# Patient Record
Sex: Female | Born: 1955
Health system: Southern US, Community
[De-identification: ages and names within clinical notes are randomized; demographics above are authoritative.]

## PROBLEM LIST (undated history)

## (undated) DIAGNOSIS — T7840XA Allergy, unspecified, initial encounter: Secondary | ICD-10-CM

## (undated) DIAGNOSIS — K219 Gastro-esophageal reflux disease without esophagitis: Secondary | ICD-10-CM

## (undated) DIAGNOSIS — IMO0002 Reserved for concepts with insufficient information to code with codable children: Secondary | ICD-10-CM

## (undated) DIAGNOSIS — D649 Anemia, unspecified: Secondary | ICD-10-CM

## (undated) DIAGNOSIS — E785 Hyperlipidemia, unspecified: Secondary | ICD-10-CM

## (undated) DIAGNOSIS — E079 Disorder of thyroid, unspecified: Secondary | ICD-10-CM

## (undated) DIAGNOSIS — R42 Dizziness and giddiness: Secondary | ICD-10-CM

## (undated) DIAGNOSIS — I10 Essential (primary) hypertension: Secondary | ICD-10-CM

## (undated) DIAGNOSIS — R87619 Unspecified abnormal cytological findings in specimens from cervix uteri: Secondary | ICD-10-CM

## (undated) HISTORY — DX: Unspecified abnormal cytological findings in specimens from cervix uteri: R87.619

## (undated) HISTORY — DX: Allergy, unspecified, initial encounter: T78.40XA

## (undated) HISTORY — PX: FOOT SURGERY: SHX648

## (undated) HISTORY — DX: Reserved for concepts with insufficient information to code with codable children: IMO0002

## (undated) HISTORY — DX: Essential (primary) hypertension: I10

## (undated) HISTORY — DX: Gastro-esophageal reflux disease without esophagitis: K21.9

## (undated) HISTORY — DX: Hyperlipidemia, unspecified: E78.5

## (undated) HISTORY — DX: Disorder of thyroid, unspecified: E07.9

## (undated) HISTORY — DX: Anemia, unspecified: D64.9

---

## 1978-12-15 HISTORY — PX: OVARY SURGERY: SHX727

## 1995-04-16 HISTORY — PX: ABDOMINAL HYSTERECTOMY: SHX81

## 2010-01-09 ENCOUNTER — Emergency Department (HOSPITAL_COMMUNITY)
Admission: EM | Admit: 2010-01-09 | Discharge: 2010-01-09 | Payer: Self-pay | Source: Home / Self Care | Admitting: Emergency Medicine

## 2011-06-01 ENCOUNTER — Encounter: Payer: Self-pay | Admitting: Family Medicine

## 2011-06-01 DIAGNOSIS — K219 Gastro-esophageal reflux disease without esophagitis: Secondary | ICD-10-CM

## 2011-06-01 DIAGNOSIS — Z889 Allergy status to unspecified drugs, medicaments and biological substances status: Secondary | ICD-10-CM | POA: Insufficient documentation

## 2011-06-01 DIAGNOSIS — I10 Essential (primary) hypertension: Secondary | ICD-10-CM

## 2011-06-01 DIAGNOSIS — E785 Hyperlipidemia, unspecified: Secondary | ICD-10-CM

## 2011-06-03 ENCOUNTER — Encounter: Payer: Self-pay | Admitting: Family Medicine

## 2011-06-03 ENCOUNTER — Ambulatory Visit (INDEPENDENT_AMBULATORY_CARE_PROVIDER_SITE_OTHER): Payer: 59 | Admitting: Family Medicine

## 2011-06-03 VITALS — BP 116/73 | HR 68 | Temp 97.9°F | Resp 16 | Ht 68.25 in | Wt 212.6 lb

## 2011-06-03 DIAGNOSIS — R1084 Generalized abdominal pain: Secondary | ICD-10-CM

## 2011-06-03 DIAGNOSIS — Z0000 Encounter for general adult medical examination without abnormal findings: Secondary | ICD-10-CM

## 2011-06-03 DIAGNOSIS — N951 Menopausal and female climacteric states: Secondary | ICD-10-CM

## 2011-06-03 DIAGNOSIS — H6980 Other specified disorders of Eustachian tube, unspecified ear: Secondary | ICD-10-CM

## 2011-06-03 DIAGNOSIS — R072 Precordial pain: Secondary | ICD-10-CM

## 2011-06-03 NOTE — Progress Notes (Signed)
  Subjective:    Patient ID: Stacy Bryan, female    DOB: 01-18-1956, 56 y.o.   MRN: [id number]  HPI Patient presents for CPE  Vertigo secondary to ETD; history of allergies with persistent PND   Review of Systems See health survey(to be scanned)    Objective:   Physical Exam  Constitutional: She is oriented to person, place, and time. She appears well-developed and well-nourished.  HENT:  Head: Normocephalic and atraumatic.  Right Ear: External ear normal.  Left Ear: External ear normal.  Nose: Mucosal edema and rhinorrhea present.  Eyes: Conjunctivae and EOM are normal. Pupils are equal, round, and reactive to light.  Neck: Neck supple. No thyromegaly present.  Cardiovascular: Normal rate, regular rhythm and normal heart sounds.   Pulmonary/Chest: Effort normal and breath sounds normal. Right breast exhibits no mass, no nipple discharge and no skin change. Left breast exhibits no mass, no nipple discharge and no skin change.  Abdominal: Soft. Bowel sounds are normal. There is no hepatosplenomegaly.  Musculoskeletal: Normal range of motion.  Neurological: She is alert and oriented to person, place, and time. She displays normal reflexes. No cranial nerve deficit.  Skin: Skin is warm.  Psychiatric: She has a normal mood and affect.          Assessment & Plan:  CPE  -Subclinical hyperthyroid -Chest pain; atypical -Epigastric-RUQ abdominal pain -Vertigo secondary to ETD -Allergies -Surgical menopause -Dyslipidemai -Gerd -FH breast cancer (2 ) sisters in their 23's  Plan/ EKG           Check labs           Strongly urged patient to get a mammogram given FH of breast cancer            In 2 first degree relatives. Patient agrees with recommendation.           Follow up with patient once labs are back.

## 2011-06-04 LAB — COMPREHENSIVE METABOLIC PANEL
ALT: 26 U/L (ref 0–35)
CO2: 28 mEq/L (ref 19–32)
Calcium: 9.3 mg/dL (ref 8.4–10.5)
Chloride: 102 mEq/L (ref 96–112)
Creat: 0.86 mg/dL (ref 0.50–1.10)
Glucose, Bld: 88 mg/dL (ref 70–99)

## 2011-06-04 LAB — CBC WITH DIFFERENTIAL/PLATELET
Eosinophils Relative: 6 % — ABNORMAL HIGH (ref 0–5)
HCT: 41.3 % (ref 36.0–46.0)
Hemoglobin: 13.2 g/dL (ref 12.0–15.0)
Lymphocytes Relative: 49 % — ABNORMAL HIGH (ref 12–46)
Lymphs Abs: 2.1 10*3/uL (ref 0.7–4.0)
MCV: 82.6 fL (ref 78.0–100.0)
Monocytes Absolute: 0.3 10*3/uL (ref 0.1–1.0)
Monocytes Relative: 7 % (ref 3–12)
Platelets: 281 10*3/uL (ref 150–400)
RBC: 5 MIL/uL (ref 3.87–5.11)
WBC: 4.2 10*3/uL (ref 4.0–10.5)

## 2011-06-04 LAB — LIPID PANEL
Cholesterol: 216 mg/dL — ABNORMAL HIGH (ref 0–200)
Total CHOL/HDL Ratio: 4.7 Ratio

## 2011-06-05 LAB — VITAMIN D 1,25 DIHYDROXY
Vitamin D2 1, 25 (OH)2: <8 pg/mL
Vitamin D3 1, 25 (OH)2: 49 pg/mL

## 2011-06-07 NOTE — Progress Notes (Signed)
Quick Note:  Please let patient know their labs were normal with the exception of her cholesterol. Patient to continue with their efforts to modify their diet and exercise 3-5 days a week. We will recheck lipids in 6 months. ______

## 2011-06-08 ENCOUNTER — Telehealth: Payer: Self-pay

## 2011-06-14 ENCOUNTER — Encounter: Payer: Self-pay | Admitting: Family Medicine

## 2011-07-01 ENCOUNTER — Ambulatory Visit (INDEPENDENT_AMBULATORY_CARE_PROVIDER_SITE_OTHER): Payer: 59 | Admitting: Family Medicine

## 2011-07-01 VITALS — BP 107/72 | HR 76 | Temp 98.3°F | Resp 18 | Ht 69.5 in | Wt 215.0 lb

## 2011-07-01 DIAGNOSIS — M545 Low back pain, unspecified: Secondary | ICD-10-CM

## 2011-07-01 MED ORDER — NAPROXEN 500 MG PO TABS: 500.0000 mg | ORAL_TABLET | Freq: Two times a day (BID) | ORAL | Status: AC

## 2011-07-01 MED ORDER — CYCLOBENZAPRINE HCL 5 MG PO TABS: 5.0000 mg | ORAL_TABLET | Freq: Three times a day (TID) | ORAL | Status: DC | PRN

## 2011-07-01 NOTE — Progress Notes (Signed)
  Subjective:    Patient ID: Stacy Bryan, female    DOB: 01-17-1956, 56 y.o.   MRN: [id number]  HPI 56 yo female with 2 weeks of back pain. Started after wearing high heels (doesn't ordinarily).  Lower pain.  NKI.  Won't go away.  There all the time, worse with standing, standing up, aand turning.  More on right than left.  Has been using flexeril at night, allow her to rest at night.  Stretching has seemed to make it worse.  Used heating pad first few nights.   Better with ibuprofen and flexeril.   No history of significant back problems but does hurt on occasion.    Review of Systems Negative except as per HPI     Objective:   Physical Exam  Constitutional: Vital signs are normal. She appears well-developed and well-nourished. She is active.  Non-toxic appearance. She does not appear ill.  Cardiovascular: Normal rate, regular rhythm, normal heart sounds and normal pulses.   Pulmonary/Chest: Effort normal and breath sounds normal.  Musculoskeletal:       Lumbar back: She exhibits tenderness and spasm. She exhibits normal range of motion, no bony tenderness, no swelling and no deformity.  Neurological: She is alert. She has normal strength. Gait normal.  Reflex Scores:      Patellar reflexes are 2+ on the right side and 2+ on the left side.      Achilles reflexes are 2+ on the right side and 2+ on the left side.         Assessment & Plan:  Low back pain, spasm - flexeril, naprosyn, heat, stretching.  If not improving in 1 week, then call and will refer to physical therapy.

## 2011-07-19 NOTE — Telephone Encounter (Signed)
Done in error.

## 2011-08-29 ENCOUNTER — Ambulatory Visit (INDEPENDENT_AMBULATORY_CARE_PROVIDER_SITE_OTHER): Payer: 59 | Admitting: Family Medicine

## 2011-08-29 VITALS — BP 124/83 | HR 77 | Temp 97.4°F | Resp 18 | Ht 69.0 in | Wt 213.0 lb

## 2011-08-29 DIAGNOSIS — J019 Acute sinusitis, unspecified: Secondary | ICD-10-CM

## 2011-08-29 MED ORDER — HYDROCODONE-HOMATROPINE 5-1.5 MG/5ML PO SYRP: 5.0000 mL | ORAL_SOLUTION | Freq: Three times a day (TID) | ORAL | Status: AC | PRN

## 2011-08-29 MED ORDER — FLUTICASONE PROPIONATE 50 MCG/ACT NA SUSP: 2.0000 | Freq: Every day | NASAL | Status: DC

## 2011-08-29 MED ORDER — AZITHROMYCIN 250 MG PO TABS: ORAL_TABLET | ORAL | Status: AC

## 2011-08-29 NOTE — Progress Notes (Signed)
  Subjective:    Patient ID: Stacy Bryan, female    DOB: 07/04/1955, 56 y.o.   MRN: [id number]  HPI 56 yo female with URI symptoms for 2 weeks.  Symptoms include head congestion, chills for last 3 days, difficulty sleeping due to coughing, sneezing.  Sore throat.  No ear symptoms.  Cough started dry but when she lays down coughs up phlegm - similar to nasal discharge.  Does have trouble with allergies.  Has used claritin D in teh past - no help.  [employer] brand helped until now.    Review of Systems Negative except as per HPI     Objective:   Physical Exam  Constitutional: She appears well-developed. No distress.  HENT:  Right Ear: Tympanic membrane, external ear and ear canal normal. Tympanic membrane is not injected, not scarred, not perforated, not erythematous, not retracted and not bulging.  Left Ear: Tympanic membrane, external ear and ear canal normal. Tympanic membrane is not injected, not scarred, not perforated, not erythematous, not retracted and not bulging.  Nose: Mucosal edema present. No rhinorrhea. Right sinus exhibits maxillary sinus tenderness and frontal sinus tenderness. Left sinus exhibits no maxillary sinus tenderness and no frontal sinus tenderness.  Mouth/Throat: Uvula is midline, oropharynx is clear and moist and mucous membranes are normal. No oropharyngeal exudate or tonsillar abscesses.  Cardiovascular: Normal rate, regular rhythm, normal heart sounds and intact distal pulses.   No murmur heard. Pulmonary/Chest: Effort normal and breath sounds normal. No respiratory distress. She has no wheezes. She has no rales.  Lymphadenopathy:       Head (right side): No submandibular and no preauricular adenopathy present.       Head (left side): No submandibular and no preauricular adenopathy present.       Right cervical: No superficial cervical and no posterior cervical adenopathy present.      Left cervical: No superficial cervical and no posterior cervical  adenopathy present.       Right: No supraclavicular adenopathy present.       Left: No supraclavicular adenopathy present.  Skin: Skin is warm and dry.          Assessment & Plan:  Sinusitis - flonase, zpak, hycodan.

## 2011-10-02 ENCOUNTER — Ambulatory Visit (INDEPENDENT_AMBULATORY_CARE_PROVIDER_SITE_OTHER): Payer: 59 | Admitting: Family Medicine

## 2011-10-02 ENCOUNTER — Ambulatory Visit: Payer: 59

## 2011-10-02 ENCOUNTER — Encounter: Payer: Self-pay | Admitting: Family Medicine

## 2011-10-02 VITALS — BP 110/77 | HR 72 | Temp 97.9°F | Resp 16 | Ht 68.0 in | Wt 213.0 lb

## 2011-10-02 DIAGNOSIS — M545 Low back pain, unspecified: Secondary | ICD-10-CM

## 2011-10-02 DIAGNOSIS — E785 Hyperlipidemia, unspecified: Secondary | ICD-10-CM

## 2011-10-02 DIAGNOSIS — E038 Other specified hypothyroidism: Secondary | ICD-10-CM | POA: Insufficient documentation

## 2011-10-02 DIAGNOSIS — E039 Hypothyroidism, unspecified: Secondary | ICD-10-CM

## 2011-10-02 LAB — T4, FREE: Free T4: 0.84 ng/dL (ref 0.80–1.80)

## 2011-10-02 NOTE — Patient Instructions (Signed)
Degenerative Disc Disease Degenerative disc disease is a condition caused by the changes that occur in the cushions of the backbone (spinal discs) as you grow older. Spinal discs are soft and compressible discs located between the bones of the spine (vertebrae). They act like shock absorbers. Degenerative disc disease can affect the wholespine. However, the neck and lower back are most commonly affected. Many changes can occur in the spinal discs with aging, such as:  The spinal discs may dry and shrink.   Small tears may occur in the tough, outer covering of the disc (annulus).   The disc space may become smaller due to loss of water.   Abnormal growths in the bone (spurs) may occur. This can put pressure on the nerve roots exiting the spinal canal, causing pain.   The spinal canal may become narrowed.  CAUSES  Degenerative disc disease is a condition caused by the changes that occur in the spinal discs with aging. The exact cause is not known, but there is a genetic basis for many patients. Degenerative changes can occur due to loss of fluid in the disc. This makes the disc thinner and reduces the space between the backbones. Small cracks can develop in the outer layer of the disc. This can lead to the breakdown of the disc. You are more likely to get degenerative disc disease if you are overweight. Smoking cigarettes and doing heavy work such as weightlifting can also increase your risk of this condition. Degenerative changes can start after a sudden injury. Growth of bone spurs can compress the nerve roots and cause pain.  SYMPTOMS  The symptoms vary from person to person. Some people may have no pain, while others have severe pain. The pain may be so severe that it can limit your activities. The location of the pain depends on the part of your backbone that is affected. You will have neck or arm pain if a disc in the neck area is affected. You will have pain in your back, buttocks, or legs if a  disc in the lower back is affected. The pain becomes worse while bending, reaching up, or with twisting movements. The pain may start gradually and then get worse as time passes. It may also start after a major or minor injury. You may feel numbness or tingling in the arms or legs.  DIAGNOSIS  Your caregiver will ask you about your symptoms and about activities or habits that may cause the pain. He or she may also ask about any injuries, diseases, ortreatments you have had earlier. Your caregiver will examine you to check for the range of movement that is possible in the affected area, to check for strength in your extremities, and to check for sensation in the areas of the arms and legs supplied by different nerve roots. An X-ray of the spine may be taken. Your caregiver may suggest other imaging tests, such as a computerized magnetic scan (MRI), if needed.  TREATMENT  Treatment includes rest, modifying your activities, and applying ice and heat. Your caregiver may prescribe medicines to reduce your pain and may ask you to do some exercises to strengthen your back. In some cases, you may need surgery. You and your caregiver will decide on the treatment that is best for you. HOME CARE INSTRUCTIONS   Follow proper lifting and walking techniques as advised by your caregiver.   Maintain good posture.   Exercise regularly as advised.   Perform relaxation exercises.   Change your sitting,   standing, and sleeping habits as advised. Change positions frequently.   Lose weight as advised.   Stop smoking if you smoke.   Wear supportive footwear.  SEEK MEDICAL CARE IF:  The pain does not go away within 1 to 4 weeks. SEEK IMMEDIATE MEDICAL CARE IF:   The pain is severe.   You notice weakness in your arms, hands, or legs.   You begin to lose control of your bladder or bowel.  MAKE SURE YOU:   Understand these instructions.   Will watch your condition.   Will get help right away if you are not  doing well or get worse.  Document Released: 01/27/2007 Document Revised: 03/21/2011 Document Reviewed: 01/27/2007 [employer] Patient Information 2012 [employer]. 

## 2011-10-02 NOTE — Progress Notes (Signed)
Subjective:    Patient ID: Stacy Bryan, female    DOB: 1955-10-27, 56 y.o.   MRN: [id number]  HPI This 56 y.o AA female has a hx of Subclinical Hypothyroidism , followed by physician in [place]  prior to first visit here with Dr. Hal Hope in Feb 2013. (her records are at [facility]). TFTs  have been "borderline". She has 2 sisters with thyroid disorder. Her symptoms include: fatigue despite adequate  sleep, skin changes, constipation and ankle swelling.         She also c/o pain down inside of R leg, onset after cardiac cath ~ 1 year ago. She has chronic LBP  mostly on right side/ posterior hip area with radiation into lower leg for 5+ years.  No hx of trauma but does recall a  fall at work years ago. Never had any xrays   Review of Systems  Constitutional: Positive for fatigue. Negative for fever, appetite change and unexpected weight change.  HENT: Negative for trouble swallowing, neck pain and voice change.   Respiratory: Negative for chest tightness and shortness of breath.   Cardiovascular: Positive for leg swelling. Negative for chest pain and palpitations.  Gastrointestinal: Positive for constipation. Negative for abdominal pain and blood in stool.  Musculoskeletal: Positive for back pain. Negative for joint swelling and arthralgias.  Neurological: Negative for dizziness, tremors, weakness, light-headedness, numbness and headaches.  Hematological: Negative.   Psychiatric/Behavioral: Negative for confusion, disturbed wake/sleep cycle and dysphoric mood. The patient is not nervous/anxious.        Objective:   Physical Exam  Nursing note and vitals reviewed. Constitutional: She is oriented to person, place, and time. She appears well-developed and well-nourished. No distress.  HENT:  Head: Normocephalic and atraumatic.  Right Ear: External ear normal.  Left Ear: External ear normal.  Nose: Nose normal.  Mouth/Throat: Oropharynx is clear and moist.    Eyes: Conjunctivae and EOM are normal. Pupils are equal, round, and reactive to light. No scleral icterus.  Neck: Normal range of motion. Neck supple. No thyromegaly present.  Cardiovascular: Normal rate, regular rhythm and normal heart sounds.  Exam reveals no gallop and no friction rub.   No murmur heard. Pulmonary/Chest: Effort normal and breath sounds normal. No respiratory distress.  Abdominal: She exhibits no mass. There is no tenderness. There is no guarding.  Musculoskeletal: Normal range of motion. She exhibits edema and tenderness.       Back: lumbosacral spine- tender R SI joint with palpation. SLR not performed - pt has vertigo when lying flat. When standing- forward flexion to 90 degrees without difficulty; cn bear weight on each leg for 5-8 seconds  Lymphadenopathy:    She has no cervical adenopathy.  Neurological: She is alert and oriented to person, place, and time. She displays abnormal reflex. No cranial nerve deficit. She exhibits normal muscle tone. Coordination normal.       DTRs: 0-1+ in UEs and LEs  Skin: Skin is warm and dry.  Psychiatric: She has a normal mood and affect. Her behavior is normal. Thought content normal.     LABS: 04/24/2011-  Free T4= 0.85-0.95     TSH= 0.195- 0.503   [facility] reading (PRIMARY) by  Dr. Audria Nine:  Lumbar spine- mild degenerative changes with spurring  involving lower lumbosacral spine; degenerative changes                                                                              of SI joints; disc spaces preserved; pronounced lordosis       Assessment & Plan:   1. Subclinical hypothyroidism  TSH, T4, Free  2. Low back pain - multifactorial (truncal obesity + poor core strength + Lumbar DDD DG Lumbar Spine 2-3 Views Await Radiology reading; consider PT and flexibility (ie Yoga or Pilates) and strength training to improve core muscles  3. Hyperlipidemia   Review Feb 2013 labs and advised nutrition modification

## 2011-10-05 ENCOUNTER — Other Ambulatory Visit: Payer: Self-pay | Admitting: Family Medicine

## 2011-10-05 DIAGNOSIS — E079 Disorder of thyroid, unspecified: Secondary | ICD-10-CM

## 2011-10-05 NOTE — Progress Notes (Signed)
Quick Note:  Please call pt and advise that the following labs are abnormal...  One of the thyroid test is a little abnormal. Best plan is to refer pt to Endocrinologist for further evaluation and best treatment going forward.  I have ordered a referral and she will be notified about an appointment.  Copy to pt.  ______

## 2011-11-13 ENCOUNTER — Ambulatory Visit: Payer: 59 | Admitting: Family Medicine

## 2012-02-27 DIAGNOSIS — Z6832 Body mass index (BMI) 32.0-32.9, adult: Secondary | ICD-10-CM | POA: Insufficient documentation

## 2012-02-27 DIAGNOSIS — Z9889 Other specified postprocedural states: Secondary | ICD-10-CM | POA: Insufficient documentation

## 2012-07-16 DIAGNOSIS — H8110 Benign paroxysmal vertigo, unspecified ear: Secondary | ICD-10-CM | POA: Insufficient documentation

## 2012-07-16 DIAGNOSIS — J302 Other seasonal allergic rhinitis: Secondary | ICD-10-CM | POA: Insufficient documentation

## 2012-09-28 ENCOUNTER — Other Ambulatory Visit: Payer: Self-pay | Admitting: Family Medicine

## 2012-09-28 NOTE — Telephone Encounter (Signed)
Needs OV, last seen June 2013

## 2012-09-29 ENCOUNTER — Other Ambulatory Visit: Payer: Self-pay | Admitting: *Deleted

## 2012-09-29 MED ORDER — FLUTICASONE PROPIONATE 50 MCG/ACT NA SUSP: 2.0000 | Freq: Every day | NASAL | Status: DC

## 2013-09-22 ENCOUNTER — Telehealth: Payer: Self-pay

## 2013-09-22 NOTE — Telephone Encounter (Signed)
Pt would like to set up an Appointment for Est. Care. Pt does have the [facility] card.

## 2013-09-29 ENCOUNTER — Telehealth: Payer: Self-pay | Admitting: Family Medicine

## 2013-09-29 NOTE — Telephone Encounter (Signed)
Left voicemail for patient to call to schedule appointment to establish care.

## 2013-10-08 ENCOUNTER — Ambulatory Visit: Payer: No Typology Code available for payment source | Attending: Internal Medicine | Admitting: Internal Medicine

## 2013-10-08 ENCOUNTER — Encounter: Payer: Self-pay | Admitting: Internal Medicine

## 2013-10-08 VITALS — BP 121/84 | HR 70 | Temp 98.3°F | Resp 16 | Ht 70.0 in | Wt 214.0 lb

## 2013-10-08 DIAGNOSIS — M545 Low back pain, unspecified: Secondary | ICD-10-CM | POA: Insufficient documentation

## 2013-10-08 DIAGNOSIS — Z1211 Encounter for screening for malignant neoplasm of colon: Secondary | ICD-10-CM

## 2013-10-08 DIAGNOSIS — Z1239 Encounter for other screening for malignant neoplasm of breast: Secondary | ICD-10-CM | POA: Insufficient documentation

## 2013-10-08 DIAGNOSIS — Z7189 Other specified counseling: Secondary | ICD-10-CM

## 2013-10-08 DIAGNOSIS — E785 Hyperlipidemia, unspecified: Secondary | ICD-10-CM | POA: Insufficient documentation

## 2013-10-08 DIAGNOSIS — Z79899 Other long term (current) drug therapy: Secondary | ICD-10-CM | POA: Insufficient documentation

## 2013-10-08 DIAGNOSIS — E038 Other specified hypothyroidism: Secondary | ICD-10-CM

## 2013-10-08 DIAGNOSIS — Z7689 Persons encountering health services in other specified circumstances: Secondary | ICD-10-CM | POA: Insufficient documentation

## 2013-10-08 DIAGNOSIS — Z7982 Long term (current) use of aspirin: Secondary | ICD-10-CM | POA: Insufficient documentation

## 2013-10-08 DIAGNOSIS — K219 Gastro-esophageal reflux disease without esophagitis: Secondary | ICD-10-CM | POA: Insufficient documentation

## 2013-10-08 DIAGNOSIS — Z87891 Personal history of nicotine dependence: Secondary | ICD-10-CM | POA: Insufficient documentation

## 2013-10-08 DIAGNOSIS — E039 Hypothyroidism, unspecified: Secondary | ICD-10-CM | POA: Insufficient documentation

## 2013-10-08 LAB — CBC WITH DIFFERENTIAL/PLATELET
BASOS PCT: 1 % (ref 0–1)
Basophils Absolute: 0 10*3/uL (ref 0.0–0.1)
EOS ABS: 0.3 10*3/uL (ref 0.0–0.7)
EOS PCT: 8 % — AB (ref 0–5)
HEMATOCRIT: 40.6 % (ref 36.0–46.0)
HEMOGLOBIN: 14 g/dL (ref 12.0–15.0)
LYMPHS ABS: 1.6 10*3/uL (ref 0.7–4.0)
Lymphocytes Relative: 43 % (ref 12–46)
MCH: 27.3 pg (ref 26.0–34.0)
MCHC: 34.5 g/dL (ref 30.0–36.0)
MCV: 79.3 fL (ref 78.0–100.0)
MONO ABS: 0.2 10*3/uL (ref 0.1–1.0)
MONOS PCT: 5 % (ref 3–12)
NEUTROS PCT: 43 % (ref 43–77)
Neutro Abs: 1.6 10*3/uL — ABNORMAL LOW (ref 1.7–7.7)
Platelets: 239 10*3/uL (ref 150–400)
RBC: 5.12 MIL/uL — AB (ref 3.87–5.11)
RDW: 14.7 % (ref 11.5–15.5)
WBC: 3.7 10*3/uL — ABNORMAL LOW (ref 4.0–10.5)

## 2013-10-08 LAB — POCT GLYCOSYLATED HEMOGLOBIN (HGB A1C): Hemoglobin A1C: 5.7

## 2013-10-08 MED ORDER — MELOXICAM 15 MG PO TABS: 15.0000 mg | ORAL_TABLET | Freq: Every day | ORAL | Status: DC

## 2013-10-08 MED ORDER — CYCLOBENZAPRINE HCL 5 MG PO TABS: 5.0000 mg | ORAL_TABLET | Freq: Three times a day (TID) | ORAL | Status: DC | PRN

## 2013-10-08 MED ORDER — HYDROCHLOROTHIAZIDE 12.5 MG PO CAPS: 12.5000 mg | ORAL_CAPSULE | Freq: Every day | ORAL | Status: DC

## 2013-10-08 NOTE — Progress Notes (Signed)
Pt here to establish care for Hx. Vertigo,HTN and lower back pain  Pt c/o intermit tailbone pain,nonradiating with worsening s/p fall at home after slipping out of bath tub in 06/2013 Pt didn't seek medical attention at that time Pain has been constant x 38mnth Pt need referral for Colonoscopy/mammogram Requesting advanced directive information Need blood work

## 2013-10-08 NOTE — Patient Instructions (Signed)
Back Pain, Adult Low back pain is very common. About 1 in 5 people have back pain.The cause of low back pain is rarely dangerous. The pain often gets better over time.About half of people with a sudden onset of back pain feel better in just 2 weeks. About 8 in 10 people feel better by 6 weeks.  CAUSES Some common causes of back pain include:  Strain of the muscles or ligaments supporting the spine.  Wear and tear (degeneration) of the spinal discs.  Arthritis.  Direct injury to the back. DIAGNOSIS Most of the time, the direct cause of low back pain is not known.However, back pain can be treated effectively even when the exact cause of the pain is unknown.Answering your caregiver's questions about your overall health and symptoms is one of the most accurate ways to make sure the cause of your pain is not dangerous. If your caregiver needs more information, he or she may order lab work or imaging tests (X-rays or MRIs).However, even if imaging tests show changes in your back, this usually does not require surgery. HOME CARE INSTRUCTIONS For many people, back pain returns.Since low back pain is rarely dangerous, it is often a condition that people can learn to manageon their own.   Remain active. It is stressful on the back to sit or stand in one place. Do not sit, drive, or stand in one place for more than 30 minutes at a time. Take short walks on level surfaces as soon as pain allows.Try to increase the length of time you walk each day.  Do not stay in bed.Resting more than 1 or 2 days can delay your recovery.  Do not avoid exercise or work.Your body is made to move.It is not dangerous to be active, even though your back may hurt.Your back will likely heal faster if you return to being active before your pain is gone.  Pay attention to your body when you bend and lift. Many people have less discomfortwhen lifting if they bend their knees, keep the load close to their bodies,and  avoid twisting. Often, the most comfortable positions are those that put less stress on your recovering back.  Find a comfortable position to sleep. Use a firm mattress and lie on your side with your knees slightly bent. If you lie on your back, put a pillow under your knees.  Only take over-the-counter or prescription medicines as directed by your caregiver. Over-the-counter medicines to reduce pain and inflammation are often the most helpful.Your caregiver may prescribe muscle relaxant drugs.These medicines help dull your pain so you can more quickly return to your normal activities and healthy exercise.  Put ice on the injured area.  Put ice in a plastic bag.  Place a towel between your skin and the bag.  Leave the ice on for 15-20 minutes, 03-04 times a day for the first 2 to 3 days. After that, ice and heat may be alternated to reduce pain and spasms.  Ask your caregiver about trying back exercises and gentle massage. This may be of some benefit.  Avoid feeling anxious or stressed.Stress increases muscle tension and can worsen back pain.It is important to recognize when you are anxious or stressed and learn ways to manage it.Exercise is a great option. SEEK MEDICAL CARE IF:  You have pain that is not relieved with rest or medicine.  You have pain that does not improve in 1 week.  You have new symptoms.  You are generally not feeling well. SEEK   IMMEDIATE MEDICAL CARE IF:   You have pain that radiates from your back into your legs.  You develop new bowel or bladder control problems.  You have unusual weakness or numbness in your arms or legs.  You develop nausea or vomiting.  You develop abdominal pain.  You feel faint. Document Released: 04/01/2005 Document Revised: 10/01/2011 Document Reviewed: 08/20/2010 [facility] Patient Information 2015 [place], [place]. This information is not intended to replace advice given to you by your health care provider. Make sure you  discuss any questions you have with your health care provider. DASH Eating Plan DASH stands for "Dietary Approaches to Stop Hypertension." The DASH eating plan is a healthy eating plan that has been shown to reduce high blood pressure (hypertension). Additional health benefits may include reducing the risk of type 2 diabetes mellitus, heart disease, and stroke. The DASH eating plan may also help with weight loss. WHAT DO I NEED TO KNOW ABOUT THE DASH EATING PLAN? For the DASH eating plan, you will follow these general guidelines:  Choose foods with a percent daily value for sodium of less than 5% (as listed on the food label).  Use salt-free seasonings or herbs instead of table salt or sea salt.  Check with your health care provider or pharmacist before using salt substitutes.  Eat lower-sodium products, often labeled as "lower sodium" or "no salt added."  Eat fresh foods.  Eat more vegetables, fruits, and low-fat dairy products.  Choose whole grains. Look for the word "whole" as the first word in the ingredient list.  Choose fish and skinless chicken or [place] more often than red meat. Limit fish, poultry, and meat to 6 oz (170 g) each day.  Limit sweets, desserts, sugars, and sugary drinks.  Choose heart-healthy fats.  Limit cheese to 1 oz (28 g) per day.  Eat more home-cooked food and less restaurant, buffet, and fast food.  Limit fried foods.  Cook foods using methods other than frying.  Limit canned vegetables. If you do use them, rinse them well to decrease the sodium.  When eating at a restaurant, ask that your food be prepared with less salt, or no salt if possible. WHAT FOODS CAN I EAT? Seek help from a dietitian for individual calorie needs. Grains Whole grain or whole wheat bread. Brown rice. Whole grain or whole wheat pasta. Quinoa, bulgur, and whole grain cereals. Low-sodium cereals. Corn or whole wheat flour tortillas. Whole grain cornbread. Whole grain crackers.  Low-sodium crackers. Vegetables Fresh or frozen vegetables (raw, steamed, roasted, or grilled). Low-sodium or reduced-sodium tomato and vegetable juices. Low-sodium or reduced-sodium tomato sauce and paste. Low-sodium or reduced-sodium canned vegetables.  Fruits All fresh, canned (in natural juice), or frozen fruits. Meat and Other Protein Products Ground beef (85% or leaner), grass-fed beef, or beef trimmed of fat. Skinless chicken or [place]. Ground chicken or [place]. Pork trimmed of fat. All fish and seafood. Eggs. Dried beans, peas, or lentils. Unsalted nuts and seeds. Unsalted canned beans. Dairy Low-fat dairy products, such as skim or 1% milk, 2% or reduced-fat cheeses, low-fat ricotta or cottage cheese, or plain low-fat yogurt. Low-sodium or reduced-sodium cheeses. Fats and Oils Tub margarines without trans fats. Light or reduced-fat mayonnaise and salad dressings (reduced sodium). Avocado. Safflower, olive, or canola oils. Natural peanut or almond butter. Other Unsalted popcorn and pretzels. The items listed above may not be a complete list of recommended foods or beverages. Contact your dietitian for more options. WHAT FOODS ARE NOT RECOMMENDED? Grains White bread.  White pasta. White rice. Refined cornbread. Bagels and croissants. Crackers that contain trans fat. Vegetables Creamed or fried vegetables. Vegetables in a cheese sauce. Regular canned vegetables. Regular canned tomato sauce and paste. Regular tomato and vegetable juices. Fruits Dried fruits. Canned fruit in light or heavy syrup. Fruit juice. Meat and Other Protein Products Fatty cuts of meat. Ribs, chicken wings, bacon, sausage, bologna, salami, chitterlings, fatback, hot dogs, bratwurst, and packaged luncheon meats. Salted nuts and seeds. Canned beans with salt. Dairy Whole or 2% milk, cream, half-and-half, and cream cheese. Whole-fat or sweetened yogurt. Full-fat cheeses or blue cheese. Nondairy creamers and whipped  toppings. Processed cheese, cheese spreads, or cheese curds. Condiments Onion and garlic salt, seasoned salt, table salt, and sea salt. Canned and packaged gravies. Worcestershire sauce. Tartar sauce. Barbecue sauce. Teriyaki sauce. Soy sauce, including reduced sodium. Steak sauce. Fish sauce. Oyster sauce. Cocktail sauce. Horseradish. Ketchup and mustard. Meat flavorings and tenderizers. Bouillon cubes. Hot sauce. Tabasco sauce. Marinades. Taco seasonings. Relishes. Fats and Oils Butter, stick margarine, lard, shortening, ghee, and bacon fat. Coconut, palm kernel, or palm oils. Regular salad dressings. Other Pickles and olives. Salted popcorn and pretzels. The items listed above may not be a complete list of foods and beverages to avoid. Contact your dietitian for more information. WHERE CAN I FIND MORE INFORMATION? National Heart, Lung, and Blood Institute: travelstabloid.com Document Released: 03/21/2011 Document Revised: 04/06/2013 Document Reviewed: 02/03/2013 [facility] Patient Information 2015 [place], [place]. This information is not intended to replace advice given to you by your health care provider. Make sure you discuss any questions you have with your health care provider.

## 2013-10-08 NOTE — Progress Notes (Signed)
Patient ID: Stacy Bryan, female   DOB: 12-18-1955, 58 y.o.   MRN: [id number]   Stacy Bryan, is a 58 y.o. female  [id number]  [id number]  DOB - June 27, 1955  CC:  Chief Complaint  Patient presents with  . Establish Care  . Back Pain  . Dizziness       HPI: Stacy Bryan is a 58 y.o. female here today to establish medical care. Very pleasant middle-age woman with history of subclinical hypothyroidism, dyslipidemia, leg edema and GERD, here today requesting for physical examination. She has no complaint or concern, the only concern is that she has not been to Dr. for a long time due to lack of insurance. Her last mammogram was more than 2 years ago, and her last colonoscopy was over 10 years. She had total abdominal hysterectomy over 20 years ago and was told she does not need Pap smear. She takes 12.5 mg of hydrochlorothiazide by mouth for occasionally leg swelling, she is to remain she has no personal history of hypertension or diabetes, she's not on medication for hypothyroidism, she only has regular thyroid function test, she quit smoking 6 years ago after about a 30-pack-year of smoking. She drinks alcohol occasionally. Patient fell in the bathtub her on March of this year and since then has had pain in her lower back. It is not radiating, repaired about 6/10, relieved by pain medication like Tylenol, aggravated by walking. No tingling or numbness. No urinary incontinence no fecal incontinence. Patient has No headache, No chest pain, No abdominal pain - No Nausea, No new weakness tingling or numbness, No Cough - SOB.  Not on File Past Medical History  Diagnosis Date  . Abnormal Pap smear   . Hypertension   . Thyroid disease   . Allergy   . Hyperlipidemia    Current Outpatient Prescriptions on File Prior to Visit  Medication Sig Dispense Refill  . aspirin 81 MG tablet Take 81 mg by mouth daily.      . calcium carbonate (TITRALAC) 420 MG CHEW Chew 420 mg by mouth.      .  fish oil-omega-3 fatty acids 1000 MG capsule Take 2 g by mouth daily.      . fluticasone (FLONASE) 50 MCG/ACT nasal spray Place 2 sprays into the nose daily. NEED VISIT!  16 g  0  . omeprazole (PRILOSEC) 20 MG capsule Take 20 mg by mouth daily.      . pravastatin (PRAVACHOL) 20 MG tablet Take 20 mg by mouth daily.       No current facility-administered medications on file prior to visit.   Family History  Problem Relation Age of Onset  . Hypertension Mother   . Heart disease Father   . Asthma Father   . Asthma Sister   . Diabetes Sister   . Heart disease Sister   . Gout Sister   . Ulcers Sister   . Cancer Maternal Grandmother    History   Social History  . Marital Status: Single    Spouse Name: N/A    Number of Children: N/A  . Years of Education: N/A   Occupational History  . Not on file.   Social History Main Topics  . Smoking status: Former Smoker    Quit date: 06/02/2006  . Smokeless tobacco: Not on file  . Alcohol Use: Yes     Comment: occassionally  . Drug Use: Not on file  . Sexual Activity: Not on file   Other Topics Concern  .  Not on file   Social History Narrative  . No narrative on file    Review of Systems: Constitutional: Negative for fever, chills, diaphoresis, activity change, appetite change and fatigue. HENT: Negative for ear pain, nosebleeds, congestion, facial swelling, rhinorrhea, neck pain, neck stiffness and ear discharge.  Eyes: Negative for pain, discharge, redness, itching and visual disturbance. Respiratory: Negative for cough, choking, chest tightness, shortness of breath, wheezing and stridor.  Cardiovascular: Negative for chest pain, palpitations and leg swelling. Gastrointestinal: Negative for abdominal distention. Genitourinary: Negative for dysuria, urgency, frequency, hematuria, flank pain, decreased urine volume, difficulty urinating and dyspareunia.  Musculoskeletal: Negative for back pain, joint swelling, arthralgia and gait  problem. Neurological: Negative for dizziness, tremors, seizures, syncope, facial asymmetry, speech difficulty, weakness, light-headedness, numbness and headaches.  Hematological: Negative for adenopathy. Does not bruise/bleed easily. Psychiatric/Behavioral: Negative for hallucinations, behavioral problems, confusion, dysphoric mood, decreased concentration and agitation.    Objective:   Filed Vitals:   10/08/13 1042  BP: 121/84  Pulse: 70  Temp: 98.3 F (36.8 C)  Resp: 16    Physical Exam: Constitutional: Patient appears well-developed and well-nourished. No distress. HENT: Normocephalic, atraumatic, External right and left ear normal. Oropharynx is clear and moist.  Eyes: Conjunctivae and EOM are normal. PERRLA, no scleral icterus. Neck: Normal ROM. Neck supple. No JVD. No tracheal deviation. No thyromegaly. CVS: RRR, S1/S2 +, no murmurs, no gallops, no carotid bruit.  Pulmonary: Effort and breath sounds normal, no stridor, rhonchi, wheezes, rales.  Abdominal: Soft. BS +, no distension, tenderness, rebound or guarding.  Musculoskeletal: Normal range of motion. No edema and no tenderness.  Lymphadenopathy: No lymphadenopathy noted, cervical, inguinal or axillary Neuro: Alert. Normal reflexes, muscle tone coordination. No cranial nerve deficit. Skin: Skin is warm and dry. No rash noted. Not diaphoretic. No erythema. No pallor. Psychiatric: Normal mood and affect. Behavior, judgment, thought content normal.  Lab Results  Component Value Date   WBC 4.2 06/03/2011   HGB 13.2 06/03/2011   HCT 41.3 06/03/2011   MCV 82.6 06/03/2011   PLT 281 06/03/2011   Lab Results  Component Value Date   CREATININE 0.86 06/03/2011   BUN 15 06/03/2011   NA 139 06/03/2011   K 3.8 06/03/2011   CL 102 06/03/2011   CO2 28 06/03/2011    No results found for this basename: HGBA1C   Lipid Panel     Component Value Date/Time   CHOL 216* 06/03/2011 1515   TRIG 170* 06/03/2011 1515   HDL 46 06/03/2011 1515    CHOLHDL 4.7 06/03/2011 1515   VLDL 34 06/03/2011 1515   LDLCALC 136* 06/03/2011 1515       Assessment and plan:   1. Other specified hypothyroidism  - TSH - T4, Free  2. Encounter to establish care  - HM COLONOSCOPY - Ambulatory referral to Gastroenterology - CBC with Differential - COMPLETE METABOLIC PANEL WITH GFR - POCT glycosylated hemoglobin (Hb A1C) - Lipid panel - Urinalysis, Complete  3. Midline low back pain without sciatica  - Vit D  25 hydroxy (rtn osteoporosis monitoring) - cyclobenzaprine (FLEXERIL) 5 MG tablet; Take 1 tablet (5 mg total) by mouth 3 (three) times daily as needed.  Dispense: 30 tablet; Refill: 1 - hydrochlorothiazide (MICROZIDE) 12.5 MG capsule; Take 1 capsule (12.5 mg total) by mouth daily.  Dispense: 30 capsule; Refill: 3 - meloxicam (MOBIC) 15 MG tablet; Take 1 tablet (15 mg total) by mouth daily.  Dispense: 30 tablet; Refill: 0  4. Breast cancer screening  -  MM DIGITAL SCREENING BILATERAL; Future  5. Colon cancer screening  - HM COLONOSCOPY - Ambulatory referral to Gastroenterology  Patient was counseled extensively about nutrition and exercise  Return in about 6 months (around 04/09/2014), or if symptoms worsen or fail to improve, for Routine Follow Up, Hypothyroidism.  The patient was given clear instructions to go to ER or return to medical center if symptoms don't improve, worsen or new problems develop. The patient verbalized understanding. The patient was told to call to get lab results if they haven't heard anything in the next week.     This note has been created with Surveyor, quantity. Any transcriptional errors are unintentional.    Angelica Chessman, MD, [place], [place], [place], [place]   10/08/2013, 11:33 AM

## 2013-10-09 LAB — COMPLETE METABOLIC PANEL WITH GFR
ALK PHOS: 85 U/L (ref 39–117)
ALT: 18 U/L (ref 0–35)
AST: 14 U/L (ref 0–37)
Albumin: 4.4 g/dL (ref 3.5–5.2)
BILIRUBIN TOTAL: 0.5 mg/dL (ref 0.2–1.2)
BUN: 17 mg/dL (ref 6–23)
CO2: 27 meq/L (ref 19–32)
CREATININE: 0.87 mg/dL (ref 0.50–1.10)
Calcium: 10 mg/dL (ref 8.4–10.5)
Chloride: 101 mEq/L (ref 96–112)
GFR, EST AFRICAN AMERICAN: 84 mL/min
GFR, EST NON AFRICAN AMERICAN: 73 mL/min
GLUCOSE: 98 mg/dL (ref 70–99)
Potassium: 3.8 mEq/L (ref 3.5–5.3)
Sodium: 137 mEq/L (ref 135–145)
TOTAL PROTEIN: 7.8 g/dL (ref 6.0–8.3)

## 2013-10-09 LAB — T4, FREE: Free T4: 0.82 ng/dL (ref 0.80–1.80)

## 2013-10-09 LAB — LIPID PANEL
Cholesterol: 247 mg/dL — ABNORMAL HIGH (ref 0–200)
HDL: 51 mg/dL (ref >39)
LDL CALC: 161 mg/dL — AB (ref 0–99)
TRIGLYCERIDES: 173 mg/dL — AB (ref <150)
Total CHOL/HDL Ratio: 4.8 Ratio
VLDL: 35 mg/dL (ref 0–40)

## 2013-10-09 LAB — URINALYSIS, COMPLETE
BILIRUBIN URINE: NEGATIVE
Bacteria, UA: NONE SEEN
CASTS: NONE SEEN
CRYSTALS: NONE SEEN
Glucose, UA: NEGATIVE mg/dL
Hgb urine dipstick: NEGATIVE
KETONES UR: NEGATIVE mg/dL
Leukocytes, UA: NEGATIVE
NITRITE: NEGATIVE
PH: 6.5 (ref 5.0–8.0)
Protein, ur: NEGATIVE mg/dL
SPECIFIC GRAVITY, URINE: 1.014 (ref 1.005–1.030)
SQUAMOUS EPITHELIAL / LPF: NONE SEEN
UROBILINOGEN UA: 0.2 mg/dL (ref 0.0–1.0)

## 2013-10-09 LAB — VITAMIN D 25 HYDROXY (VIT D DEFICIENCY, FRACTURES): Vit D, 25-Hydroxy: 58 ng/mL (ref 30–89)

## 2013-10-09 LAB — TSH: TSH: 0.495 u[IU]/mL (ref 0.350–4.500)

## 2013-10-13 ENCOUNTER — Encounter: Payer: Self-pay | Admitting: Internal Medicine

## 2013-10-21 ENCOUNTER — Telehealth: Payer: Self-pay

## 2013-10-21 NOTE — Telephone Encounter (Signed)
Called patient in reference to staff message left in in-box Patient not available

## 2013-10-28 ENCOUNTER — Telehealth: Payer: Self-pay

## 2013-10-28 NOTE — Telephone Encounter (Signed)
Message copied by Dorothe Pea on Thu Oct 28, 2013 10:21 AM ------      Message from: Angelica Chessman E      Created: Thu Oct 28, 2013  9:07 AM       Please inform patient that her laboratory tests results are mostly within normal limits except for her cholesterol level. We encouraged her to continue the medication for cholesterol and also regular physical exercise at least 3 times a week 30 minutes each time ------

## 2013-10-28 NOTE — Telephone Encounter (Signed)
Patient not available Left message on voice mail to return our call 

## 2013-11-17 ENCOUNTER — Telehealth: Payer: Self-pay | Admitting: Internal Medicine

## 2013-11-17 NOTE — Telephone Encounter (Signed)
Pt needs refill for Flonase  Please f/u with Pt

## 2013-11-30 ENCOUNTER — Other Ambulatory Visit: Payer: Self-pay | Admitting: *Deleted

## 2013-11-30 ENCOUNTER — Telehealth: Payer: Self-pay | Admitting: *Deleted

## 2013-11-30 MED ORDER — FLUTICASONE PROPIONATE 50 MCG/ACT NA SUSP: 2.0000 | Freq: Every day | NASAL | Status: DC

## 2013-11-30 NOTE — Telephone Encounter (Signed)
Left VM instructing the pt to pick up the RX at our pharmacy.

## 2013-12-08 ENCOUNTER — Ambulatory Visit: Payer: No Typology Code available for payment source | Admitting: Family Medicine

## 2013-12-09 ENCOUNTER — Ambulatory Visit: Payer: No Typology Code available for payment source | Admitting: Internal Medicine

## 2014-03-03 ENCOUNTER — Telehealth: Payer: Self-pay | Admitting: Emergency Medicine

## 2014-03-03 ENCOUNTER — Other Ambulatory Visit: Payer: Self-pay | Admitting: Internal Medicine

## 2014-03-03 ENCOUNTER — Other Ambulatory Visit: Payer: Self-pay | Admitting: Emergency Medicine

## 2014-03-03 MED ORDER — FLUTICASONE PROPIONATE 50 MCG/ACT NA SUSP: 2.0000 | Freq: Every day | NASAL | Status: DC

## 2014-03-03 NOTE — Telephone Encounter (Signed)
Pt comes in for Flonase refill Pt informed this will be the last time giving refill until seen by provider Last OV 10/08/13

## 2014-03-09 ENCOUNTER — Other Ambulatory Visit: Payer: Self-pay | Admitting: Internal Medicine

## 2014-03-17 ENCOUNTER — Ambulatory Visit: Payer: Self-pay | Admitting: Internal Medicine

## 2014-07-04 ENCOUNTER — Encounter: Payer: Self-pay | Admitting: Internal Medicine

## 2014-07-04 ENCOUNTER — Ambulatory Visit: Payer: Self-pay | Attending: Internal Medicine | Admitting: Internal Medicine

## 2014-07-04 VITALS — BP 136/85 | HR 59 | Temp 97.6°F | Resp 16 | Ht 70.0 in | Wt 220.0 lb

## 2014-07-04 DIAGNOSIS — Z1211 Encounter for screening for malignant neoplasm of colon: Secondary | ICD-10-CM

## 2014-07-04 DIAGNOSIS — J320 Chronic maxillary sinusitis: Secondary | ICD-10-CM

## 2014-07-04 DIAGNOSIS — Z0000 Encounter for general adult medical examination without abnormal findings: Secondary | ICD-10-CM

## 2014-07-04 DIAGNOSIS — M545 Low back pain, unspecified: Secondary | ICD-10-CM

## 2014-07-04 DIAGNOSIS — E785 Hyperlipidemia, unspecified: Secondary | ICD-10-CM

## 2014-07-04 MED ORDER — HYDROCHLOROTHIAZIDE 12.5 MG PO CAPS: 12.5000 mg | ORAL_CAPSULE | Freq: Every day | ORAL | Status: DC

## 2014-07-04 MED ORDER — SIMVASTATIN 20 MG PO TABS: 20.0000 mg | ORAL_TABLET | Freq: Every day | ORAL | Status: DC

## 2014-07-04 MED ORDER — FLUTICASONE PROPIONATE 50 MCG/ACT NA SUSP: 2.0000 | Freq: Every day | NASAL | Status: DC

## 2014-07-04 MED ORDER — CYCLOBENZAPRINE HCL 5 MG PO TABS: 5.0000 mg | ORAL_TABLET | Freq: Three times a day (TID) | ORAL | Status: DC

## 2014-07-04 NOTE — Patient Instructions (Signed)
Groin Strain °A groin strain (also called a groin pull) is an injury to the muscles or tendon on the upper inner part of the thigh. These muscles are called the adductor muscles or groin muscles. They are responsible for moving the leg across the body. A muscle strain occurs when a muscle is overstretched and some muscle fibers are torn. A groin strain can range from mild to severe depending on how many muscle fibers are affected and whether the muscle fibers are partially or completely torn.  °Groin strains usually occur during exercise or participation in sports. The injury often happens when a sudden, violent force is placed on a muscle, stretching the muscle too far. A strain is more likely to occur when your muscles are not warmed up or if you are not properly conditioned. Depending on the severity of the groin strain, recovery time may vary from a few weeks to several weeks. Severe injuries often require 4-6 weeks for recovery. In these cases, complete healing can take 4-5 months.  °CAUSES  °· Stretching the groin muscles too far or too suddenly, often during side-to-side motion with an abrupt change in direction. °· Putting repeated stress on the groin muscles over a long period of time. °· Performing vigorous activity without properly stretching the groin muscles beforehand. °SYMPTOMS  °· Pain and tenderness in the groin area. This begins as sharp pain and persists as a dull ache. °· Popping or snapping feeling when the injury occurs (for severe strains). °· Swelling or bruising. °· Muscle spasms. °· Weakness in the leg. °· Stiffness in the groin area with decreased ability to move the affected muscles. °DIAGNOSIS  °Your caregiver will perform a physical exam to diagnose a groin strain. You will be asked about your symptoms and how the injury occurred. X-rays are sometimes needed to rule out a broken bone or cartilage problems. Your caregiver may order a CT scan or MRI if a complete muscle tear is  suspected. °TREATMENT  °A groin strain will often heal on its own. Your caregiver may prescribe medicines to help manage pain and swelling (anti-inflammatory medicine). You may be told to use crutches for the first few days to minimize your pain. °HOME CARE INSTRUCTIONS  °· Rest. Do not use the strained muscle if it causes pain. °· Put ice on the injured area. °¨ Put ice in a plastic bag. °¨ Place a towel between your skin and the bag. °¨ Leave the ice on for 15-20 minutes, every 2-3 hours. Do this for the first 2 days after the injury.  °· Only take over-the-counter or prescription medicines as directed by your caregiver. °· Wrap the injured area with an elastic bandage as directed by your caregiver. °· Keep the injured leg raised (elevated). °· Walk, stretch, and perform range-of-motion exercises to improve blood flow to the injured area. Only perform these activities if you can do so without any pain. °To prevent muscle strains: °· Warm up before exercise. °· Develop proper conditioning and strength in the groin muscles. °SEEK IMMEDIATE MEDICAL CARE IF:  °· You have increased pain or swelling in the affected area.   °· Your symptoms are not improving or are getting worse. °MAKE SURE YOU:  °· Understand these instructions. °· Will watch your condition. °· Will get help right away if you are not doing well or get worse. °Document Released: 11/28/2003 Document Revised: 03/18/2012 Document Reviewed: 12/04/2011 °Exit[employer]® Patient Information ©2015 [employer]. This information is not intended to replace advice given to you   by your health care provider. Make sure you discuss any questions you have with your health care provider. ° °

## 2014-07-04 NOTE — Progress Notes (Signed)
Patient ID: [name], female   DOB: Feb 15, 1956, 59 y.o.   MRN: [id number]   Venus Gilles, is a 59 y.o. female  QMG:[id number]  [id number]  DOB - 07/26/1955  Chief Complaint  Patient presents with  . Follow-up  . Medication Refill  . Numbness        Subjective:   Sayde Lish is a 59 y.o. female here today for a follow up visit. Patient has history of subclinical hypothyroidism, dyslipidemia, GERD and chronic leg edema. She is here today for her routine follow-up and medication refills. She is complaining of feeling nonspecific groin pain radiating to her leg ongoing for about 3 months and right side. She describes the pain as pulling, no history of trauma or fall. Both legs still have some edema. She denies any new complaint today except as above. She is due for mammogram, colonoscopy and Pap smear. Patient has No headache, No chest pain, No abdominal pain - No Nausea, No new weakness tingling or numbness, No Cough - SOB.  Problem  Preventative Health Care  Chronic Maxillary Sinusitis    ALLERGIES: Not on File  PAST MEDICAL HISTORY: Past Medical History  Diagnosis Date  . Abnormal Pap smear   . Hypertension   . Thyroid disease   . Allergy   . Hyperlipidemia     MEDICATIONS AT HOME: Prior to Admission medications   Medication Sig Start Date End Date Taking? Authorizing Provider  aspirin 81 MG tablet Take 81 mg by mouth daily.   Yes Historical Provider, MD  calcium carbonate (TITRALAC) 420 MG CHEW Chew 420 mg by mouth.   Yes Historical Provider, MD  cyclobenzaprine (FLEXERIL) 5 MG tablet Take 1 tablet (5 mg total) by mouth 3 (three) times daily. 07/04/14  Yes Tresa Garter, MD  fish oil-omega-3 fatty acids 1000 MG capsule Take 2 g by mouth daily.   Yes Historical Provider, MD  fluticasone (FLONASE) 50 MCG/ACT nasal spray Place 2 sprays into both nostrils daily. NEED VISIT! 07/04/14  Yes Tresa Garter, MD  hydrochlorothiazide (MICROZIDE) 12.5 MG  capsule Take 1 capsule (12.5 mg total) by mouth daily. 07/04/14  Yes Tresa Garter, MD  omeprazole (PRILOSEC) 20 MG capsule Take 20 mg by mouth daily.   Yes Historical Provider, MD  meloxicam (MOBIC) 15 MG tablet TAKE 1 TABLET BY MOUTH DAILY. Patient not taking: Reported on 07/04/2014 04/05/14   Tresa Garter, MD  pravastatin (PRAVACHOL) 20 MG tablet Take 20 mg by mouth daily.    Historical Provider, MD  simvastatin (ZOCOR) 20 MG tablet Take 1 tablet (20 mg total) by mouth at bedtime. 07/04/14   Tresa Garter, MD     Objective:   Filed Vitals:   07/04/14 1017  BP: 136/85  Pulse: 59  Temp: 97.6 F (36.4 C)  TempSrc: Oral  Resp: 16  Height: 5\' 10"  (1.778 m)  Weight: 220 lb (99.791 kg)  SpO2: 97%    Exam General appearance : Awake, alert, not in any distress. Speech Clear. Not toxic looking HEENT: Atraumatic and Normocephalic, pupils equally reactive to light and accomodation Neck: supple, no JVD. No cervical lymphadenopathy.  Chest:Good air entry bilaterally, no added sounds  CVS: S1 S2 regular, no murmurs.  Abdomen: Bowel sounds present, Non tender and not distended with no gaurding, rigidity or rebound. Extremities: B/L Lower Ext shows no edema, both legs are warm to touch Neurology: Awake alert, and oriented X 3, CN II-XII intact, Non focal Skin:No Rash  Data Review  Lab Results  Component Value Date   HGBA1C 5.7 10/08/2013     Assessment & Plan   1. Midline low back pain without sciatica  - cyclobenzaprine (FLEXERIL) 5 MG tablet; Take 1 tablet (5 mg total) by mouth 3 (three) times daily.  Dispense: 30 tablet; Refill: 3 - hydrochlorothiazide (MICROZIDE) 12.5 MG capsule; Take 1 capsule (12.5 mg total) by mouth daily.  Dispense: 30 capsule; Refill: 3  2. Colon cancer screening  - HM COLONOSCOPY - Ambulatory referral to Gastroenterology for Colonoscopy  3. Preventative health care  - MM Digital Screening; Future - HM COLONOSCOPY - Ambulatory  referral to Gastroenterology for Colonoscopy  4. Dyslipidemia  - simvastatin (ZOCOR) 20 MG tablet; Take 1 tablet (20 mg total) by mouth at bedtime.  Dispense: 90 tablet; Refill: 3  To address this please limit saturated fat to no more than 7% of your calories, limit cholesterol to 200 mg/day, increase fiber and exercise as tolerated. If needed we may add another cholesterol lowering medication to your regimen.   5. Chronic maxillary sinusitis  - fluticasone (FLONASE) 50 MCG/ACT nasal spray; Place 2 sprays into both nostrils daily. NEED VISIT!  Dispense: 16 g; Refill: 3  Patient is to schedule Pap smear with our female provider.  Patient have been counseled extensively about nutrition and exercise Return in about 6 months (around 01/04/2015), or if symptoms worsen or fail to improve, for Follow up Pain and comorbidities, Routine Follow Up.  The patient was given clear instructions to go to ER or return to medical center if symptoms don't improve, worsen or new problems develop. The patient verbalized understanding. The patient was told to call to get lab results if they haven't heard anything in the next week.   This note has been created with Surveyor, quantity. Any transcriptional errors are unintentional.    Angelica Chessman, MD, [place], [place], [place], [place] and [facility] [place], [place]   07/04/2014, 11:11 AM

## 2014-07-04 NOTE — Progress Notes (Signed)
Pt here to f/u with medication refills  C/o right groin pulling pain radiating to leg x 3 mnths  States the pain getting worse over the last month  Swelling noted in both feet. Denies chest pain or sob Declined flu vaccine Health Maintenance Pap smear Mammogram Possible colonoscopy

## 2014-07-25 ENCOUNTER — Other Ambulatory Visit: Payer: Self-pay | Admitting: Internal Medicine

## 2014-11-09 ENCOUNTER — Telehealth: Payer: Self-pay | Admitting: Internal Medicine

## 2014-11-09 NOTE — Telephone Encounter (Signed)
Pt has a rash on top of right shoulder and is the size of a silver dollar and is round and red. The rash has been going on for about 4 days. Please follow up with pt for advise as we do not have any appt soon. Thank you.

## 2014-11-09 NOTE — Telephone Encounter (Signed)
Patient reports having vertigo for 20 years. Has not had any occurences recently. Currently on day 3 of vertigo. Patient has rash on right shoulder, round a little bigger than a silver dollar. Rash started 3-4 days ago, started out small and is getting larger. Patient denies fever.  Patient scheduled for appointment to be seen at [facility] tomorrow morning at 9am.

## 2014-11-10 ENCOUNTER — Encounter: Payer: Self-pay | Admitting: Family Medicine

## 2014-11-10 ENCOUNTER — Ambulatory Visit: Payer: 59 | Attending: Family Medicine | Admitting: Family Medicine

## 2014-11-10 VITALS — BP 119/81 | HR 61 | Temp 97.6°F | Resp 18 | Ht 70.0 in | Wt 217.0 lb

## 2014-11-10 DIAGNOSIS — B354 Tinea corporis: Secondary | ICD-10-CM | POA: Insufficient documentation

## 2014-11-10 DIAGNOSIS — E785 Hyperlipidemia, unspecified: Secondary | ICD-10-CM | POA: Insufficient documentation

## 2014-11-10 DIAGNOSIS — Z79899 Other long term (current) drug therapy: Secondary | ICD-10-CM | POA: Diagnosis not present

## 2014-11-10 DIAGNOSIS — H8113 Benign paroxysmal vertigo, bilateral: Secondary | ICD-10-CM | POA: Diagnosis not present

## 2014-11-10 DIAGNOSIS — Z87891 Personal history of nicotine dependence: Secondary | ICD-10-CM | POA: Insufficient documentation

## 2014-11-10 DIAGNOSIS — I10 Essential (primary) hypertension: Secondary | ICD-10-CM | POA: Insufficient documentation

## 2014-11-10 DIAGNOSIS — R42 Dizziness and giddiness: Secondary | ICD-10-CM | POA: Diagnosis not present

## 2014-11-10 DIAGNOSIS — R21 Rash and other nonspecific skin eruption: Secondary | ICD-10-CM | POA: Diagnosis present

## 2014-11-10 DIAGNOSIS — K219 Gastro-esophageal reflux disease without esophagitis: Secondary | ICD-10-CM | POA: Diagnosis not present

## 2014-11-10 DIAGNOSIS — Z7982 Long term (current) use of aspirin: Secondary | ICD-10-CM | POA: Diagnosis not present

## 2014-11-10 DIAGNOSIS — E079 Disorder of thyroid, unspecified: Secondary | ICD-10-CM | POA: Diagnosis not present

## 2014-11-10 MED ORDER — MECLIZINE HCL 32 MG PO TABS
32.0000 mg | ORAL_TABLET | Freq: Three times a day (TID) | ORAL | Status: DC | PRN
Start: 2014-11-10 — End: 2014-11-10

## 2014-11-10 MED ORDER — CLOTRIMAZOLE 1 % EX CREA: 1.0000 "application " | TOPICAL_CREAM | Freq: Two times a day (BID) | CUTANEOUS | Status: DC

## 2014-11-10 MED ORDER — MECLIZINE HCL 25 MG PO TABS: 25.0000 mg | ORAL_TABLET | Freq: Three times a day (TID) | ORAL | Status: DC | PRN

## 2014-11-10 NOTE — Progress Notes (Signed)
Patient here for vertigo, says she is better today, but cannot make any sudden moves. Patient reports current vertigo episode started Monday and has not gone away yet.  Most of her vertigo episodes last a few days, if they are bad they will last 1-2 weeks.  Patient also reports rash on right shoulder that has been present for 4 days.  Patient states it started off as the size of a penny/nickel, and has gotten worse. Patient states it is tender and itches.  Patient reports using OTC hydrocortisone cream for the itch, patient states it helps but she is using it all day.  Patient reports Flonase triggering vertigo episodes. Patient also has bilateral feet swelling for the past months.  Patient reports Dr. Doreene Burke has assessed this issue in the past.

## 2014-11-10 NOTE — Progress Notes (Signed)
Subjective:    Patient ID: Stacy Bryan, female    DOB: 1955/08/29, 59 y.o.   MRN: [id number]  HPI 59 year old female patient with a history of hypertension, GERD, seasonal allergies who comes in for an acute visit complaining of a rash on her right shoulder for the last 4 days which is itchy and has increased in size. Denies the presence of rash in other body areas and also denies recent introduction of new soaps or creams  She also complains of vertigo for the last 1-2 weeks; she has a chronic history of vertigo which resolved a while ago but has now returned and seems to worsen with the use of Flonase. Symptoms have worsened to the fact that she has not been able to go out over the last 2 days. Denies hearing loss, ear pain. Of note she does suffer from seasonal allergies; she denies the presence of fever.  Past Medical History  Diagnosis Date  . Abnormal Pap smear   . Hypertension   . Thyroid disease   . Allergy   . Hyperlipidemia     Past Surgical History  Procedure Laterality Date  . Foot surgery      both feet  20 yrs ago  . Abdominal hysterectomy  1997    total  . Ovary surgery  1980's    removal of R ovary (cyst)    History   Social History  . Marital Status: Single    Spouse Name: N/A  . Number of Children: N/A  . Years of Education: N/A   Occupational History  . Not on file.   Social History Main Topics  . Smoking status: Former Smoker    Quit date: 06/02/2006  . Smokeless tobacco: Not on file  . Alcohol Use: No     Comment: occassionally  . Drug Use: No  . Sexual Activity: Not on file   Other Topics Concern  . Not on file   Social History Narrative    Not on File  Current Outpatient Prescriptions on File Prior to Visit  Medication Sig Dispense Refill  . aspirin 81 MG tablet Take 81 mg by mouth daily.    . calcium carbonate (TITRALAC) 420 MG CHEW Chew 420 mg by mouth.    . cyclobenzaprine (FLEXERIL) 5 MG tablet Take 1 tablet (5 mg total)  by mouth 3 (three) times daily. 30 tablet 3  . fish oil-omega-3 fatty acids 1000 MG capsule Take 2 g by mouth daily.    . hydrochlorothiazide (MICROZIDE) 12.5 MG capsule Take 1 capsule (12.5 mg total) by mouth daily. 30 capsule 3  . meloxicam (MOBIC) 15 MG tablet TAKE 1 TABLET BY MOUTH DAILY. 30 tablet 0  . pravastatin (PRAVACHOL) 20 MG tablet Take 20 mg by mouth daily.    . fluticasone (FLONASE) 50 MCG/ACT nasal spray Place 2 sprays into both nostrils daily. NEED VISIT! (Patient not taking: Reported on 11/10/2014) 16 g 3  . omeprazole (PRILOSEC) 20 MG capsule Take 20 mg by mouth daily.    . simvastatin (ZOCOR) 20 MG tablet Take 1 tablet (20 mg total) by mouth at bedtime. (Patient not taking: Reported on 11/10/2014) 90 tablet 3   No current facility-administered medications on file prior to visit.    Review of Systems  Constitutional: Negative for activity change and appetite change.  HENT: Negative for ear discharge, ear pain and sore throat.        Positive for vertigo.  Respiratory: Negative for chest tightness, shortness of  breath and wheezing.   Cardiovascular: Positive for leg swelling (Pedal edema). Negative for chest pain.  Gastrointestinal: Negative for abdominal pain, constipation and abdominal distention.  Genitourinary: Negative.   Skin:       See history of present illness  Psychiatric/Behavioral: Negative for behavioral problems and dysphoric mood.       Objective: Filed Vitals:   11/10/14 0906  BP: 119/81  Pulse: 61  Temp: 97.6 F (36.4 C)  TempSrc: Oral  Resp: 18  Height: 5\' 10"  (1.778 m)  Weight: 217 lb (98.431 kg)  SpO2: 99%      Physical Exam  Constitutional: She is oriented to person, place, and time. She appears well-developed and well-nourished.  Cardiovascular: Normal rate, normal heart sounds and intact distal pulses.   No murmur heard. Pulmonary/Chest: Effort normal and breath sounds normal. She has no wheezes. She has no rales. She exhibits no  tenderness.  Abdominal: Soft. Bowel sounds are normal. She exhibits no distension and no mass. There is no tenderness.  Musculoskeletal: Normal range of motion. She exhibits edema (1+ nonpitting pedal edema, left greater than right.).  Neurological: She is alert and oriented to person, place, and time.  Skin:  Right shoulder with well Circumscribed tenia rash with mild erythema and central clearing measuring 5 x 5 cm.          Assessment & Plan:  59 year old female patient with a history of hypertension, GERD, seasonal allergies with vertigo and rash and shoulder suspicious for tinea infection.  Tinea corporis: Placed on topical antifungal Advised against sharing clothing.  Vertigo: Due to recurrent nature I'm referring her for vestibular exercises meanwhile I will place her on meclizine. Will need to follow-up with PCP with respect to monitoring of symptoms.  HCM: Scheduled for mammogram next week as well as colonoscopy.  This note has been created with Surveyor, quantity. Any transcriptional errors are unintentional.

## 2014-12-14 ENCOUNTER — Ambulatory Visit (HOSPITAL_COMMUNITY): Payer: 59

## 2014-12-16 ENCOUNTER — Ambulatory Visit (HOSPITAL_COMMUNITY)
Admission: RE | Admit: 2014-12-16 | Discharge: 2014-12-16 | Disposition: A | Discharge status: Home / Self Care | Payer: 59 | Source: Ambulatory Visit | Attending: Internal Medicine | Admitting: Internal Medicine

## 2014-12-16 DIAGNOSIS — Z0000 Encounter for general adult medical examination without abnormal findings: Secondary | ICD-10-CM

## 2014-12-26 ENCOUNTER — Telehealth: Payer: Self-pay | Admitting: Internal Medicine

## 2014-12-26 NOTE — Telephone Encounter (Signed)
Pt. Would like to see if a Bilateral Diagnostic mammogram and ultrasound can  be sent to [facility].Marland KitchenMarland KitchenMarland KitchenMarland Kitchenplease f/u

## 2015-01-13 ENCOUNTER — Telehealth: Payer: Self-pay | Admitting: *Deleted

## 2015-01-13 DIAGNOSIS — N644 Mastodynia: Secondary | ICD-10-CM

## 2015-01-13 NOTE — Telephone Encounter (Signed)
Pt calling to follow up on previous call regarding mammogram referral.

## 2015-01-13 NOTE — Telephone Encounter (Signed)
Patient verified DOB Patient states she was told to schedule a mammogram with [facility]. Upon arriving to appointment pateint shared she was having concerns with breast. Worker stated she needed a diagnostic instead of screening. Patient called [facility] to have diagnostic completed and was told GI needed doctors referral. Medical assistant informed patient of routing message to provider in order to receive referral approval or any action to take otherwise. Patient also asked about Gastro referral which was placed on the same visit. Medical assistant reviewed notes which stated Gastro office was unable to reach patient on 3/30. Medical assistant gave patient Loistine Chance contact number and OV reference to call and schedule appointment. Patient made aware of receiving a phone call next week in response to Mammogram referral.

## 2015-01-23 NOTE — Telephone Encounter (Signed)
Patient called stating she needs a referral to take a mammogram. Patient stated she talked to the [facility] about it. Please f/u with pt.

## 2015-01-24 NOTE — Telephone Encounter (Signed)
Patient needs Diagnostic [facility] referral. Do you approve this referral? If so, I will send the order in.

## 2015-01-25 NOTE — Telephone Encounter (Signed)
Patient called to check on the status of her mammogram referral. Please f/u

## 2015-01-31 ENCOUNTER — Other Ambulatory Visit: Payer: Self-pay | Admitting: Internal Medicine

## 2015-01-31 DIAGNOSIS — Z1231 Encounter for screening mammogram for malignant neoplasm of breast: Secondary | ICD-10-CM

## 2015-02-01 ENCOUNTER — Other Ambulatory Visit: Payer: Self-pay | Admitting: Internal Medicine

## 2015-02-01 NOTE — Telephone Encounter (Signed)
Medical Assistant spoke with patient regarding soreness in left breast. Per Dr. Jarold Song patient may be scheduled for a Diagnostic Mammogram. Medical Assistant contacted [facility] and scheduled patients appointment. Patient has been made aware of appointment being scheduled for   Medical Assistant contacted patient to specify the site of soreness in left breast. Medical Assistant had to leave a message stating to return a phone call to [place] at [facility]

## 2015-02-01 NOTE — Telephone Encounter (Signed)
Pt. Returned call. Please f/u with pt. °

## 2015-02-01 NOTE — Telephone Encounter (Signed)
Patient verified DOB Patient stated soreness in her left breast is located at position 12 o'clock near her nipple. Patient made aware of appointment being scheduled for Tuesday October 25,2016 at 10:10am. Patient needs to bring images from 2011 [facility].  Medical Assistant provided patient with Facility Number to contact for images, as well as, contact number for [facility] and location. Patient is aware and expressed her understanding. Patient had no further questions.

## 2015-02-07 ENCOUNTER — Ambulatory Visit
Admission: RE | Admit: 2015-02-07 | Discharge: 2015-02-07 | Disposition: A | Discharge status: Home / Self Care | Payer: 59 | Source: Ambulatory Visit | Attending: Family Medicine | Admitting: Family Medicine

## 2015-02-07 DIAGNOSIS — N644 Mastodynia: Secondary | ICD-10-CM

## 2015-02-09 ENCOUNTER — Telehealth: Payer: Self-pay

## 2015-02-09 NOTE — Telephone Encounter (Signed)
CMA called pt and pt verified name and DOB. Pt was given lab results and verbalized that she understood with no further questions.

## 2015-02-09 NOTE — Telephone Encounter (Signed)
-----   Message from Arnoldo Morale, MD sent at 02/08/2015  3:36 PM EDT ----- Please inform the patient that mammogram is normal. Thank you.

## 2015-03-24 NOTE — Telephone Encounter (Signed)
Patient saw Dr. Jarold Song on 7/28 and according to notes this was addressed at this visit and patient was supposed to get her mammogram.  No calls from The [facility] asking for orders.Marland KitchenMarland KitchenMarland Kitchen

## 2015-04-18 ENCOUNTER — Other Ambulatory Visit: Payer: Self-pay | Admitting: Internal Medicine

## 2015-05-01 MED FILL — ?HYDROCHLOROTHIAZIDE 12.5MG: 12.5 | 30 days supply | Qty: 30 | Fill #0

## 2015-05-16 ENCOUNTER — Encounter: Payer: Self-pay | Admitting: Podiatry

## 2015-05-16 ENCOUNTER — Ambulatory Visit (INDEPENDENT_AMBULATORY_CARE_PROVIDER_SITE_OTHER): Payer: 59 | Admitting: Podiatry

## 2015-05-16 ENCOUNTER — Ambulatory Visit (INDEPENDENT_AMBULATORY_CARE_PROVIDER_SITE_OTHER): Payer: 59

## 2015-05-16 VITALS — BP 115/67 | HR 61 | Resp 16 | Ht 70.0 in | Wt 220.0 lb

## 2015-05-16 DIAGNOSIS — Q828 Other specified congenital malformations of skin: Secondary | ICD-10-CM | POA: Diagnosis not present

## 2015-05-16 DIAGNOSIS — M2042 Other hammer toe(s) (acquired), left foot: Secondary | ICD-10-CM

## 2015-05-16 DIAGNOSIS — M779 Enthesopathy, unspecified: Secondary | ICD-10-CM

## 2015-05-16 DIAGNOSIS — M778 Other enthesopathies, not elsewhere classified: Secondary | ICD-10-CM

## 2015-05-16 DIAGNOSIS — M7752 Other enthesopathy of left foot: Secondary | ICD-10-CM | POA: Diagnosis not present

## 2015-05-16 NOTE — Progress Notes (Signed)
   Subjective:    Patient ID: Stacy Bryan, female    DOB: Oct 11, 1955, 60 y.o.   MRN: [id number]  HPI : she presents today with chief complaint of pain to the lateral aspect of her third digit PIPJ left foot. States that had a lot of work done on these feet and hammertoes but this one still seems to be bothering me.    Review of Systems  Constitutional: Positive for diaphoresis and fatigue.  Musculoskeletal: Positive for myalgias.  Neurological: Positive for dizziness.  All other systems reviewed and are negative.      Objective:   Physical Exam : vital signs stable alert and oriented 3 no apparent distress. Pulses are intact. Neurologic sensorium is intact. Deep tendon reflexes are intact. Muscle strength is 5 over 5 dorsiflexors plantar flexors inverters and everters all edges of musculature is intact. Orthopedic evaluation of his resolved joints distal to the ankle for range of motion without crepitation. She has adductovarus rotated toe deformity she also has hammertoe deformities hallux valgus deformities also noted all of this combined in a tight area resulting in reactive hyperkeratosis and pain on palpation of PIPJ third digit left foot. The hyperkeratosis is to the lateral aspect of the PIPJ and is punctate.        Assessment & Plan:   assessment: Capsulitis porokeratosis third digit left foot.  Plan: I injected the area today with dexamethasone and local anesthetic to alleviate capsulitis and the bursitis beneath the reactive hyperkeratotic lesion. Also debrided the reactive hyperkeratotic lesion for her and I will follow-up with her on an as-needed basis .

## 2015-06-05 ENCOUNTER — Emergency Department (HOSPITAL_COMMUNITY): Payer: 59

## 2015-06-05 ENCOUNTER — Encounter (HOSPITAL_COMMUNITY): Payer: Self-pay | Admitting: Nurse Practitioner

## 2015-06-05 ENCOUNTER — Emergency Department (HOSPITAL_COMMUNITY)
Admission: EM | Admit: 2015-06-05 | Discharge: 2015-06-05 | Disposition: A | Discharge status: Home / Self Care | Payer: 59 | Attending: Emergency Medicine | Admitting: Emergency Medicine

## 2015-06-05 DIAGNOSIS — Z7982 Long term (current) use of aspirin: Secondary | ICD-10-CM | POA: Diagnosis not present

## 2015-06-05 DIAGNOSIS — R51 Headache: Secondary | ICD-10-CM | POA: Insufficient documentation

## 2015-06-05 DIAGNOSIS — R42 Dizziness and giddiness: Secondary | ICD-10-CM | POA: Diagnosis present

## 2015-06-05 DIAGNOSIS — Z79899 Other long term (current) drug therapy: Secondary | ICD-10-CM | POA: Diagnosis not present

## 2015-06-05 DIAGNOSIS — E785 Hyperlipidemia, unspecified: Secondary | ICD-10-CM | POA: Diagnosis not present

## 2015-06-05 DIAGNOSIS — I10 Essential (primary) hypertension: Secondary | ICD-10-CM | POA: Diagnosis not present

## 2015-06-05 DIAGNOSIS — H6592 Unspecified nonsuppurative otitis media, left ear: Secondary | ICD-10-CM | POA: Insufficient documentation

## 2015-06-05 DIAGNOSIS — R1010 Upper abdominal pain, unspecified: Secondary | ICD-10-CM | POA: Diagnosis not present

## 2015-06-05 DIAGNOSIS — Z87891 Personal history of nicotine dependence: Secondary | ICD-10-CM | POA: Insufficient documentation

## 2015-06-05 DIAGNOSIS — R197 Diarrhea, unspecified: Secondary | ICD-10-CM | POA: Insufficient documentation

## 2015-06-05 DIAGNOSIS — R112 Nausea with vomiting, unspecified: Secondary | ICD-10-CM | POA: Diagnosis not present

## 2015-06-05 DIAGNOSIS — H6692 Otitis media, unspecified, left ear: Secondary | ICD-10-CM

## 2015-06-05 LAB — URINALYSIS, ROUTINE W REFLEX MICROSCOPIC
BILIRUBIN URINE: NEGATIVE
GLUCOSE, UA: NEGATIVE mg/dL
Hgb urine dipstick: NEGATIVE
KETONES UR: NEGATIVE mg/dL
LEUKOCYTES UA: NEGATIVE
NITRITE: NEGATIVE
PH: 8 (ref 5.0–8.0)
PROTEIN: NEGATIVE mg/dL
Specific Gravity, Urine: 1.01 (ref 1.005–1.030)

## 2015-06-05 LAB — COMPREHENSIVE METABOLIC PANEL
ALK PHOS: 88 U/L (ref 38–126)
ALT: 23 U/L (ref 14–54)
ANION GAP: 7 (ref 5–15)
AST: 21 U/L (ref 15–41)
Albumin: 3.9 g/dL (ref 3.5–5.0)
BILIRUBIN TOTAL: 0.7 mg/dL (ref 0.3–1.2)
BUN: 17 mg/dL (ref 6–20)
CALCIUM: 9.4 mg/dL (ref 8.9–10.3)
CO2: 26 mmol/L (ref 22–32)
Chloride: 108 mmol/L (ref 101–111)
Creatinine, Ser: 0.69 mg/dL (ref 0.44–1.00)
GFR calc Af Amer: >60 mL/min (ref >60)
GLUCOSE: 133 mg/dL — AB (ref 65–99)
POTASSIUM: 4.1 mmol/L (ref 3.5–5.1)
Sodium: 141 mmol/L (ref 135–145)
TOTAL PROTEIN: 7.4 g/dL (ref 6.5–8.1)

## 2015-06-05 LAB — LIPASE, BLOOD: LIPASE: 29 U/L (ref 11–51)

## 2015-06-05 LAB — CBC WITH DIFFERENTIAL/PLATELET
Basophils Absolute: 0 10*3/uL (ref 0.0–0.1)
Basophils Relative: 0 %
EOS PCT: 1 %
Eosinophils Absolute: 0 10*3/uL (ref 0.0–0.7)
HEMATOCRIT: 41.6 % (ref 36.0–46.0)
Hemoglobin: 13.5 g/dL (ref 12.0–15.0)
LYMPHS ABS: 1.2 10*3/uL (ref 0.7–4.0)
LYMPHS PCT: 27 %
MCH: 27.2 pg (ref 26.0–34.0)
MCHC: 32.5 g/dL (ref 30.0–36.0)
MCV: 83.7 fL (ref 78.0–100.0)
Monocytes Absolute: 0.1 10*3/uL (ref 0.1–1.0)
Monocytes Relative: 3 %
NEUTROS ABS: 3 10*3/uL (ref 1.7–7.7)
Neutrophils Relative %: 69 %
PLATELETS: 270 10*3/uL (ref 150–400)
RBC: 4.97 MIL/uL (ref 3.87–5.11)
RDW: 13.5 % (ref 11.5–15.5)
WBC: 4.3 10*3/uL (ref 4.0–10.5)

## 2015-06-05 MED ORDER — SODIUM CHLORIDE 0.9 % IV BOLUS (SEPSIS)
1000.0000 mL | Freq: Once | INTRAVENOUS | Status: AC
  Administered 2015-06-05: 1000 mL via INTRAVENOUS

## 2015-06-05 MED ORDER — ONDANSETRON HCL 4 MG/2ML IJ SOLN
4.0000 mg | Freq: Once | INTRAMUSCULAR | Status: AC
  Administered 2015-06-05: 4 mg via INTRAVENOUS
  Filled 2015-06-05: qty 2

## 2015-06-05 MED ORDER — AMOXICILLIN 500 MG PO CAPS
500.0000 mg | ORAL_CAPSULE | Freq: Once | ORAL | Status: AC
Start: 2015-06-05 — End: 2015-06-05
  Administered 2015-06-05: 500 mg via ORAL
  Filled 2015-06-05: qty 1

## 2015-06-05 MED ORDER — AMOXICILLIN 500 MG PO CAPS: 500.0000 mg | ORAL_CAPSULE | Freq: Three times a day (TID) | ORAL | Status: DC

## 2015-06-05 MED ORDER — MECLIZINE HCL 25 MG PO TABS: 25.0000 mg | ORAL_TABLET | Freq: Four times a day (QID) | ORAL | Status: DC | PRN

## 2015-06-05 MED ORDER — MECLIZINE HCL 25 MG PO TABS
50.0000 mg | ORAL_TABLET | Freq: Once | ORAL | Status: AC
  Administered 2015-06-05: 50 mg via ORAL
  Filled 2015-06-05: qty 2

## 2015-06-05 MED ORDER — DIAZEPAM 5 MG/ML IJ SOLN
5.0000 mg | Freq: Once | INTRAMUSCULAR | Status: AC
  Administered 2015-06-05: 5 mg via INTRAVENOUS
  Filled 2015-06-05: qty 2

## 2015-06-05 NOTE — Discharge Instructions (Signed)
Benign Positional Vertigo °Vertigo is the feeling that you or your surroundings are moving when they are not. Benign positional vertigo is the most common form of vertigo. The cause of this condition is not serious (is benign). This condition is triggered by certain movements and positions (is positional). This condition can be dangerous if it occurs while you are doing something that could endanger you or others, such as driving.  °CAUSES °In many cases, the cause of this condition is not known. It may be caused by a disturbance in an area of the inner ear that helps your brain to sense movement and balance. This disturbance can be caused by a viral infection (labyrinthitis), head injury, or repetitive motion. °RISK FACTORS °This condition is more likely to develop in: °· Women. °· People who are 50 years of age or older. °SYMPTOMS °Symptoms of this condition usually happen when you move your head or your eyes in different directions. Symptoms may start suddenly, and they usually last for less than a minute. Symptoms may include: °· Loss of balance and falling. °· Feeling like you are spinning or moving. °· Feeling like your surroundings are spinning or moving. °· Nausea and vomiting. °· Blurred vision. °· Dizziness. °· Involuntary eye movement (nystagmus). °Symptoms can be mild and cause only slight annoyance, or they can be severe and interfere with daily life. Episodes of benign positional vertigo may return (recur) over time, and they may be triggered by certain movements. Symptoms may improve over time. °DIAGNOSIS °This condition is usually diagnosed by medical history and a physical exam of the head, neck, and ears. You may be referred to a health care provider who specializes in ear, nose, and throat (ENT) problems (otolaryngologist) or a provider who specializes in disorders of the nervous system (neurologist). You may have additional testing, including: °· MRI. °· A CT scan. °· Eye movement tests. Your  health care provider may ask you to change positions quickly while he or she watches you for symptoms of benign positional vertigo, such as nystagmus. Eye movement may be tested with an electronystagmogram (ENG), caloric stimulation, the Dix-Hallpike test, or the roll test. °· An electroencephalogram (EEG). This records electrical activity in your brain. °· Hearing tests. °TREATMENT °Usually, your health care provider will treat this by moving your head in specific positions to adjust your inner ear back to normal. Surgery may be needed in severe cases, but this is rare. In some cases, benign positional vertigo may resolve on its own in 2-4 weeks. °HOME CARE INSTRUCTIONS °Safety °· Move slowly. Avoid sudden body or head movements. °· Avoid driving. °· Avoid operating heavy machinery. °· Avoid doing any tasks that would be dangerous to you or others if a vertigo episode would occur. °· If you have trouble walking or keeping your balance, try using a cane for stability. If you feel dizzy or unstable, sit down right away. °· Return to your normal activities as told by your health care provider. Ask your health care provider what activities are safe for you. °General Instructions °· Take over-the-counter and prescription medicines only as told by your health care provider. °· Avoid certain positions or movements as told by your health care provider. °· Drink enough fluid to keep your urine clear or pale yellow. °· Keep all follow-up visits as told by your health care provider. This is important. °SEEK MEDICAL CARE IF: °· You have a fever. °· Your condition gets worse or you develop new symptoms. °· Your family or friends   notice any behavioral changes.  Your nausea or vomiting gets worse.  You have numbness or a "pins and needles" sensation. SEEK IMMEDIATE MEDICAL CARE IF:  You have difficulty speaking or moving.  You are always dizzy.  You faint.  You develop severe headaches.  You have weakness in your  legs or arms.  You have changes in your hearing or vision.  You develop a stiff neck.  You develop sensitivity to light.   This information is not intended to replace advice given to you by your health care provider. Make sure you discuss any questions you have with your health care provider.   Document Released: 01/07/2006 Document Revised: 12/21/2014 Document Reviewed: 07/25/2014 Elsevier Interactive Patient Education 2016 [place].  Otitis Media, Adult Otitis media is redness, soreness, and puffiness (swelling) in the space just behind your eardrum (middle ear). It may be caused by allergies or infection. It often happens along with a cold. HOME CARE  Take your medicine as told. Finish it even if you start to feel better.  Only take over-the-counter or prescription medicines for pain, discomfort, or fever as told by your doctor.  Follow up with your doctor as told. GET HELP IF:  You have otitis media only in one ear, or bleeding from your nose, or both.  You notice a lump on your neck.  You are not getting better in 3-5 days.  You feel worse instead of better. GET HELP RIGHT AWAY IF:   You have pain that is not helped with medicine.  You have puffiness, redness, or pain around your ear.  You get a stiff neck.  You cannot move part of your face (paralysis).  You notice that the bone behind your ear hurts when you touch it. MAKE SURE YOU:   Understand these instructions.  Will watch your condition.  Will get help right away if you are not doing well or get worse.   This information is not intended to replace advice given to you by your health care provider. Make sure you discuss any questions you have with your health care provider.   Document Released: 09/18/2007 Document Revised: 04/22/2014 Document Reviewed: 10/27/2012 Elsevier Interactive Patient Education [employer].

## 2015-06-05 NOTE — ED Provider Notes (Signed)
CSN: [id number]     Arrival date & time 06/05/15  1157 History   First MD Initiated Contact with Patient 06/05/15 1229     Chief Complaint  Patient presents with  . Emesis  . Dizziness  . Abdominal Distention      (Consider location/radiation/quality/duration/timing/severity/associated sxs/prior Treatment) HPI Comments: Turned over this AM around 3, room spinning Made way to bathroom and threw up Open eyes, everything spinning Nausea severe   Patient is a 60 y.o. female presenting with vomiting and dizziness.  Emesis Associated symptoms: abdominal pain (mild "soreness" upper abdomen), diarrhea (r temple, been there since this AM, progressively worse) and headaches   Associated symptoms: no sore throat   Dizziness Quality:  Room spinning and vertigo Severity:  Severe Onset quality:  Sudden Duration:  10 hours Timing:  Constant Progression:  Waxing and waning Relieved by:  Closing eyes and being still Worsened by:  Eye movement and turning head Associated symptoms: diarrhea (r temple, been there since this AM, progressively worse), headaches, nausea and vomiting   Associated symptoms: no chest pain, no shortness of breath, no syncope, no tinnitus and no weakness   Risk factors: hx of vertigo (never this severe)   Risk factors: no new medications     Past Medical History  Diagnosis Date  . Abnormal Pap smear   . Hypertension   . Thyroid disease   . Allergy   . Hyperlipidemia    Past Surgical History  Procedure Laterality Date  . Foot surgery      both feet  20 yrs ago  . Abdominal hysterectomy  1997    total  . Ovary surgery  1980's    removal of R ovary (cyst)   Family History  Problem Relation Age of Onset  . Hypertension Mother   . Heart disease Father   . Asthma Father   . Asthma Sister   . Diabetes Sister   . Heart disease Sister   . Gout Sister   . Ulcers Sister   . Cancer Maternal Grandmother    Social History  Substance Use Topics  . Smoking  status: Former Smoker    Quit date: 06/02/2006  . Smokeless tobacco: None  . Alcohol Use: No     Comment: occassionally   OB History    No data available     Review of Systems  Constitutional: Negative for fever.  HENT: Positive for ear pain (left). Negative for congestion, sore throat and tinnitus.   Eyes: Negative for visual disturbance.  Respiratory: Negative for cough and shortness of breath.   Cardiovascular: Negative for chest pain and syncope.  Gastrointestinal: Positive for nausea, vomiting, abdominal pain (mild "soreness" upper abdomen) and diarrhea (r temple, been there since this AM, progressively worse).  Genitourinary: Negative for difficulty urinating.  Musculoskeletal: Negative for back pain and neck pain.  Skin: Negative for rash.  Neurological: Positive for dizziness and headaches. Negative for syncope, facial asymmetry, speech difficulty, weakness, light-headedness and numbness.      Allergies  Review of patient's allergies indicates no known allergies.  Home Medications   Prior to Admission medications   Medication Sig Start Date End Date Taking? Authorizing Provider  acetaminophen (TYLENOL) 500 MG tablet Take 1,000 mg by mouth every 6 (six) hours as needed for mild pain.   Yes Historical Provider, MD  aspirin EC 81 MG tablet Take 81 mg by mouth daily.   Yes Historical Provider, MD  Biotin w/ Vitamins C & E (HAIR SKIN &  NAILS GUMMIES PO) Take 2 capsules by mouth daily.   Yes Historical Provider, MD  calcium carbonate (TITRALAC) 420 MG CHEW Chew 420 mg by mouth 2 (two) times daily.    Yes Historical Provider, MD  Cholecalciferol (VITAMIN D-3 PO) Take 600 Units by mouth 2 (two) times daily.   Yes Historical Provider, MD  cyclobenzaprine (FLEXERIL) 5 MG tablet Take 1 tablet (5 mg total) by mouth 3 (three) times daily. Patient taking differently: Take 5 mg by mouth daily as needed for muscle spasms.  07/04/14  Yes Tresa Garter, MD  fish oil-omega-3 fatty  acids 1000 MG capsule Take 2 g by mouth daily.   Yes Historical Provider, MD  hydrochlorothiazide (MICROZIDE) 12.5 MG capsule Take 1 capsule (12.5 mg total) by mouth daily. Need office visit for more refills 04/18/15  Yes Olugbemiga E Doreene Burke, MD  magnesium oxide (MAG-OX) 400 MG tablet Take 400 mg by mouth 2 (two) times daily.   Yes Historical Provider, MD  Zinc Sulfate (ZINC 15 PO) Take 15 mg by mouth 2 (two) times daily.   Yes Historical Provider, MD   BP 118/75 mmHg  Pulse 69  Temp(Src) 97.6 F (36.4 C) (Oral)  Resp 14  SpO2 97% Physical Exam  Constitutional: She is oriented to person, place, and time. She appears well-developed and well-nourished. No distress.  HENT:  Head: Normocephalic and atraumatic.  Right Ear: Tympanic membrane is not injected.  Left Ear: Tympanic membrane is bulging.  Mouth/Throat: No tonsillar abscesses.  No skew Head impulse testing with inability to focus when head turning to right  Eyes: Conjunctivae and EOM are normal. Pupils are equal, round, and reactive to light.  Neck: Normal range of motion.  Cardiovascular: Normal rate, regular rhythm, normal heart sounds and intact distal pulses.  Exam reveals no gallop and no friction rub.   No murmur heard. Pulmonary/Chest: Effort normal and breath sounds normal. No respiratory distress. She has no wheezes. She has no rales.  Abdominal: Soft. She exhibits no distension. There is no tenderness. There is no guarding and no CVA tenderness.  Musculoskeletal: She exhibits no edema or tenderness.  Neurological: She is alert and oriented to person, place, and time. She has normal strength. No cranial nerve deficit or sensory deficit. Coordination normal. Abnormal gait: deferred given severe vertigo. GCS eye subscore is 4. GCS verbal subscore is 5. GCS motor subscore is 6.  Skin: Skin is warm and dry. No rash noted. She is not diaphoretic. No erythema.  Nursing note and vitals reviewed.   ED Course  Procedures (including  critical care time) Labs Review Labs Reviewed  COMPREHENSIVE METABOLIC PANEL - Abnormal; Notable for the following:    Glucose, Bld 133 (*)    All other components within normal limits  URINE CULTURE  CBC WITH DIFFERENTIAL/PLATELET  LIPASE, BLOOD  URINALYSIS, ROUTINE W REFLEX MICROSCOPIC (NOT AT [facility])    Imaging Review Ct Head Wo Contrast  06/05/2015  CLINICAL DATA:  Vertigo; dizziness; n/v; rt temporal ha; since 0300; hx htn; vertigo EXAM: CT HEAD WITHOUT CONTRAST TECHNIQUE: Contiguous axial images were obtained from the base of the skull through the vertex without intravenous contrast. COMPARISON:  None. FINDINGS: Brain: Mild parenchymal atrophy. Patchy areas of hypoattenuation in deep and periventricular white matter bilaterally. Negative for acute intracranial hemorrhage, mass lesion, acute infarction, midline shift, or mass-effect. Acute infarct may be inapparent on noncontrast CT. Ventricles and sulci symmetric. Vascular: No hyperdense vessel or unexpected calcification. Skull: Negative for fracture or focal lesion.  Sinuses/Orbits: No acute findings. Other: None. IMPRESSION: 1.  Negative for bleed or other acute intracranial process. 2. Atrophy and nonspecific white matter changes. Electronically Signed   By: Lucrezia Europe M.D.   On: 06/05/2015 14:28   I have personally reviewed and evaluated these images and lab results as part of my medical decision-making.   EKG Interpretation   Date/Time:  Monday June 05 2015 14:07:56 EST Ventricular Rate:  63 PR Interval:  188 QRS Duration: 72 QT Interval:  424 QTC Calculation: 434 R Axis:   43 Text Interpretation:  Sinus rhythm Probable left atrial enlargement Low  voltage, extremity and precordial leads Baseline wander in lead(s) III No  previous ECGs available Confirmed by [facility] MD, Shaya Altamura ([id number]) on  06/05/2015 4:15:51 PM      MDM   Final diagnoses:  Vertigo  Acute left otitis media, recurrence not specified, unspecified  otitis media type   60yo female with history of htn, vertigo, hyperlipidemia presents with concern for dizziness and nausea.  Differential diagnosis for dizziness includes central causes such as stroke, intracranial bleed, mass and peripheral causes such as BPPV, meniere's disease, viral.  Vertigo is positional, patient has normal neurologic exam, including normal coordination, and have low suspicion for central cause of vertigo by history and physical exam.  Patient does have signs of otitis media on the left.  Patient has headache and CT head was done which showed no acute abnormalities.  Headache began slowly and doubt SAH.  Patient also reported some abdominal pain in triage, however exam is benign, labs are WNL, and is likely secondary to emesis with vertigo her primary concern.   Patient given NS, zofran, meclizine 50mg  with continuing vertigo worse with movements and standing. She is unable to ambulate normally likely secondary to sensation of vertigo.  Will give 5mg  of valium, and if patient is improved and able to ambulate, will plan on discharge with rx for otitis media and vertigo.  If she is unable to ambulate, would plan on admission for further evaluation and care.  Care transferred to oncoming provider, Dr. Wilson Singer with reevaluation pending.   Gareth Morgan, MD 06/05/15 1622

## 2015-06-05 NOTE — ED Notes (Signed)
Per EMS, Pt, from home, c/o emesis, lower abdominal distention, and dizziness starting this morning.  Denies pain.  Hx of vertigo, HTN, and thyroid disease.

## 2015-06-05 NOTE — ED Provider Notes (Signed)
Assumed care in signout. Patient reports that she still is somewhat symptomatic although significantly improved from presentation. She was able to ambulate to the bathroom without assistance. She feels comfortable going home at this point. Will discharge with when necessary meclizine. Amoxicillin for otitis media. Return precautions were discussed.  Virgel Manifold, MD 06/08/15 [phone]

## 2015-06-05 NOTE — ED Notes (Signed)
Bed: [id number] Expected date:  Expected time:  Means of arrival:  Comments: EMS- 60yo F, dizziness/Hx of vertigo

## 2015-06-06 LAB — URINE CULTURE

## 2015-06-07 ENCOUNTER — Encounter: Payer: Self-pay | Admitting: Internal Medicine

## 2015-06-12 NOTE — Telephone Encounter (Signed)
Patient needs a refill for meclizine (ANTIVERT) 25 MG tablet [id number] .Marland KitchenMarland Kitchenpatient states my chart message was sent to nurse....please follow up with patient.  Patient has appt schedule 3/2  @ 945pm with PCP

## 2015-06-15 ENCOUNTER — Ambulatory Visit: Payer: 59 | Admitting: Internal Medicine

## 2015-08-31 ENCOUNTER — Other Ambulatory Visit: Payer: Self-pay | Admitting: Internal Medicine

## 2015-08-31 DIAGNOSIS — E0590 Thyrotoxicosis, unspecified without thyrotoxic crisis or storm: Secondary | ICD-10-CM

## 2015-09-19 ENCOUNTER — Ambulatory Visit (HOSPITAL_COMMUNITY)
Admission: RE | Admit: 2015-09-19 | Discharge: 2015-09-19 | Disposition: A | Discharge status: Home / Self Care | Payer: 59 | Source: Ambulatory Visit | Attending: Internal Medicine | Admitting: Internal Medicine

## 2015-09-19 DIAGNOSIS — E0520 Thyrotoxicosis with toxic multinodular goiter without thyrotoxic crisis or storm: Secondary | ICD-10-CM | POA: Insufficient documentation

## 2015-09-19 DIAGNOSIS — E0590 Thyrotoxicosis, unspecified without thyrotoxic crisis or storm: Secondary | ICD-10-CM

## 2015-09-19 MED ORDER — SODIUM IODIDE I 131 CAPSULE
13.1000 | Freq: Once | INTRAVENOUS | Status: AC | PRN
  Administered 2015-09-19: 13.1 via ORAL

## 2015-09-20 ENCOUNTER — Encounter (HOSPITAL_COMMUNITY)
Admission: RE | Admit: 2015-09-20 | Discharge: 2015-09-20 | Disposition: A | Discharge status: Home / Self Care | Payer: 59 | Source: Ambulatory Visit | Attending: Internal Medicine | Admitting: Internal Medicine

## 2015-09-20 DIAGNOSIS — E0590 Thyrotoxicosis, unspecified without thyrotoxic crisis or storm: Secondary | ICD-10-CM | POA: Diagnosis present

## 2015-09-20 DIAGNOSIS — E0520 Thyrotoxicosis with toxic multinodular goiter without thyrotoxic crisis or storm: Secondary | ICD-10-CM | POA: Diagnosis not present

## 2015-09-20 MED ORDER — SODIUM PERTECHNETATE TC 99M INJECTION
9.7000 | Freq: Once | INTRAVENOUS | Status: AC
  Administered 2015-09-20: 10 via INTRAVENOUS

## 2015-09-28 ENCOUNTER — Encounter: Payer: 59 | Admitting: Podiatry

## 2015-10-03 ENCOUNTER — Ambulatory Visit (INDEPENDENT_AMBULATORY_CARE_PROVIDER_SITE_OTHER): Payer: 59 | Admitting: Podiatry

## 2015-10-03 ENCOUNTER — Ambulatory Visit: Payer: Self-pay

## 2015-10-03 ENCOUNTER — Ambulatory Visit (INDEPENDENT_AMBULATORY_CARE_PROVIDER_SITE_OTHER): Payer: 59

## 2015-10-03 ENCOUNTER — Encounter: Payer: Self-pay | Admitting: Podiatry

## 2015-10-03 ENCOUNTER — Encounter: Payer: Self-pay | Admitting: Internal Medicine

## 2015-10-03 VITALS — BP 113/67 | HR 76 | Resp 16

## 2015-10-03 DIAGNOSIS — I872 Venous insufficiency (chronic) (peripheral): Secondary | ICD-10-CM | POA: Diagnosis not present

## 2015-10-03 DIAGNOSIS — M7751 Other enthesopathy of right foot: Secondary | ICD-10-CM

## 2015-10-03 DIAGNOSIS — M79672 Pain in left foot: Secondary | ICD-10-CM

## 2015-10-03 DIAGNOSIS — M79671 Pain in right foot: Secondary | ICD-10-CM

## 2015-10-03 DIAGNOSIS — M779 Enthesopathy, unspecified: Secondary | ICD-10-CM

## 2015-10-03 DIAGNOSIS — M778 Other enthesopathies, not elsewhere classified: Secondary | ICD-10-CM

## 2015-10-03 NOTE — Progress Notes (Signed)
She presents today with a chief complaint of painful fourth and fifth digits of the left foot. States that she has radiating pain from the toes moving upward toward the ankle. She denies any trauma to the foot. States she's had consistent swelling to the bilateral ankles.  Objective: Vital signs are stable she is alert and oriented 3 have reviewed her past mental history medications and allergies. Pulses remain palpable. Mild pitting edema is noticed bilateral. She is pain on palpation between the fourth and fifth digits of the left foot. Radius taken today do not demonstrate any type of osseus abnormalities. I'm concerned that this very well may be a neuroma or possibly just associated with the edema.  Assessment: Neuroma fourth interdigital space of the left foot. Edema bilateral.  Plan: I recommended venous Dopplers to evaluate for venous insufficiency. I last injected the left foot today with Kenalog and local anesthetic to the fourth interdigital space left. Follow-up with her in the next 2-3 weeks.

## 2015-10-04 ENCOUNTER — Telehealth: Payer: Self-pay | Admitting: *Deleted

## 2015-10-04 DIAGNOSIS — R609 Edema, unspecified: Secondary | ICD-10-CM

## 2015-10-04 NOTE — Telephone Encounter (Addendum)
-----   Message from [place] sent at 10/03/2015 10:24 AM EDT ----- Regarding: Venous doppler Please order and sched a venous doppler left for venous insuff. Thanks!! Orders faxed to [facility].

## 2015-10-12 ENCOUNTER — Encounter (HOSPITAL_COMMUNITY): Payer: 59

## 2015-10-12 NOTE — Telephone Encounter (Signed)
[facility] called to inform us that Stacy Bryan did not show up for her appointment.

## 2015-10-31 ENCOUNTER — Ambulatory Visit: Payer: 59 | Admitting: Podiatry

## 2016-02-24 NOTE — Progress Notes (Signed)
This encounter was created in error - please disregard.

## 2016-08-27 ENCOUNTER — Ambulatory Visit: Payer: 59 | Admitting: Podiatry

## 2016-11-19 ENCOUNTER — Encounter: Payer: Self-pay | Admitting: Podiatry

## 2016-11-19 ENCOUNTER — Ambulatory Visit: Payer: 59

## 2016-11-19 ENCOUNTER — Ambulatory Visit (INDEPENDENT_AMBULATORY_CARE_PROVIDER_SITE_OTHER): Payer: 59 | Admitting: Podiatry

## 2016-11-19 ENCOUNTER — Other Ambulatory Visit: Payer: Self-pay | Admitting: Internal Medicine

## 2016-11-19 DIAGNOSIS — R609 Edema, unspecified: Secondary | ICD-10-CM

## 2016-11-19 DIAGNOSIS — M779 Enthesopathy, unspecified: Secondary | ICD-10-CM

## 2016-11-19 DIAGNOSIS — Z1231 Encounter for screening mammogram for malignant neoplasm of breast: Secondary | ICD-10-CM

## 2016-11-19 DIAGNOSIS — M7750 Other enthesopathy of unspecified foot: Secondary | ICD-10-CM | POA: Diagnosis not present

## 2016-11-19 DIAGNOSIS — I872 Venous insufficiency (chronic) (peripheral): Secondary | ICD-10-CM | POA: Diagnosis not present

## 2016-11-19 MED ORDER — MELOXICAM 15 MG PO TABS: 15.0000 mg | ORAL_TABLET | Freq: Every day | ORAL | 3 refills | Status: DC

## 2016-11-19 NOTE — Progress Notes (Signed)
She presents today for follow-up of her pain and swelling in her ankles. She never went for her vascular evaluation last year.  Objective: Vital signs are stable she is alert and oriented 3 considerable lymphedema to the bilateral lower extremity pulses are palpable left. She has severe pain on palpation and range of motion of the subtalar joints bilaterally. No open lesions or wounds are noted.  Assessment: Lymphedema with subtalar joint capsulitis bilateral.  Plan: We've reinitiated the order to send her for venous studies and for evaluation by the vascular doctors. I also injected the subtalar joints today and started her on meloxicam 15 mg 1 by mouth daily. Follow-up with her in 1 month.

## 2016-11-22 ENCOUNTER — Encounter (HOSPITAL_COMMUNITY): Payer: 59

## 2016-12-02 ENCOUNTER — Ambulatory Visit
Admission: RE | Admit: 2016-12-02 | Discharge: 2016-12-02 | Disposition: A | Discharge status: Home / Self Care | Payer: 59 | Source: Ambulatory Visit | Attending: Internal Medicine | Admitting: Internal Medicine

## 2016-12-02 DIAGNOSIS — Z1231 Encounter for screening mammogram for malignant neoplasm of breast: Secondary | ICD-10-CM

## 2016-12-10 ENCOUNTER — Ambulatory Visit (HOSPITAL_COMMUNITY)
Admission: RE | Admit: 2016-12-10 | Discharge: 2016-12-10 | Disposition: A | Discharge status: Home / Self Care | Payer: 59 | Source: Ambulatory Visit | Attending: Vascular Surgery | Admitting: Vascular Surgery

## 2016-12-10 DIAGNOSIS — R609 Edema, unspecified: Secondary | ICD-10-CM | POA: Insufficient documentation

## 2016-12-10 DIAGNOSIS — I872 Venous insufficiency (chronic) (peripheral): Secondary | ICD-10-CM | POA: Diagnosis present

## 2016-12-19 ENCOUNTER — Ambulatory Visit (INDEPENDENT_AMBULATORY_CARE_PROVIDER_SITE_OTHER): Payer: 59 | Admitting: Podiatry

## 2016-12-19 ENCOUNTER — Telehealth: Payer: Self-pay | Admitting: *Deleted

## 2016-12-19 DIAGNOSIS — M778 Other enthesopathies, not elsewhere classified: Secondary | ICD-10-CM

## 2016-12-19 DIAGNOSIS — M779 Enthesopathy, unspecified: Principal | ICD-10-CM

## 2016-12-19 DIAGNOSIS — R609 Edema, unspecified: Secondary | ICD-10-CM

## 2016-12-19 DIAGNOSIS — M7751 Other enthesopathy of right foot: Secondary | ICD-10-CM | POA: Diagnosis not present

## 2016-12-19 DIAGNOSIS — I872 Venous insufficiency (chronic) (peripheral): Secondary | ICD-10-CM

## 2016-12-19 NOTE — Telephone Encounter (Addendum)
-----   Message from Rip Harbour, [facility] sent at 12/19/2016 10:54 AM EDT ----- Regarding: Vascular consult She had a doppler done, she needs vascular consult for abnormal labs. Thanks!! Faxed referral, clinicals and demographics to [facility].

## 2016-12-19 NOTE — Progress Notes (Signed)
She presents today for follow-up of her venous studies to her left lower extremity. She states that she still having pain to the anterolateral ankle right foot.  Objective: Vital signs are stable alert and oriented 3. Pulses are palpable. She has pitting edema to the left lower extremity venous report does demonstrate deep and superficial venous obstruction. She also has pain on palpation of the sinus tarsi of the right foot.  Assessment: Sinus tarsitis capsulitis right foot. Venous insufficiency left foot.  Plan: Recommend that she follow B follow-up for consult with vascular. And I injected subtalar joint of the right foot.

## 2017-01-01 DIAGNOSIS — J3489 Other specified disorders of nose and nasal sinuses: Secondary | ICD-10-CM | POA: Insufficient documentation

## 2017-01-10 ENCOUNTER — Ambulatory Visit (INDEPENDENT_AMBULATORY_CARE_PROVIDER_SITE_OTHER): Payer: 59 | Admitting: Vascular Surgery

## 2017-01-10 ENCOUNTER — Encounter: Payer: Self-pay | Admitting: Vascular Surgery

## 2017-01-10 VITALS — BP 156/99 | HR 64 | Temp 98.7°F | Resp 18 | Ht 70.0 in | Wt 228.1 lb

## 2017-01-10 DIAGNOSIS — I872 Venous insufficiency (chronic) (peripheral): Secondary | ICD-10-CM

## 2017-01-10 DIAGNOSIS — I83893 Varicose veins of bilateral lower extremities with other complications: Secondary | ICD-10-CM | POA: Diagnosis not present

## 2017-01-10 NOTE — Progress Notes (Signed)
Requested by:  Sandi Mariscal, MD [place], [place] [id number]  Reason for consultation: bilateral leg swelling    History of Present Illness   Stacy Bryan is a 61 y.o. (05-Sep-1955) female who presents with chief complaint: bilateral leg swelling.  Patient notes, onset of swelling several months ago, associated with obvious trigger.  The patient's symptoms include: swelling calves, ache in calves, and some paresthesias and feeling of instability in joint.  The patient has had no history of DVT, known history of pregnancy, known history of varicose vein, no history of venous stasis ulcers, no history of  Lymphedema and no history of skin changes in lower legs.  There is limited family history of venous disorders.  The patient has used OTC compression stockings in the past.  Past Medical History:  Diagnosis Date  . Abnormal Pap smear   . Allergy   . Hyperlipidemia   . Hypertension   . Thyroid disease     Past Surgical History:  Procedure Laterality Date  . ABDOMINAL HYSTERECTOMY  1997   total  . FOOT SURGERY     both feet  20 yrs ago  . OVARY SURGERY  1980's   removal of R ovary (cyst)    Social History   Social History  . Marital status: Single    Spouse name: N/A  . Number of children: N/A  . Years of education: N/A   Occupational History  . Not on file.   Social History Main Topics  . Smoking status: Former Smoker    Quit date: 06/02/2006  . Smokeless tobacco: Never Used  . Alcohol use No     Comment: occassionally  . Drug use: No  . Sexual activity: Not on file   Other Topics Concern  . Not on file   Social History Narrative  . No narrative on file    Family History  Problem Relation Age of Onset  . Hypertension Mother   . Heart disease Father   . Asthma Father   . Hyperlipidemia Father   . Asthma Sister   . Breast cancer Sister   . Diabetes Sister   . Heart disease Sister   . Gout Sister   . Ulcers Sister   . Cancer Maternal  Grandmother     Current Outpatient Prescriptions  Medication Sig Dispense Refill  . acetaminophen (TYLENOL) 500 MG tablet Take 1,000 mg by mouth every 6 (six) hours as needed for mild pain.    Marland Kitchen aspirin EC 81 MG tablet Take 81 mg by mouth daily.    . Biotin w/ Vitamins C & E (HAIR SKIN & NAILS GUMMIES PO) Take 2 capsules by mouth daily.    . calcium carbonate (TITRALAC) 420 MG CHEW Chew 420 mg by mouth 2 (two) times daily.     . Cholecalciferol (VITAMIN D-3 PO) Take 600 Units by mouth 2 (two) times daily.    . cyclobenzaprine (FLEXERIL) 5 MG tablet Take 1 tablet (5 mg total) by mouth 3 (three) times daily. (Patient taking differently: Take 5 mg by mouth daily as needed for muscle spasms. ) 30 tablet 3  . fish oil-omega-3 fatty acids 1000 MG capsule Take 2 g by mouth daily.    . hydrochlorothiazide (MICROZIDE) 12.5 MG capsule Take 1 capsule (12.5 mg total) by mouth daily. Need office visit for more refills 30 capsule 1  . magnesium oxide (MAG-OX) 400 MG tablet Take 400 mg by mouth 2 (two) times daily.    Marland Kitchen  meclizine (ANTIVERT) 25 MG tablet Take 1 tablet (25 mg total) by mouth every 6 (six) hours as needed for dizziness. 15 tablet 0  . meloxicam (MOBIC) 15 MG tablet Take 1 tablet (15 mg total) by mouth daily. 30 tablet 3  . Zinc Sulfate (ZINC 15 PO) Take 15 mg by mouth 2 (two) times daily.     No current facility-administered medications for this visit.     No Known Allergies  REVIEW OF SYSTEMS (negative unless checked):   Cardiac:  []  Chest pain or chest pressure? [x]  Shortness of breath upon activity? []  Shortness of breath when lying flat? []  Irregular heart rhythm?  Vascular:  [x]  Pain in calf, thigh, or hip brought on by walking? []  Pain in feet at night that wakes you up from your sleep? []  Blood clot in your veins? [x]  Leg swelling?  Pulmonary:  []  Oxygen at home? []  Productive cough? []  Wheezing?  Neurologic:  []  Sudden weakness in arms or legs? []  Sudden numbness in  arms or legs? []  Sudden onset of difficult speaking or slurred speech? []  Temporary loss of vision in one eye? [x]  Problems with dizziness?  Gastrointestinal:  []  Blood in stool? []  Vomited blood?  Genitourinary:  []  Burning when urinating? []  Blood in urine?  Psychiatric:  []  Major depression  Hematologic:  []  Bleeding problems? []  Problems with blood clotting?  Dermatologic:  []  Rashes or ulcers?  Constitutional:  []  Fever or chills?  Ear/Nose/Throat:  []  Change in hearing? []  Nose bleeds? []  Sore throat?  Musculoskeletal:  []  Back pain? []  Joint pain? []  Muscle pain?   Physical Examination     Vitals:   01/10/17 0831 01/10/17 0834  BP: (!) 158/98 (!) 156/99  Pulse: 64   Resp: 18   Temp: 98.7 F (37.1 C)   TempSrc: Oral   SpO2: 96%   Weight: 228 lb 1.6 oz (103.5 kg)   Height: 5\' 10"  (1.778 m)    Body mass index is 32.73 kg/m.  General Alert, O x 3, WD, NAD  Head [place]/AT,    Ear/Nose/ Throat Hearing grossly intact, nares without erythema or drainage, oropharynx without Erythema or Exudate, Mallampati score: 3,   Eyes PERRLA, EOMI,    Neck Supple, mid-line trachea,    Pulmonary Sym exp, good B air movt, CTA B  Cardiac RRR, Nl S1, S2, no Murmurs, No rubs, No S3,S4  Vascular Vessel Right Left  Radial Palpable Palpable  Brachial Palpable Palpable  Carotid Palpable, No Bruit Palpable, No Bruit  Aorta Not palpable N/A  Femoral Palpable Palpable  Popliteal Not palpable Not palpable  PT Palpable Palpable  DP Palpable Palpable    Gastro- intestinal soft, non-distended, non-tender to palpation, No guarding or rebound, no HSM, no masses, no CVAT B, No palpable prominent aortic pulse,    Musculo- skeletal M/S 5/5 throughout  , Extremities without ischemic changes  , Non-pitting edema present: 1+ B, Varicosities present: L small-mod, No Lipodermatosclerosis present  Neurologic Cranial nerves 2-12 intact , Pain and light touch intact in extremities ,  Motor exam as listed above  Psychiatric Judgement intact, Mood & affect appropriate for pt's clinical situation  Dermatologic See M/S exam for extremity exam, No rashes otherwise noted  Lymphatic  Palpable lymph nodes: None    Non-invasive Vascular Imaging   LLE Venous Insufficiency Duplex (12/10/16):   LLE:  no DVT and SVT,   + GSV reflux: 2.6-7.0 mm  no SSV reflux,  no deep venous reflux  Outside Studies/Documentation   2 pages of outside documents were reviewed including: outpatient Podiatry chart.   Medical Decision Making   Stacy Bryan is a 61 y.o. female who presents with: LLE chronic venous insufficiency (C2), varicose veins with complications   Based on the patient's history and examination, I recommend: compressive therapy.  I discussed with the patient the use of her 20-30 mm thigh high compression stockings and need for 3 month trial of such.  The patient will follow up in 3 months with my partners in the [facility] for evaluation for: possible L GSV EVLA.  Thank you for allowing [place] to participate in this patient's care.   Adele Barthel, MD, FACS [facility] Office: [phone] Pager: [phone]  01/10/2017, 8:36 AM

## 2017-01-23 ENCOUNTER — Encounter (HOSPITAL_COMMUNITY): Payer: Self-pay | Admitting: Emergency Medicine

## 2017-01-23 ENCOUNTER — Emergency Department (HOSPITAL_COMMUNITY): Payer: 59

## 2017-01-23 ENCOUNTER — Emergency Department (HOSPITAL_COMMUNITY)
Admission: EM | Admit: 2017-01-23 | Discharge: 2017-01-23 | Disposition: A | Discharge status: Home / Self Care | Payer: 59 | Attending: Emergency Medicine | Admitting: Emergency Medicine

## 2017-01-23 DIAGNOSIS — R42 Dizziness and giddiness: Secondary | ICD-10-CM | POA: Insufficient documentation

## 2017-01-23 DIAGNOSIS — Z79899 Other long term (current) drug therapy: Secondary | ICD-10-CM | POA: Insufficient documentation

## 2017-01-23 DIAGNOSIS — Z87891 Personal history of nicotine dependence: Secondary | ICD-10-CM | POA: Insufficient documentation

## 2017-01-23 LAB — CBC WITH DIFFERENTIAL/PLATELET
BASOS ABS: 0 10*3/uL (ref 0.0–0.1)
BASOS PCT: 1 %
Eosinophils Absolute: 0.1 10*3/uL (ref 0.0–0.7)
Eosinophils Relative: 1 %
HEMATOCRIT: 40.2 % (ref 36.0–46.0)
HEMOGLOBIN: 13.5 g/dL (ref 12.0–15.0)
LYMPHS PCT: 28 %
Lymphs Abs: 1.4 10*3/uL (ref 0.7–4.0)
MCH: 27.8 pg (ref 26.0–34.0)
MCHC: 33.6 g/dL (ref 30.0–36.0)
MCV: 82.7 fL (ref 78.0–100.0)
MONO ABS: 0.2 10*3/uL (ref 0.1–1.0)
MONOS PCT: 4 %
NEUTROS ABS: 3.3 10*3/uL (ref 1.7–7.7)
NEUTROS PCT: 66 %
Platelets: 263 10*3/uL (ref 150–400)
RBC: 4.86 MIL/uL (ref 3.87–5.11)
RDW: 13.8 % (ref 11.5–15.5)
WBC: 5 10*3/uL (ref 4.0–10.5)

## 2017-01-23 LAB — BASIC METABOLIC PANEL
ANION GAP: 9 (ref 5–15)
BUN: 14 mg/dL (ref 6–20)
CALCIUM: 9.3 mg/dL (ref 8.9–10.3)
CHLORIDE: 106 mmol/L (ref 101–111)
CO2: 25 mmol/L (ref 22–32)
Creatinine, Ser: 0.8 mg/dL (ref 0.44–1.00)
GFR calc non Af Amer: >60 mL/min (ref >60)
GLUCOSE: 122 mg/dL — AB (ref 65–99)
POTASSIUM: 3.9 mmol/L (ref 3.5–5.1)
Sodium: 140 mmol/L (ref 135–145)

## 2017-01-23 MED ORDER — LORAZEPAM 2 MG/ML IJ SOLN
0.5000 mg | Freq: Once | INTRAMUSCULAR | Status: AC
  Administered 2017-01-23: 0.5 mg via INTRAVENOUS
  Filled 2017-01-23: qty 1

## 2017-01-23 MED ORDER — MECLIZINE HCL 25 MG PO TABS
25.0000 mg | ORAL_TABLET | Freq: Once | ORAL | Status: AC
  Administered 2017-01-23: 25 mg via ORAL
  Filled 2017-01-23: qty 1

## 2017-01-23 MED ORDER — SODIUM CHLORIDE 0.9 % IV BOLUS (SEPSIS)
500.0000 mL | Freq: Once | INTRAVENOUS | Status: AC
  Administered 2017-01-23: 500 mL via INTRAVENOUS

## 2017-01-23 NOTE — ED Notes (Signed)
Pt reports dizziness has subsided

## 2017-01-23 NOTE — Discharge Instructions (Addendum)
Return here as needed.  Follow-up with your primary care doctor °

## 2017-01-23 NOTE — ED Notes (Signed)
Pt ambulated around the dept without difficulty. Reports feeling much better than when she arrived.

## 2017-01-23 NOTE — ED Provider Notes (Signed)
The patient is feeling better following the Ativan.  She was also able to ambulate without difficulty.  Patient be discharged home.  Told to follow-up with her primary doctor   Dalia Heading, PA-C 01/23/17 1025    Orpah Greek, MD 01/26/17 [phone]

## 2017-01-23 NOTE — ED Provider Notes (Signed)
[facility] DEPT Provider Note   CSN: [id number] Arrival date & time: 01/23/17  0137     History   Chief Complaint Chief Complaint  Patient presents with  . Dizziness    HPI Stacy Bryan is a 61 y.o. female.  Patient presents to the emergency department for evaluation of headache and his anus. Patient reports that she does have a history of vertigo. Tonight, however, she had onset of a frontal headache that is unusual for her. The headache resolved but she then developed a vertiginous dizzy sensation that has been persistent. Current dizziness is similar to previous episodes of vertigo.      Past Medical History:  Diagnosis Date  . Abnormal Pap smear   . Allergy   . Hyperlipidemia   . Hypertension   . Thyroid disease     Patient Active Problem List   Diagnosis Date Noted  . Chronic venous insufficiency 01/10/2017  . Varicose veins of both lower extremities with complications 81/82/9937  . Preventative health care 07/04/2014  . Chronic maxillary sinusitis 07/04/2014  . Midline low back pain without sciatica 10/08/2013  . Encounter to establish care 10/08/2013  . Other specified hypothyroidism 10/08/2013  . Breast cancer screening 10/08/2013  . Colon cancer screening 10/08/2013  . Subclinical hypothyroidism 10/02/2011  . HTN (hypertension), benign 06/01/2011  . Dyslipidemia 06/01/2011  . GERD (gastroesophageal reflux disease) 06/01/2011  . History of seasonal allergies 06/01/2011    Past Surgical History:  Procedure Laterality Date  . ABDOMINAL HYSTERECTOMY  1997   total  . FOOT SURGERY     both feet  20 yrs ago  . OVARY SURGERY  1980's   removal of R ovary (cyst)    OB History    No data available       Home Medications    Prior to Admission medications   Medication Sig Start Date End Date Taking? Authorizing Provider  aspirin EC 81 MG tablet Take 81 mg by mouth daily.   Yes [provider]  Cholecalciferol (VITAMIN D-3 PO)  Take 400 Units by mouth daily.    Yes [provider]  fish oil-omega-3 fatty acids 1000 MG capsule Take 2 g by mouth daily.   Yes [provider]  magnesium oxide (MAG-OX) 400 MG tablet Take 400 mg by mouth daily.    Yes [provider]  meclizine (ANTIVERT) 25 MG tablet Take 1 tablet (25 mg total) by mouth every 6 (six) hours as needed for dizziness. 06/05/15  Yes Virgel Manifold, MD  meloxicam (MOBIC) 15 MG tablet Take 1 tablet (15 mg total) by mouth daily. 11/19/16  Yes Hyatt, Max T, DPM  methimazole (TAPAZOLE) 5 MG tablet Take 5 mg by mouth daily.  12/14/16  Yes [provider]  mupirocin ointment (BACTROBAN) 2 % Place 1 application into the nose 2 (two) times daily as needed. Wound   Yes [provider]  omega-3 acid ethyl esters (LOVAZA) 1 g capsule Take 1 g by mouth daily.    Yes [provider]  Zinc Sulfate (ZINC 15 PO) Take 15 mg by mouth daily.    Yes [provider]  Biotin w/ Vitamins C & E (HAIR SKIN & NAILS GUMMIES PO) Take 2 capsules by mouth daily.    [provider]  calcium carbonate (TITRALAC) 420 MG CHEW chewable tablet Chew by mouth.    [provider]  cyclobenzaprine (FLEXERIL) 5 MG tablet Take 1 tablet (5 mg total) by mouth 3 (three) times  daily. Patient not taking: Reported on 01/23/2017 07/04/14   Tresa Garter, MD  hydrochlorothiazide (MICROZIDE) 12.5 MG capsule Take 1 capsule (12.5 mg total) by mouth daily. Need office visit for more refills Patient not taking: Reported on 01/23/2017 04/18/15   Tresa Garter, MD    Family History Family History  Problem Relation Age of Onset  . Hypertension Mother   . Heart disease Father   . Asthma Father   . Hyperlipidemia Father   . Asthma Sister   . Breast cancer Sister   . Diabetes Sister   . Heart disease Sister   . Gout Sister   . Ulcers Sister   . Cancer Maternal Grandmother     Social History Social History  Substance Use  Topics  . Smoking status: Former Smoker    Quit date: 06/02/2006  . Smokeless tobacco: Never Used  . Alcohol use No     Comment: occassionally     Allergies   Patient has no known allergies.   Review of Systems Review of Systems  Neurological: Positive for dizziness and headaches.  All other systems reviewed and are negative.    Physical Exam Updated Vital Signs BP (!) 170/92 (BP Location: Left Arm)   Pulse 63   Temp 98.2 F (36.8 C) (Oral)   Resp 16   SpO2 100%   Physical Exam  Constitutional: She is oriented to person, place, and time. She appears well-developed and well-nourished. No distress.  HENT:  Head: Normocephalic and atraumatic.  Right Ear: Hearing normal.  Left Ear: Hearing normal.  Nose: Nose normal.  Mouth/Throat: Oropharynx is clear and moist and mucous membranes are normal.  Eyes: Pupils are equal, round, and reactive to light. Conjunctivae and EOM are normal.  Neck: Normal range of motion. Neck supple.  Cardiovascular: Regular rhythm, S1 normal and S2 normal.  Exam reveals no gallop and no friction rub.   No murmur heard. Pulmonary/Chest: Effort normal and breath sounds normal. No respiratory distress. She exhibits no tenderness.  Abdominal: Soft. Normal appearance and bowel sounds are normal. There is no hepatosplenomegaly. There is no tenderness. There is no rebound, no guarding, no tenderness at McBurney's point and negative Murphy's sign. No hernia.  Musculoskeletal: Normal range of motion.  Neurological: She is alert and oriented to person, place, and time. She has normal strength. No cranial nerve deficit or sensory deficit. Coordination normal. GCS eye subscore is 4. GCS verbal subscore is 5. GCS motor subscore is 6.  Skin: Skin is warm, dry and intact. No rash noted. No cyanosis.  Psychiatric: She has a normal mood and affect. Her speech is normal and behavior is normal. Thought content normal.  Nursing note and vitals reviewed.    ED  Treatments / Results  Labs (all labs ordered are listed, but only abnormal results are displayed) Labs Reviewed  CBC WITH DIFFERENTIAL/PLATELET  BASIC METABOLIC PANEL    EKG  EKG Interpretation None       Radiology Ct Head Wo Contrast  Result Date: 01/23/2017 CLINICAL DATA:  Acute onset of dizziness, nausea, vomiting and diarrhea. Vertigo. Headache. Initial encounter. EXAM: CT HEAD WITHOUT CONTRAST TECHNIQUE: Contiguous axial images were obtained from the base of the skull through the vertex without intravenous contrast. COMPARISON:  CT of the head performed 06/05/2015 FINDINGS: Brain: No evidence of acute infarction, hemorrhage, hydrocephalus, extra-axial collection or mass lesion/mass effect. Prominence of the sulci suggests mild cortical volume loss. Scattered periventricular and subcortical white matter change likely reflects small vessel  ischemic microangiopathy. Mild cerebellar atrophy is noted. The brainstem and fourth ventricle are within normal limits. The basal ganglia are unremarkable in appearance. The cerebral hemispheres demonstrate grossly normal gray-white differentiation. No mass effect or midline shift is seen. Vascular: No hyperdense vessel or unexpected calcification. Skull: There is no evidence of fracture; visualized osseous structures are unremarkable in appearance. Sinuses/Orbits: The orbits are within normal limits. The paranasal sinuses and mastoid air cells are well-aerated. Other: No significant soft tissue abnormalities are seen. IMPRESSION: 1. No acute intracranial pathology seen on CT. 2. Mild cortical volume loss and scattered small vessel ischemic microangiopathy. Electronically Signed   By: Garald Balding M.D.   On: 01/23/2017 05:55    Procedures Procedures (including critical care time)  Medications Ordered in ED Medications  sodium chloride 0.9 % bolus 500 mL (not administered)  LORazepam (ATIVAN) injection 0.5 mg (not administered)  meclizine  (ANTIVERT) tablet 25 mg (25 mg Oral Given 01/23/17 0302)     Initial Impression / Assessment and Plan / ED Course  I have reviewed the triage vital signs and the nursing notes.  Pertinent labs & imaging results that were available during my care of the patient were reviewed by me and considered in my medical decision making (see chart for details).     Patient presented for evaluation of dizziness. Patient has a history of vertigo, is currently experiencing vertiginous room spinning sensation with movements of her head. This is similar to vertigo she has had in the past. She started, however, with a headache which is unusual for her. She does not have any unusual neurologic findings on examination. Head CT is negative. Patient did not have improvement with meclizine, will administer IV fluids and Ativan, follow progress. Will sign out to [employer], PA-C at time of shift change.  Final Clinical Impressions(s) / ED Diagnoses   Final diagnoses:  Vertigo    New Prescriptions New Prescriptions   No medications on file     Orpah Greek, MD 01/23/17 [phone]

## 2017-01-23 NOTE — ED Triage Notes (Signed)
Pt states Wednesday evening when she got in from work she started having a headache  Pt states she thought it was from where she had not eaten so she ate something and then started feeling dizzy  Pt states she laid down  Pt states then she started having nausea, vomiting, and diarrhea  Pt states then the dizziness got worse  Pt states she has a hx of vertigo and this feels a lot like that

## 2017-02-19 ENCOUNTER — Ambulatory Visit: Payer: 59 | Admitting: Internal Medicine

## 2017-02-26 ENCOUNTER — Ambulatory Visit: Payer: 59 | Attending: Internal Medicine | Admitting: Internal Medicine

## 2017-02-26 VITALS — BP 135/78 | HR 61 | Temp 97.8°F | Resp 18 | Ht 70.0 in | Wt 230.0 lb

## 2017-02-26 DIAGNOSIS — K219 Gastro-esophageal reflux disease without esophagitis: Secondary | ICD-10-CM | POA: Diagnosis not present

## 2017-02-26 DIAGNOSIS — M7989 Other specified soft tissue disorders: Secondary | ICD-10-CM | POA: Diagnosis not present

## 2017-02-26 DIAGNOSIS — Z79899 Other long term (current) drug therapy: Secondary | ICD-10-CM | POA: Insufficient documentation

## 2017-02-26 DIAGNOSIS — I10 Essential (primary) hypertension: Secondary | ICD-10-CM | POA: Diagnosis not present

## 2017-02-26 DIAGNOSIS — E0590 Thyrotoxicosis, unspecified without thyrotoxic crisis or storm: Secondary | ICD-10-CM | POA: Insufficient documentation

## 2017-02-26 DIAGNOSIS — R42 Dizziness and giddiness: Secondary | ICD-10-CM | POA: Insufficient documentation

## 2017-02-26 DIAGNOSIS — I872 Venous insufficiency (chronic) (peripheral): Secondary | ICD-10-CM | POA: Diagnosis not present

## 2017-02-26 DIAGNOSIS — G8929 Other chronic pain: Secondary | ICD-10-CM | POA: Insufficient documentation

## 2017-02-26 DIAGNOSIS — M545 Low back pain, unspecified: Secondary | ICD-10-CM

## 2017-02-26 DIAGNOSIS — E785 Hyperlipidemia, unspecified: Secondary | ICD-10-CM | POA: Insufficient documentation

## 2017-02-26 DIAGNOSIS — Z7982 Long term (current) use of aspirin: Secondary | ICD-10-CM | POA: Insufficient documentation

## 2017-02-26 DIAGNOSIS — Z87891 Personal history of nicotine dependence: Secondary | ICD-10-CM | POA: Diagnosis not present

## 2017-02-26 LAB — POCT GLYCOSYLATED HEMOGLOBIN (HGB A1C): HEMOGLOBIN A1C: 6.2

## 2017-02-26 MED ORDER — HYDROCHLOROTHIAZIDE 12.5 MG PO CAPS: 12.5000 mg | ORAL_CAPSULE | Freq: Every day | ORAL | 3 refills | Status: DC

## 2017-02-26 MED ORDER — DICLOFENAC SODIUM 1 % TD GEL: 2.0000 g | Freq: Four times a day (QID) | TRANSDERMAL | 1 refills | Status: DC

## 2017-02-26 MED ORDER — IBUPROFEN 800 MG PO TABS: 800.0000 mg | ORAL_TABLET | Freq: Three times a day (TID) | ORAL | 0 refills | Status: DC | PRN

## 2017-02-26 MED ORDER — CYCLOBENZAPRINE HCL 5 MG PO TABS: 5.0000 mg | ORAL_TABLET | Freq: Three times a day (TID) | ORAL | 3 refills | Status: DC

## 2017-02-26 MED ORDER — MECLIZINE HCL 25 MG PO TABS: 25.0000 mg | ORAL_TABLET | Freq: Four times a day (QID) | ORAL | 1 refills | Status: DC | PRN

## 2017-02-26 NOTE — Progress Notes (Signed)
Stacy Bryan, is a 61 y.o. female  BZM:[id number]  QAE:[id number]  DOB - 1956-02-01 HPI: Stacy Bryan is a 61 y.o. female with history of subclinical hypothyroidism, now "hyperthyroidism" on methimazole (we have no record of diagnosis and treatment), dyslipidemia, GERD and chronic leg edema who presents here today to re-establish medical care after > 2 years being lost to follow up. She has multiple complaints today. She was diagnosed with hyperthyroidism by a physician who is not on Epic and no medical record available. But patient said she went for iodine uptake test and subsequently placed on methimazole 5 mg daily. She has since stopped going to the doctor. She is also complaining of a tingling and numbness around the back of her head and neck for which she went to the ED and had CT head done which was negative. She has chronic vertigo which patient said still happens occasionally, in fact she had being to the ED recently for an episode that did not go away with her usual meclizine. She got better only after she was given Ativan and IVF. She does not have fever. She has no hx of fall. Her chronic leg swelling is improving with ted os (she has chronic venous insufficiency especially on the right). She is taking multiple OTC vitamins and zinc. She has no symptom of hyper or hypothyroidism, She denies depression, she denies any suicidal ideation or thoughts. Back pain is mostly on the left upper buttock area and tail bone, no swelling, no urinary or fecal incontinence. Patient has No headache, No chest pain, No abdominal pain - No Nausea, No new weakness tingling or numbness, No Cough - SOB.   No Known Allergies Past Medical History:  Diagnosis Date  . Abnormal Pap smear   . Allergy   . Hyperlipidemia   . Hypertension   . Thyroid disease    Current Outpatient Medications on File Prior to Visit  Medication Sig Dispense Refill  . aspirin EC 81 MG tablet Take 81 mg by mouth daily.    . Biotin  w/ Vitamins C & E (HAIR SKIN & NAILS GUMMIES PO) Take 2 capsules by mouth daily.    . calcium carbonate (TITRALAC) 420 MG CHEW chewable tablet Chew by mouth.    . Cholecalciferol (VITAMIN D-3 PO) Take 400 Units by mouth daily.     . fish oil-omega-3 fatty acids 1000 MG capsule Take 2 g by mouth daily.    . magnesium oxide (MAG-OX) 400 MG tablet Take 400 mg by mouth daily.     . methimazole (TAPAZOLE) 5 MG tablet Take 5 mg by mouth daily.     . mupirocin ointment (BACTROBAN) 2 % Place 1 application into the nose 2 (two) times daily as needed. Wound    . omega-3 acid ethyl esters (LOVAZA) 1 g capsule Take 1 g by mouth daily.     . Zinc Sulfate (ZINC 15 PO) Take 15 mg by mouth daily.      No current facility-administered medications on file prior to visit.    Family History  Problem Relation Age of Onset  . Hypertension Mother   . Heart disease Father   . Asthma Father   . Hyperlipidemia Father   . Asthma Sister   . Breast cancer Sister   . Diabetes Sister   . Heart disease Sister   . Gout Sister   . Ulcers Sister   . Cancer Maternal Grandmother    Social History   Socioeconomic History  . Marital  status: Single    Spouse name: Not on file  . Number of children: Not on file  . Years of education: Not on file  . Highest education level: Not on file  Social Needs  . Financial resource strain: Not on file  . Food insecurity - worry: Not on file  . Food insecurity - inability: Not on file  . Transportation needs - medical: Not on file  . Transportation needs - non-medical: Not on file  Occupational History  . Not on file  Tobacco Use  . Smoking status: Former Smoker    Last attempt to quit: 06/02/2006    Years since quitting: 10.7  . Smokeless tobacco: Never Used  Substance and Sexual Activity  . Alcohol use: No    Comment: occassionally  . Drug use: No  . Sexual activity: Not on file  Other Topics Concern  . Not on file  Social History Narrative  . Not on file     Review of Systems: Constitutional: Negative for fever, chills, diaphoresis, activity change, appetite change and fatigue. HENT: Negative for ear pain, nosebleeds, congestion, facial swelling, rhinorrhea, neck pain, neck stiffness and ear discharge.  Eyes: Negative for pain, discharge, redness, itching and visual disturbance. Respiratory: Negative for cough, choking, chest tightness, shortness of breath, wheezing and stridor.  Cardiovascular: Negative for chest pain, palpitations and leg swelling. Gastrointestinal: Negative for abdominal distention. Genitourinary: Negative for dysuria, urgency, frequency, hematuria, flank pain, decreased urine volume, difficulty urinating and dyspareunia.  Musculoskeletal: Positive for back pain, joint swelling, arthralgia and gait problem. Neurological: Negative for dizziness, tremors, seizures, syncope, facial asymmetry, speech difficulty, weakness, light-headedness, numbness and headaches.  Hematological: Negative for adenopathy. Does not bruise/bleed easily. Psychiatric/Behavioral: Negative for hallucinations, behavioral problems, confusion, dysphoric mood, decreased concentration and agitation.   Objective:   Vitals:   02/26/17 0954  BP: 135/78  Pulse: 61  Resp: 18  Temp: 97.8 F (36.6 C)  SpO2: 98%   Physical Exam: Constitutional: Patient appears well-developed and well-nourished. No distress. HENT: Normocephalic, atraumatic, External right and left ear normal. Oropharynx is clear and moist.  Eyes: Conjunctivae and EOM are normal. PERRLA, no scleral icterus. Neck: Normal ROM. Neck supple. No JVD. No tracheal deviation. No thyromegaly. CVS: RRR, S1/S2 +, no murmurs, no gallops, no carotid bruit.  Pulmonary: Effort and breath sounds normal, no stridor, rhonchi, wheezes, rales.  Abdominal: Soft. BS +, no distension, tenderness, rebound or guarding.  Musculoskeletal: Normal range of motion. No edema and no tenderness.  Lymphadenopathy: No  lymphadenopathy noted, cervical, inguinal or axillary Neuro: Alert. Normal reflexes, muscle tone coordination. No cranial nerve deficit. Skin: Skin is warm and dry. No rash noted. Not diaphoretic. No erythema. No pallor. Psychiatric: Normal mood and affect. Behavior, judgment, thought content normal.  Lab Results  Component Value Date   WBC 5.0 01/23/2017   HGB 13.5 01/23/2017   HCT 40.2 01/23/2017   MCV 82.7 01/23/2017   PLT 263 01/23/2017   Lab Results  Component Value Date   CREATININE 0.80 01/23/2017   BUN 14 01/23/2017   NA 140 01/23/2017   K 3.9 01/23/2017   CL 106 01/23/2017   CO2 25 01/23/2017   Lab Results  Component Value Date   HGBA1C 5.7 10/08/2013   Lipid Panel     Component Value Date/Time   CHOL 247 (H) 10/08/2013 1130   TRIG 173 (H) 10/08/2013 1130   HDL 51 10/08/2013 1130   CHOLHDL 4.8 10/08/2013 1130   VLDL 35 10/08/2013 1130  LDLCALC 161 (H) 10/08/2013 1130     Assessment and plan:   1. Hyperthyroidism   - Thyroid Peroxidase Antibody - Thyroid Profile - Continue methimazole for now, will decide ongoing use based on result of thyroid profile. - May need Endocrinologist Evaluation  2. HTN (hypertension), benign  - hydrochlorothiazide (MICROZIDE) 12.5 MG capsule; Take 1 capsule (12.5 mg total) daily by mouth. Need office visit for more refills  Dispense: 30 capsule; Refill: 3 - CMP14+EGFR  3. Chronic midline low back pain without sciatica  - cyclobenzaprine (FLEXERIL) 5 MG tablet; Take 1 tablet (5 mg total) 3 (three) times daily by mouth.  Dispense: 60 tablet; Refill: 3 - diclofenac sodium (VOLTAREN) 1 % GEL; Apply 2 g 4 (four) times daily topically.  Dispense: 1 Tube; Refill: 1 - Urinalysis, Complete - ibuprofen (ADVIL,MOTRIN) 800 MG tablet; Take 1 tablet (800 mg total) every 8 (eight) hours as needed by mouth.  Dispense: 60 tablet; Refill: 0  4. Chronic venous insufficiency  - CBC with Differential/Platelet - No significant leg edema  today - Continue stockings use  5. Dyslipidemia  - Lipid panel   To address this please limit saturated fat to no more than 7% of your calories, limit cholesterol to 200 mg/day, increase fiber and exercise as tolerated. If needed we may add another cholesterol lowering medication to your regimen.   6. Vertigo  - meclizine (ANTIVERT) 25 MG tablet; Take 1 tablet (25 mg total) every 6 (six) hours as needed by mouth for dizziness.  Dispense: 30 tablet; Refill: 1  Return in about 3 months (around 05/29/2017) for Hyperthyroidism, Low Back Pain, Leg Swelling.  The patient was given clear instructions to go to ER or return to medical center if symptoms don't improve, worsen or new problems develop. The patient verbalized understanding. The patient was told to call to get lab results if they haven't heard anything in the next week.   This note has been created with Surveyor, quantity. Any transcriptional errors are unintentional.    Angelica Chessman, MD, [place], Karilyn Cota, [place], [place]   02/26/2017, 10:55 AM

## 2017-02-26 NOTE — Patient Instructions (Signed)
Hyperthyroidism Hyperthyroidism is when the thyroid is too active (overactive). Your thyroid is a large gland that is located in your neck. The thyroid helps to control how your body uses food (metabolism). When your thyroid is overactive, it produces too much of a hormone called thyroxine. What are the causes? Causes of hyperthyroidism may include:  Graves disease. This is when your immune system attacks the thyroid gland. This is the most common cause.  Inflammation of the thyroid gland.  Tumor in the thyroid gland or somewhere else.  Excessive use of thyroid medicines, including: ? Prescription thyroid supplement. ? Herbal supplements that mimic thyroid hormones.  Solid or fluid-filled lumps within your thyroid gland (thyroid nodules).  Excessive ingestion of iodine.  What increases the risk?  Being female.  Having a family history of thyroid conditions. What are the signs or symptoms? Signs and symptoms of hyperthyroidism may include:  Nervousness.  Inability to tolerate heat.  Unexplained weight loss.  Diarrhea.  Change in the texture of hair or skin.  Heart skipping beats or making extra beats.  Rapid heart rate.  Loss of menstruation.  Shaky hands.  Fatigue.  Restlessness.  Increased appetite.  Sleep problems.  Enlarged thyroid gland or nodules.  How is this diagnosed? Diagnosis of hyperthyroidism may include:  Medical history and physical exam.  Blood tests.  Ultrasound tests.  How is this treated? Treatment may include:  Medicines to control your thyroid.  Surgery to remove your thyroid.  Radiation therapy.  Follow these instructions at home:  Take medicines only as directed by your health care provider.  Do not use any tobacco products, including cigarettes, chewing tobacco, or electronic cigarettes. If you need help quitting, ask your health care provider.  Do not exercise or do physical activity until your health care provider  approves.  Keep all follow-up appointments as directed by your health care provider. This is important. Contact a health care provider if:  Your symptoms do not get better with treatment.  You have fever.  You are taking thyroid replacement medicine and you: ? Have depression. ? Feel mentally and physically slow. ? Have weight gain. Get help right away if:  You have decreased alertness or a change in your awareness.  You have abdominal pain.  You feel dizzy.  You have a rapid heartbeat.  You have an irregular heartbeat. This information is not intended to replace advice given to you by your health care provider. Make sure you discuss any questions you have with your health care provider. Document Released: 04/01/2005 Document Revised: 08/31/2015 Document Reviewed: 08/17/2013 Elsevier Interactive Patient Education  2017 [employer] Back Pain, Adult Back pain is very common. The pain often gets better over time. The cause of back pain is usually not dangerous. Most people can learn to manage their back pain on their own. Follow these instructions at home: Watch your back pain for any changes. The following actions may help to lessen any pain you are feeling:  Stay active. Start with short walks on flat ground if you can. Try to walk farther each day.  Exercise regularly as told by your doctor. Exercise helps your back heal faster. It also helps avoid future injury by keeping your muscles strong and flexible.  Do not sit, drive, or stand in one place for more than 30 minutes.  Do not stay in bed. Resting more than 1-2 days can slow down your recovery.  Be careful when you bend or lift an object. Use good form  when lifting: ? Bend at your knees. ? Keep the object close to your body. ? Do not twist.  Sleep on a firm mattress. Lie on your side, and bend your knees. If you lie on your back, put a pillow under your knees.  Take medicines only as told by your doctor.  Put  ice on the injured area. ? Put ice in a plastic bag. ? Place a towel between your skin and the bag. ? Leave the ice on for 20 minutes, 2-3 times a day for the first 2-3 days. After that, you can switch between ice and heat packs.  Avoid feeling anxious or stressed. Find good ways to deal with stress, such as exercise.  Maintain a healthy weight. Extra weight puts stress on your back.  Contact a doctor if:  You have pain that does not go away with rest or medicine.  You have worsening pain that goes down into your legs or buttocks.  You have pain that does not get better in one week.  You have pain at night.  You lose weight.  You have a fever or chills. Get help right away if:  You cannot control when you poop (bowel movement) or pee (urinate).  Your arms or legs feel weak.  Your arms or legs lose feeling (numbness).  You feel sick to your stomach (nauseous) or throw up (vomit).  You have belly (abdominal) pain.  You feel like you may pass out (faint). This information is not intended to replace advice given to you by your health care provider. Make sure you discuss any questions you have with your health care provider. Document Released: 09/18/2007 Document Revised: 09/07/2015 Document Reviewed: 08/03/2013 Elsevier Interactive Patient Education  [employer].

## 2017-02-27 LAB — CBC WITH DIFFERENTIAL/PLATELET
BASOS: 0 %
Basophils Absolute: 0 10*3/uL (ref 0.0–0.2)
EOS (ABSOLUTE): 0.2 10*3/uL (ref 0.0–0.4)
EOS: 3 %
HEMATOCRIT: 38 % (ref 34.0–46.6)
HEMOGLOBIN: 12.6 g/dL (ref 11.1–15.9)
IMMATURE GRANS (ABS): 0 10*3/uL (ref 0.0–0.1)
IMMATURE GRANULOCYTES: 0 %
LYMPHS: 39 %
Lymphocytes Absolute: 1.8 10*3/uL (ref 0.7–3.1)
MCH: 27.6 pg (ref 26.6–33.0)
MCHC: 33.2 g/dL (ref 31.5–35.7)
MCV: 83 fL (ref 79–97)
MONOCYTES: 5 %
Monocytes Absolute: 0.2 10*3/uL (ref 0.1–0.9)
NEUTROS PCT: 53 %
Neutrophils Absolute: 2.4 10*3/uL (ref 1.4–7.0)
Platelets: 247 10*3/uL (ref 150–379)
RBC: 4.56 x10E6/uL (ref 3.77–5.28)
RDW: 14.4 % (ref 12.3–15.4)
WBC: 4.6 10*3/uL (ref 3.4–10.8)

## 2017-02-27 LAB — LIPID PANEL
CHOL/HDL RATIO: 4.2 ratio (ref 0.0–4.4)
CHOLESTEROL TOTAL: 200 mg/dL — AB (ref 100–199)
HDL: 48 mg/dL (ref >39)
LDL CALC: 118 mg/dL — AB (ref 0–99)
Triglycerides: 169 mg/dL — ABNORMAL HIGH (ref 0–149)
VLDL CHOLESTEROL CAL: 34 mg/dL (ref 5–40)

## 2017-02-27 LAB — CMP14+EGFR
ALBUMIN: 4.2 g/dL (ref 3.6–4.8)
ALK PHOS: 103 IU/L (ref 39–117)
ALT: 21 IU/L (ref 0–32)
AST: 17 IU/L (ref 0–40)
Albumin/Globulin Ratio: 1.6 (ref 1.2–2.2)
BUN / CREAT RATIO: 16 (ref 12–28)
BUN: 14 mg/dL (ref 8–27)
Bilirubin Total: 0.3 mg/dL (ref 0.0–1.2)
CO2: 25 mmol/L (ref 20–29)
CREATININE: 0.85 mg/dL (ref 0.57–1.00)
Calcium: 9.5 mg/dL (ref 8.7–10.3)
Chloride: 104 mmol/L (ref 96–106)
GFR calc non Af Amer: 73 mL/min/{1.73_m2} (ref >59)
GFR, EST AFRICAN AMERICAN: 84 mL/min/{1.73_m2} (ref >59)
GLOBULIN, TOTAL: 2.7 g/dL (ref 1.5–4.5)
Glucose: 103 mg/dL — ABNORMAL HIGH (ref 65–99)
Potassium: 4.4 mmol/L (ref 3.5–5.2)
SODIUM: 143 mmol/L (ref 134–144)
TOTAL PROTEIN: 6.9 g/dL (ref 6.0–8.5)

## 2017-02-27 LAB — URINALYSIS, COMPLETE
BILIRUBIN UA: NEGATIVE
GLUCOSE, UA: NEGATIVE
KETONES UA: NEGATIVE
LEUKOCYTES UA: NEGATIVE
Nitrite, UA: NEGATIVE
PROTEIN UA: NEGATIVE
RBC UA: NEGATIVE
SPEC GRAV UA: 1.015 (ref 1.005–1.030)
UUROB: 0.2 mg/dL (ref 0.2–1.0)
pH, UA: 7 (ref 5.0–7.5)

## 2017-02-27 LAB — THYROID PANEL
Free Thyroxine Index: 1.3 (ref 1.2–4.9)
T3 UPTAKE RATIO: 21 % — AB (ref 24–39)
T4, Total: 6 ug/dL (ref 4.5–12.0)

## 2017-02-27 LAB — MICROSCOPIC EXAMINATION: CASTS: NONE SEEN /LPF

## 2017-02-27 LAB — THYROID PEROXIDASE ANTIBODY: Thyroperoxidase Ab SerPl-aCnc: 81 IU/mL — ABNORMAL HIGH (ref 0–34)

## 2017-03-02 ENCOUNTER — Other Ambulatory Visit: Payer: Self-pay | Admitting: Internal Medicine

## 2017-03-02 DIAGNOSIS — E063 Autoimmune thyroiditis: Secondary | ICD-10-CM

## 2017-03-12 ENCOUNTER — Encounter: Payer: Self-pay | Admitting: Endocrinology

## 2017-03-12 ENCOUNTER — Ambulatory Visit: Payer: 59 | Admitting: Endocrinology

## 2017-03-12 VITALS — BP 124/74 | HR 68 | Ht 70.0 in | Wt 229.0 lb

## 2017-03-12 DIAGNOSIS — E0500 Thyrotoxicosis with diffuse goiter without thyrotoxic crisis or storm: Secondary | ICD-10-CM

## 2017-03-12 DIAGNOSIS — R5383 Other fatigue: Secondary | ICD-10-CM | POA: Diagnosis not present

## 2017-03-12 DIAGNOSIS — E042 Nontoxic multinodular goiter: Secondary | ICD-10-CM

## 2017-03-12 LAB — T3, FREE: T3 FREE: 3.3 pg/mL (ref 2.3–4.2)

## 2017-03-12 LAB — TSH: TSH: 0.44 u[IU]/mL (ref 0.35–4.50)

## 2017-03-12 NOTE — Progress Notes (Addendum)
Patient ID: Stacy Bryan, female   DOB: Sep 30, 1955, 61 y.o.   MRN: [id number]                                                                                                              Reason for Appointment:  Hyperthyroidism, new consultation  Referring physician:Jegede   Chief complaint: Feeling tired   History of Present Illness:    Background history: About 2 years ago she had gone to see a physician because of problems with weight gain and fatigue that had been going on for a few months Also she was having some palpitations at that time but no sweating or heat intolerance or shakiness Not clear if she had a goiter in the initial exam and no records are available including baseline labs. However her TSH was low in 09/2011 She was told to have Hyperthyroidism and started on methimazole 5 mg daily, probably after her scan in 6/17 However she was not followed for too long and has been mostly followed by PCP with no change in her methimazole and only irregular labs and follow-up  RECENT history:  She was seen by her PCP in November for periodic follow-up and was complaining about fatigue and difficulty losing weight She was evaluated with the free thyroxine index only and this was low normal Also TPO antibody was increased and she has been referred here for further management  Patient said that she has been having fatigue and may wake up feeling tired also She does not think she has any mood changes or depression She generally feels warm but does not have any new heat intolerance or sweating Only rare palpitations present Her hair is thinning but she does not have any increased hair loss recently  Patient is also complaining about prominence of the right eye for about a year without any local irritation or discomfort.  She is due to see an ophthalmologist this Friday  Wt Readings from Last 3 Encounters:  03/12/17 229 lb (103.9 kg)  02/26/17 230 lb (104.3 kg)  01/10/17 228  lb 1.6 oz (103.5 kg)     Treatments so far: Methimazole 5 mg daily   Thyroid function tests as follows:     TSH on 08/23/15 was 0.096  Lab Results  Component Value Date   FREET4 0.82 10/08/2013   FREET4 0.84 10/02/2011   TSH 0.495 10/08/2013   TSH 0.175 (L) 10/02/2011    No results found for: [place] done in 7/18 showed uptake of 40.1% and multinodular goiter   Allergies as of 03/12/2017   No Known Allergies     Medication List        Accurate as of 03/12/17  4:02 PM. Always use your most recent med list.          aspirin EC 81 MG tablet Take 81 mg by mouth daily.   calcium carbonate 420 MG Chew chewable tablet Commonly known as:  [place] by mouth.   cyclobenzaprine 5 MG tablet Commonly known as:  FLEXERIL Take 1 tablet (5 mg total) 3 (three) times daily by mouth.   diclofenac sodium 1 % Gel Commonly known as:  VOLTAREN Apply 2 g 4 (four) times daily topically.   fish oil-omega-3 fatty acids 1000 MG capsule Take 2 g by mouth daily.   hydrochlorothiazide 12.5 MG capsule Commonly known as:  MICROZIDE Take 1 capsule (12.5 mg total) daily by mouth. Need office visit for more refills   ibuprofen 800 MG tablet Commonly known as:  ADVIL,MOTRIN Take 1 tablet (800 mg total) every 8 (eight) hours as needed by mouth.   magnesium oxide 400 MG tablet Commonly known as:  MAG-OX Take 400 mg by mouth daily.   meclizine 25 MG tablet Commonly known as:  ANTIVERT Take 1 tablet (25 mg total) every 6 (six) hours as needed by mouth for dizziness.   mupirocin ointment 2 % Commonly known as:  BACTROBAN Place 1 application into the nose 2 (two) times daily as needed. Wound   omega-3 acid ethyl esters 1 g capsule Commonly known as:  LOVAZA Take 1 g by mouth daily.   VITAMIN D-3 PO Take 400 Units by mouth daily.   ZINC 15 PO Take 15 mg by mouth daily.           Past Medical History:  Diagnosis Date  . Abnormal Pap smear   . Allergy     . Hyperlipidemia   . Hypertension   . Thyroid disease     Past Surgical History:  Procedure Laterality Date  . ABDOMINAL HYSTERECTOMY  1997   total  . FOOT SURGERY     both feet  20 yrs ago  . OVARY SURGERY  1980's   removal of R ovary (cyst)    Family History  Problem Relation Age of Onset  . Hypertension Mother   . Heart disease Father   . Asthma Father   . Hyperlipidemia Father   . Asthma Sister   . Breast cancer Sister   . Thyroid disease Sister        Surgery  . Heart disease Sister   . Diabetes Sister   . Gout Sister   . Diabetes Sister   . Ulcers Sister   . Cancer Maternal Grandmother     Social History:  reports that she quit smoking about 10 years ago. she has never used smokeless tobacco. She reports that she does not drink alcohol or use drugs.  Allergies: No Known Allergies   Review of Systems  Constitutional: Negative for weight gain.  HENT: Negative for headaches.   Eyes: Negative for blurred vision.  Respiratory: Negative for shortness of breath.   Cardiovascular: Positive for palpitations and leg swelling.  Gastrointestinal: Negative for abdominal pain.  Endocrine: Positive for fatigue and heat intolerance.  Musculoskeletal: Negative for joint pain.  Skin: Negative for rash.  Neurological: Negative for weakness.  Psychiatric/Behavioral: Negative for nervousness and insomnia.      Examination:   BP 124/74   Pulse 68   Ht 5\' 10"  (1.778 m)   Wt 229 lb (103.9 kg)   SpO2 96%   BMI 32.86 kg/m    General Appearance:  well-built and nourished, pleasant, not anxious or hyperkinetic.         Eyes:  she has a mild prominence of the right eye with mild upper lid retraction No lid lag or stare Measurement with exophthalmomter shows right eye to be about 21 mm and left eye about 19 Neck: The thyroid is enlarged about  1. 5-2  times normal on the left and nodular, right lobe is just palpable and smooth  There is no lymphadenopathy in the neck .            Heart: normal S1 and S2, no murmurs .          Lungs: breath sounds are cleary Abdomen: no hepatosplenomegaly or other palpable abnormality  Extremities: hands are warm. No significant ankle edema.  Neurological: No  fine tremors are present. Deep tendon reflexes at biceps are showing slow relaxation, ankle reflexes difficult to elicit.  Skin: No rash, no pigmented lesions   Assessment/Plan:   Hyperthyroidism,  diagnosed in 2017 as toxic nodular goiter  She has been maintained on methimazole for at least a year and a half without any consistent follow-up Also appears not to have had any significant subjective improvement in her fatigue, only appears to had some reduction palpitations from taking this treatment Baseline labs are not available No follow-up TSH has been done since starting treatment, no records available from treating endocrinologist   Although she is having nonspecific fatigue which is chronic her exam indicates possible mild hypothyroidism Her exam shows a small multinodular goiter  The plan is to check her thyroid levels today and decide on further treatment, follow-up in 6 weeks Also patient will bring records from her previous physician and this will be reviewed on the next visit  EXOPHTHALMOS on the right: This is mild and asymptomatic and present for the last year This may be related to Graves' disease and unrelated to the toxic nodular goiter that she has had She does have family history of probable Graves' disease also in her niece  Will check thyrotropin receptor antibody to confirm and she will also discuss with her ophthalmologist this Friday  FATIGUE and tendency to weight gain: Not clear this is related to some degree of insomnia, she will discuss with PCP  Consult note sent to referring physician  [facility] 03/12/2017, 4:02 PM   The thyroid level is high normal and we will need to consider radioactive iodine treatment.  We will first  need to do the radioactive iodine uptake test and if okay with her we can refer her for this.  She will need to stop her methimazole 7 days prior to this test   Note: This office note was prepared with Estate agent. Any transcriptional errors that result from this process are unintentional.

## 2017-03-12 NOTE — Addendum Note (Signed)
Addended by: Elayne Snare on: 03/12/2017 09:50 PM   Modules accepted: Orders

## 2017-03-13 LAB — THYROTROPIN RECEPTOR AUTOABS

## 2017-03-21 ENCOUNTER — Telehealth: Payer: Self-pay | Admitting: *Deleted

## 2017-03-21 NOTE — Telephone Encounter (Signed)
Patient is aware of labs being normal except for cholesterol being high. Patient advised to limit saturated fat intake and to increase fiber and exercise. Patient also aware of being referred to an endocrinologist for TSH medication management. Medical Assistant left message on patient's home and cell voicemail. Voicemail states to give a call back to [place] with [facility] at [phone].

## 2017-03-21 NOTE — Telephone Encounter (Signed)
-----   Message from Tresa Garter, MD sent at 03/02/2017 10:03 AM EST ----- Please inform patient that her lab results are mostly normal except for her cholesterol level, still high but better than before. Please continue your cholesterol medicines and also please limit saturated fat to no more than 7% of your calories, limit cholesterol to 200 mg/day, increase fiber and exercise as tolerated. If needed we may add another cholesterol lowering medication to your regimen. The thyroid function is abnormal, will refer to endocrinologist to determine ongoing use of hyperthyroid medicine.

## 2017-03-31 ENCOUNTER — Ambulatory Visit (HOSPITAL_COMMUNITY): Payer: 59

## 2017-04-01 ENCOUNTER — Encounter (HOSPITAL_COMMUNITY): Payer: 59

## 2017-04-22 ENCOUNTER — Ambulatory Visit: Payer: 59 | Admitting: Vascular Surgery

## 2017-04-23 ENCOUNTER — Ambulatory Visit: Payer: 59 | Admitting: Endocrinology

## 2017-04-29 DIAGNOSIS — J029 Acute pharyngitis, unspecified: Secondary | ICD-10-CM | POA: Diagnosis not present

## 2017-04-29 DIAGNOSIS — E7800 Pure hypercholesterolemia, unspecified: Secondary | ICD-10-CM | POA: Diagnosis not present

## 2017-04-29 DIAGNOSIS — E0590 Thyrotoxicosis, unspecified without thyrotoxic crisis or storm: Secondary | ICD-10-CM | POA: Diagnosis not present

## 2017-05-01 ENCOUNTER — Ambulatory Visit (HOSPITAL_COMMUNITY)
Admission: RE | Admit: 2017-05-01 | Discharge: 2017-05-01 | Disposition: A | Discharge status: Home / Self Care | Payer: 59 | Source: Ambulatory Visit | Attending: Endocrinology | Admitting: Endocrinology

## 2017-05-01 DIAGNOSIS — E042 Nontoxic multinodular goiter: Secondary | ICD-10-CM | POA: Diagnosis not present

## 2017-05-01 MED ORDER — SODIUM IODIDE I 131 CAPSULE
11.6000 | Freq: Once | INTRAVENOUS | Status: AC | PRN
  Administered 2017-05-01: 11.6 via ORAL

## 2017-05-02 ENCOUNTER — Ambulatory Visit (HOSPITAL_COMMUNITY)
Admission: RE | Admit: 2017-05-02 | Discharge: 2017-05-02 | Disposition: A | Discharge status: Home / Self Care | Payer: 59 | Source: Ambulatory Visit | Attending: Endocrinology | Admitting: Endocrinology

## 2017-05-02 DIAGNOSIS — E0590 Thyrotoxicosis, unspecified without thyrotoxic crisis or storm: Secondary | ICD-10-CM | POA: Diagnosis not present

## 2017-05-04 ENCOUNTER — Other Ambulatory Visit: Payer: Self-pay | Admitting: Endocrinology

## 2017-05-04 DIAGNOSIS — E0520 Thyrotoxicosis with toxic multinodular goiter without thyrotoxic crisis or storm: Secondary | ICD-10-CM

## 2017-05-06 ENCOUNTER — Telehealth: Payer: Self-pay | Admitting: Endocrinology

## 2017-05-06 NOTE — Telephone Encounter (Signed)
Patient is returning your phone call. 

## 2017-05-12 ENCOUNTER — Ambulatory Visit: Payer: 59 | Admitting: Endocrinology

## 2017-05-16 NOTE — Telephone Encounter (Signed)
This has been resolved

## 2017-05-27 ENCOUNTER — Other Ambulatory Visit: Payer: Self-pay

## 2017-05-27 ENCOUNTER — Ambulatory Visit (INDEPENDENT_AMBULATORY_CARE_PROVIDER_SITE_OTHER): Payer: 59 | Admitting: Vascular Surgery

## 2017-05-27 ENCOUNTER — Encounter: Payer: Self-pay | Admitting: Vascular Surgery

## 2017-05-27 VITALS — BP 126/82 | HR 68 | Temp 98.8°F | Resp 14 | Ht 70.0 in | Wt 225.0 lb

## 2017-05-27 DIAGNOSIS — I872 Venous insufficiency (chronic) (peripheral): Secondary | ICD-10-CM

## 2017-05-27 NOTE — Progress Notes (Signed)
History of Present Illness:  Patient is a 62 y.o. year old female who presents for follow up exam in regards to venous reflux.  She has been wearing compression garments and using elevation when at rest.  She states the swelling is no worse, but is not sure if it has improved at all.    No change in her medical history since her last visit.  She takes a daily 81 mg aspirin and HCTZ for mild edema with chronic venous hypertension.       Past Medical History:  Diagnosis Date  . Abnormal Pap smear   . Allergy   . Hyperlipidemia   . Hypertension   . Thyroid disease     Past Surgical History:  Procedure Laterality Date  . ABDOMINAL HYSTERECTOMY  1997   total  . FOOT SURGERY     both feet  20 yrs ago  . OVARY SURGERY  1980's   removal of R ovary (cyst)     Social History Social History   Tobacco Use  . Smoking status: Former Smoker    Last attempt to quit: 06/02/2006    Years since quitting: 10.9  . Smokeless tobacco: Never Used  Substance Use Topics  . Alcohol use: No    Comment: occassionally  . Drug use: No    Family History Family History  Problem Relation Age of Onset  . Hypertension Mother   . Heart disease Father   . Asthma Father   . Hyperlipidemia Father   . Asthma Sister   . Breast cancer Sister   . Thyroid disease Sister        Surgery  . Heart disease Sister   . Diabetes Sister   . Gout Sister   . Diabetes Sister   . Ulcers Sister   . Cancer Maternal Grandmother     Allergies  No Known Allergies   Current Outpatient Medications  Medication Sig Dispense Refill  . aspirin EC 81 MG tablet Take 81 mg by mouth daily.    . calcium carbonate (TITRALAC) 420 MG CHEW chewable tablet Chew by mouth.    . Cholecalciferol (VITAMIN D-3 PO) Take 400 Units by mouth daily.     . cyclobenzaprine (FLEXERIL) 5 MG tablet Take 1 tablet (5 mg total) 3 (three) times daily by mouth. 60 tablet 3  . fish oil-omega-3 fatty acids 1000 MG capsule Take 2 g by mouth  daily.    . hydrochlorothiazide (MICROZIDE) 12.5 MG capsule Take 1 capsule (12.5 mg total) daily by mouth. Need office visit for more refills 30 capsule 3  . ibuprofen (ADVIL,MOTRIN) 800 MG tablet Take 1 tablet (800 mg total) every 8 (eight) hours as needed by mouth. 60 tablet 0  . magnesium oxide (MAG-OX) 400 MG tablet Take 400 mg by mouth daily.     . meclizine (ANTIVERT) 25 MG tablet Take 1 tablet (25 mg total) every 6 (six) hours as needed by mouth for dizziness. 30 tablet 1  . mupirocin ointment (BACTROBAN) 2 % Place 1 application into the nose 2 (two) times daily as needed. Wound    . omega-3 acid ethyl esters (LOVAZA) 1 g capsule Take 1 g by mouth daily.     . Zinc Sulfate (ZINC 15 PO) Take 15 mg by mouth daily.     . diclofenac sodium (VOLTAREN) 1 % GEL Apply 2 g 4 (four) times daily topically. (Patient not taking: Reported on 05/27/2017) 1 Tube 1   No current facility-administered medications  for this visit.     ROS:   General:  No weight loss, Fever, chills  HEENT: No recent headaches, no nasal bleeding, no visual changes, no sore throat  Neurologic: No dizziness, blackouts, seizures. No recent symptoms of stroke or mini- stroke. No recent episodes of slurred speech, or temporary blindness.  Cardiac: No recent episodes of chest pain/pressure, no shortness of breath at rest.  No shortness of breath with exertion.  Denies history of atrial fibrillation or irregular heartbeat  Vascular: No history of rest pain in feet.  No history of claudication.  No history of non-healing ulcer, No history of DVT   Pulmonary: No home oxygen, no productive cough, no hemoptysis,  No asthma or wheezing  Musculoskeletal:  [ ]  Arthritis, [ ]  Low back pain,  [ ]  Joint pain  Hematologic:No history of hypercoagulable state.  No history of easy bleeding.  No history of anemia  Gastrointestinal: No hematochezia or melena,  No gastroesophageal reflux, no trouble swallowing  Urinary: [ ]  chronic Kidney  disease, [ ]  on HD - [ ]  MWF or [ ]  TTHS, [ ]  Burning with urination, [ ]  Frequent urination, [ ]  Difficulty urinating;   Skin: No rashes  Psychological: No history of anxiety,  No history of depression   Physical Examination  Vitals:   05/27/17 1425  BP: 126/82  Pulse: 68  Resp: 14  Temp: 98.8 F (37.1 C)  TempSrc: Oral  SpO2: 95%  Weight: 225 lb (102.1 kg)  Height: 5\' 10"  (1.778 m)    Body mass index is 32.28 kg/m.  General:  Alert and oriented, no acute distress HEENT: Normal, normocephalic  Neck: No bruit or JVD Pulmonary: Clear to auscultation bilaterally Cardiac: Regular Rate and Rhythm without murmur Gastrointestinal: Soft, non-tender, non-distended, no mass, no scars Skin: No rash Extremity Pulses:  DP/PT  pulses bilaterally Musculoskeletal: No deformity, minimal edema bilaterally without skin changes  Neurologic: Upper and lower extremity motor 5/5 and symmetric  Dr. Donnetta Hutching performed in room venous duplex Left GSV LE 0.45 cm, right 0.40   ASSESSMENT:  Chronic venous insufficieny   PLAN: The patient has no skin changes and no history of ulcers.  She has palpable pedal pulses B.  He venous duplex reveals venous reflux L > R LE.  At this time we recommend she continue wearing compression garments and elevation of the LE when at rest.  She is not at risk of tissue or limb loss.  She will follow up in the future if her symptoms progress.    Roxy Horseman PA-C [facility]  The patient was seen in conjunction with Dr. Donnetta Hutching today  I have examined the patient, reviewed and agree with above.  She has swelling in her left leg but no significant distention.  Recommended continued knee-high compression as needed but do not feel that she would benefit greatly from ablation.  Also explained that with the size of her vein would not be covered by insurance.  She was reassured with this discussion will see [place] again on as-needed  basis  Curt Jews, MD 05/27/2017 3:19 PM

## 2017-06-04 ENCOUNTER — Other Ambulatory Visit: Payer: Self-pay

## 2017-06-04 ENCOUNTER — Ambulatory Visit: Payer: 59 | Attending: Internal Medicine | Admitting: Internal Medicine

## 2017-06-04 ENCOUNTER — Encounter: Payer: Self-pay | Admitting: Internal Medicine

## 2017-06-04 VITALS — BP 125/84 | HR 72 | Temp 98.4°F | Resp 12 | Ht 70.0 in | Wt 228.2 lb

## 2017-06-04 DIAGNOSIS — Z7982 Long term (current) use of aspirin: Secondary | ICD-10-CM | POA: Insufficient documentation

## 2017-06-04 DIAGNOSIS — E0590 Thyrotoxicosis, unspecified without thyrotoxic crisis or storm: Secondary | ICD-10-CM | POA: Diagnosis not present

## 2017-06-04 DIAGNOSIS — R9431 Abnormal electrocardiogram [ECG] [EKG]: Secondary | ICD-10-CM | POA: Diagnosis not present

## 2017-06-04 DIAGNOSIS — Z0000 Encounter for general adult medical examination without abnormal findings: Secondary | ICD-10-CM

## 2017-06-04 DIAGNOSIS — E785 Hyperlipidemia, unspecified: Secondary | ICD-10-CM | POA: Diagnosis not present

## 2017-06-04 DIAGNOSIS — H0520 Unspecified exophthalmos: Secondary | ICD-10-CM | POA: Insufficient documentation

## 2017-06-04 DIAGNOSIS — Z87891 Personal history of nicotine dependence: Secondary | ICD-10-CM | POA: Diagnosis not present

## 2017-06-04 DIAGNOSIS — J320 Chronic maxillary sinusitis: Secondary | ICD-10-CM | POA: Diagnosis not present

## 2017-06-04 DIAGNOSIS — I872 Venous insufficiency (chronic) (peripheral): Secondary | ICD-10-CM

## 2017-06-04 DIAGNOSIS — I10 Essential (primary) hypertension: Secondary | ICD-10-CM | POA: Diagnosis not present

## 2017-06-04 DIAGNOSIS — M545 Low back pain: Secondary | ICD-10-CM | POA: Diagnosis not present

## 2017-06-04 DIAGNOSIS — R079 Chest pain, unspecified: Secondary | ICD-10-CM | POA: Diagnosis not present

## 2017-06-04 DIAGNOSIS — Z79899 Other long term (current) drug therapy: Secondary | ICD-10-CM | POA: Insufficient documentation

## 2017-06-04 DIAGNOSIS — R42 Dizziness and giddiness: Secondary | ICD-10-CM

## 2017-06-04 DIAGNOSIS — H538 Other visual disturbances: Secondary | ICD-10-CM

## 2017-06-04 DIAGNOSIS — R0609 Other forms of dyspnea: Secondary | ICD-10-CM | POA: Diagnosis not present

## 2017-06-04 DIAGNOSIS — R5383 Other fatigue: Secondary | ICD-10-CM | POA: Diagnosis not present

## 2017-06-04 DIAGNOSIS — R002 Palpitations: Secondary | ICD-10-CM | POA: Diagnosis not present

## 2017-06-04 NOTE — Progress Notes (Signed)
Follow up OV Chest pain depends on over exertion x 2 months  Headache intermittent progressing to back of neck

## 2017-06-04 NOTE — Patient Instructions (Signed)
Managing Your Hypertension Hypertension is commonly called high blood pressure. This is when the force of your blood pressing against the walls of your arteries is too strong. Arteries are blood vessels that carry blood from your heart throughout your body. Hypertension forces the heart to work harder to pump blood, and may cause the arteries to become narrow or stiff. Having untreated or uncontrolled hypertension can cause heart attack, stroke, kidney disease, and other problems. What are blood pressure readings? A blood pressure reading consists of a higher number over a lower number. Ideally, your blood pressure should be below 120/80. The first ("top") number is called the systolic pressure. It is a measure of the pressure in your arteries as your heart beats. The second ("bottom") number is called the diastolic pressure. It is a measure of the pressure in your arteries as the heart relaxes. What does my blood pressure reading mean? Blood pressure is classified into four stages. Based on your blood pressure reading, your health care provider may use the following stages to determine what type of treatment you need, if any. Systolic pressure and diastolic pressure are measured in a unit called mm Hg. Normal  Systolic pressure: below 120.  Diastolic pressure: below 80. Elevated  Systolic pressure: 120-129.  Diastolic pressure: below 80. Hypertension stage 1  Systolic pressure: 130-139.  Diastolic pressure: 80-89. Hypertension stage 2  Systolic pressure: 140 or above.  Diastolic pressure: 90 or above. What health risks are associated with hypertension? Managing your hypertension is an important responsibility. Uncontrolled hypertension can lead to:  A heart attack.  A stroke.  A weakened blood vessel (aneurysm).  Heart failure.  Kidney damage.  Eye damage.  Metabolic syndrome.  Memory and concentration problems.  What changes can I make to manage my  hypertension? Hypertension can be managed by making lifestyle changes and possibly by taking medicines. Your health care provider will help you make a plan to bring your blood pressure within a normal range. Eating and drinking  Eat a diet that is high in fiber and potassium, and low in salt (sodium), added sugar, and fat. An example eating plan is called the DASH (Dietary Approaches to Stop Hypertension) diet. To eat this way: ? Eat plenty of fresh fruits and vegetables. Try to fill half of your plate at each meal with fruits and vegetables. ? Eat whole grains, such as whole wheat pasta, brown rice, or whole grain bread. Fill about one quarter of your plate with whole grains. ? Eat low-fat diary products. ? Avoid fatty cuts of meat, processed or cured meats, and poultry with skin. Fill about one quarter of your plate with lean proteins such as fish, chicken without skin, beans, eggs, and tofu. ? Avoid premade and processed foods. These tend to be higher in sodium, added sugar, and fat.  Reduce your daily sodium intake. Most people with hypertension should eat less than 1,500 mg of sodium a day.  Limit alcohol intake to no more than 1 drink a day for nonpregnant women and 2 drinks a day for men. One drink equals 12 oz of beer, 5 oz of wine, or 1 oz of hard liquor. Lifestyle  Work with your health care provider to maintain a healthy body weight, or to lose weight. Ask what an ideal weight is for you.  Get at least 30 minutes of exercise that causes your heart to beat faster (aerobic exercise) most days of the week. Activities may include walking, swimming, or biking.  Include exercise   to strengthen your muscles (resistance exercise), such as weight lifting, as part of your weekly exercise routine. Try to do these types of exercises for 30 minutes at least 3 days a week.  Do not use any products that contain nicotine or tobacco, such as cigarettes and e-cigarettes. If you need help quitting, ask  your health care provider.  Control any long-term (chronic) conditions you have, such as high cholesterol or diabetes. Monitoring  Monitor your blood pressure at home as told by your health care provider. Your personal target blood pressure may vary depending on your medical conditions, your age, and other factors.  Have your blood pressure checked regularly, as often as told by your health care provider. Working with your health care provider  Review all the medicines you take with your health care provider because there may be side effects or interactions.  Talk with your health care provider about your diet, exercise habits, and other lifestyle factors that may be contributing to hypertension.  Visit your health care provider regularly. Your health care provider can help you create and adjust your plan for managing hypertension. Will I need medicine to control my blood pressure? Your health care provider may prescribe medicine if lifestyle changes are not enough to get your blood pressure under control, and if:  Your systolic blood pressure is 130 or higher.  Your diastolic blood pressure is 80 or higher.  Take medicines only as told by your health care provider. Follow the directions carefully. Blood pressure medicines must be taken as prescribed. The medicine does not work as well when you skip doses. Skipping doses also puts you at risk for problems. Contact a health care provider if:  You think you are having a reaction to medicines you have taken.  You have repeated (recurrent) headaches.  You feel dizzy.  You have swelling in your ankles.  You have trouble with your vision. Get help right away if:  You develop a severe headache or confusion.  You have unusual weakness or numbness, or you feel faint.  You have severe pain in your chest or abdomen.  You vomit repeatedly.  You have trouble breathing. Summary  Hypertension is when the force of blood pumping through  your arteries is too strong. If this condition is not controlled, it may put you at risk for serious complications.  Your personal target blood pressure may vary depending on your medical conditions, your age, and other factors. For most people, a normal blood pressure is less than 120/80.  Hypertension is managed by lifestyle changes, medicines, or both. Lifestyle changes include weight loss, eating a healthy, low-sodium diet, exercising more, and limiting alcohol. This information is not intended to replace advice given to you by your health care provider. Make sure you discuss any questions you have with your health care provider. Document Released: 12/25/2011 Document Revised: 02/28/2016 Document Reviewed: 02/28/2016 Elsevier Interactive Patient Education  2018 [employer].  Fatigue Fatigue is feeling tired all of the time, a lack of energy, or a lack of motivation. Occasional or mild fatigue is often a normal response to activity or life in general. However, long-lasting (chronic) or extreme fatigue may indicate an underlying medical condition. Follow these instructions at home: Watch your fatigue for any changes. The following actions may help to lessen any discomfort you are feeling:  Talk to your health care provider about how much sleep you need each night. Try to get the required amount every night.  Take medicines only as directed  by your health care provider.  Eat a healthy and nutritious diet. Ask your health care provider if you need help changing your diet.  Drink enough fluid to keep your urine clear or pale yellow.  Practice ways of relaxing, such as yoga, meditation, massage therapy, or acupuncture.  Exercise regularly.  Change situations that cause you stress. Try to keep your work and personal routine reasonable.  Do not abuse illegal drugs.  Limit alcohol intake to no more than 1 drink per day for nonpregnant women and 2 drinks per day for men. One drink equals 12  ounces of beer, 5 ounces of wine, or 1 ounces of hard liquor.  Take a multivitamin, if directed by your health care provider.  Contact a health care provider if:  Your fatigue does not get better.  You have a fever.  You have unintentional weight loss or gain.  You have headaches.  You have difficulty: ? Falling asleep. ? Sleeping throughout the night.  You feel angry, guilty, anxious, or sad.  You are unable to have a bowel movement (constipation).  You skin is dry.  Your legs or another part of your body is swollen. Get help right away if:  You feel confused.  Your vision is blurry.  You feel faint or pass out.  You have a severe headache.  You have severe abdominal, pelvic, or back pain.  You have chest pain, shortness of breath, or an irregular or fast heartbeat.  You are unable to urinate or you urinate less than normal.  You develop abnormal bleeding, such as bleeding from the rectum, vagina, nose, lungs, or nipples.  You vomit blood.  You have thoughts about harming yourself or committing suicide.  You are worried that you might harm someone else. This information is not intended to replace advice given to you by your health care provider. Make sure you discuss any questions you have with your health care provider. Document Released: 01/27/2007 Document Revised: 09/07/2015 Document Reviewed: 08/03/2013 Elsevier Interactive Patient Education  [employer].

## 2017-06-04 NOTE — Progress Notes (Signed)
Subjective:  Patient ID: Stacy Bryan, female    DOB: 10/24/1955  Age: 62 y.o. MRN: [id number]  CC: 3 month follow-up  HPI Stacy Bryan is a 62 y/o female with medical hx of HTN, Dyslipidemia, Hyperthyroidism, GERD, Vertigo, and Venous Insufficiency. She presents today for 3 month f/u on chronic health conditions, headache, and fatigue.   Fatigue: Patient c/o increased fatigue since her last visitation. Reports generalized weakness upon waking in the morning. She states as the day progresses she feels increasingly weak. Reports intermittent palpitations with chest pain and sob. Reports dyspnea on exertion; reports feeling exhausted while showering. Reports onset of dizziness two weeks ago. She describes the episode as her chronic vertigo. She states she has increased vertigo when she sleeps on her left side. She could attribute the dizziness to her vertigo, because she knew she slept on her left side that previous evening. Denies lightheadedness, near syncope, or falls.   Headache: Reports a headache for the last 2-4 weeks. The headache is a throbbing pressure like sensation (8-9/10) localized at the occipital region. She reports the pain is more prominent at the left occipital. Denies blurred vision or diplopia with headache. Denies nausea or vomiting with the headache. She reports the headache is inconsistent with activity; occurs at any time. However, patient has noticed her headache does occur more often in the evening. Reports minimal to moderate relief with OTC medications. She states that she does not like taking a lot of medication due to her other health conditions. Last head CT scan was performed 01/23/17: IMPRESSION: 1. No acute intracranial pathology seen on CT. 2. Mild cortical volume loss and scattered small vessel ischemic microangiopathy.  HTN/Dyslipidemia: Patient adherent to medication regimen. She recently changed her diet, due to her last lipid panel readings and A1c.  She has reduced her sugar, salt, and fried food intake. She has joined the [employer] for daily exercises. However, reports not going as much as she would like due to her fatigue. She does not keep a log of her blood pressures. Normal range from medical appointments are 120-130's/ 80-90's.   Venous Insufficiency: Patient reports chronic pedal edema in her legs, bilaterally. She was seen by vascular surgery, Dr. Donnetta Hutching, on 01/10/17 and informed she had a "leaky vein". She was instructed to wear compression stockings to reduce her pedal edema. She recently returned to vascular on 05/27/17; noted improvement in her swelling. She was told to continue wearing stockings periodically when swelling is present. Reports intermittent numbness and tingling in her legs, bilaterally. Symptoms improve when she removes her stockings. Denies reduces/loss of sensation in lower extremities or claudication.  Hyperthyroidism: Patient adherent with medication regimen (methimazole 5 mg). She is being followed by Dr. Dwyane Dee with Endocrinology. She was last seen 03/12/18 and recommended for radioactive Iodine therapy. She is scheduled to have radioactive Iodine therapy this Friday, 06/06/17. Reports continued heat intolerance, thinning of hair, and brittle nails. Denies excessive sweating at this time. Reports blurred vision and noted R eye exophthalmos on last exam. She is not currently being followed by Ophthalmology.   Health Maintenance: Patient received her flu vaccination in October 2018 through work. She request referral for colonoscopy and Ophthalmology. Patient has past surgical hx of a total hysterectomy; pap smear declined.   Past Medical History:  Diagnosis Date  . Abnormal Pap smear   . Allergy   . Hyperlipidemia   . Hypertension   . Thyroid disease    Past Surgical History:  Procedure Laterality Date  . ABDOMINAL HYSTERECTOMY  1997   total  . FOOT SURGERY     both feet  20 yrs ago  . OVARY  SURGERY  1980's   removal of R ovary (cyst)   Patient Active Problem List   Diagnosis Date Noted  . Hyperthyroidism 02/26/2017  . Vertigo 02/26/2017  . Chronic venous insufficiency 01/10/2017  . Varicose veins of both lower extremities with complications 16/04/930  . Preventative health care 07/04/2014  . Chronic maxillary sinusitis 07/04/2014  . Midline low back pain without sciatica 10/08/2013  . Encounter to establish care 10/08/2013  . Other specified hypothyroidism 10/08/2013  . Breast cancer screening 10/08/2013  . Colon cancer screening 10/08/2013  . Subclinical hypothyroidism 10/02/2011  . HTN (hypertension), benign 06/01/2011  . Dyslipidemia 06/01/2011  . GERD (gastroesophageal reflux disease) 06/01/2011  . History of seasonal allergies 06/01/2011   Social History   Socioeconomic History  . Marital status: Single    Spouse name: Not on file  . Number of children: Not on file  . Years of education: Not on file  . Highest education level: Not on file  Social Needs  . Financial resource strain: Not on file  . Food insecurity - worry: Not on file  . Food insecurity - inability: Not on file  . Transportation needs - medical: Not on file  . Transportation needs - non-medical: Not on file  Occupational History  . Not on file  Tobacco Use  . Smoking status: Former Smoker    Last attempt to quit: 06/02/2006    Years since quitting: 11.0  . Smokeless tobacco: Never Used  Substance and Sexual Activity  . Alcohol use: No    Comment: occassionally  . Drug use: No  . Sexual activity: Not on file  Other Topics Concern  . Not on file  Social History Narrative  . Not on file   Outpatient Medications Prior to Visit  Medication Sig Dispense Refill  . aspirin EC 81 MG tablet Take 81 mg by mouth daily.    . calcium carbonate (TITRALAC) 420 MG CHEW chewable tablet Chew by mouth.    . Cholecalciferol (VITAMIN D-3 PO) Take 400 Units by mouth daily.     . cyclobenzaprine  (FLEXERIL) 5 MG tablet Take 1 tablet (5 mg total) 3 (three) times daily by mouth. 60 tablet 3  . fish oil-omega-3 fatty acids 1000 MG capsule Take 2 g by mouth daily.    . hydrochlorothiazide (MICROZIDE) 12.5 MG capsule Take 1 capsule (12.5 mg total) daily by mouth. Need office visit for more refills 30 capsule 3  . ibuprofen (ADVIL,MOTRIN) 800 MG tablet Take 1 tablet (800 mg total) every 8 (eight) hours as needed by mouth. 60 tablet 0  . magnesium oxide (MAG-OX) 400 MG tablet Take 400 mg by mouth daily.     . meclizine (ANTIVERT) 25 MG tablet Take 1 tablet (25 mg total) every 6 (six) hours as needed by mouth for dizziness. 30 tablet 1  . Zinc Sulfate (ZINC 15 PO) Take 15 mg by mouth daily.     . mupirocin ointment (BACTROBAN) 2 % Place 1 application into the nose 2 (two) times daily as needed. Wound    . diclofenac sodium (VOLTAREN) 1 % GEL Apply 2 g 4 (four) times daily topically. (Patient not taking: Reported on 05/27/2017) 1 Tube 1  . omega-3 acid ethyl esters (LOVAZA) 1 g capsule Take 1 g by mouth daily.      No  facility-administered medications prior to visit.    No Known Allergies  ROS Review of Systems  Constitutional: Positive for activity change and fatigue. Negative for appetite change, chills, fever and unexpected weight change.  Eyes: Positive for visual disturbance. Negative for photophobia, pain and discharge.  Respiratory: Positive for shortness of breath. Negative for cough, chest tightness and wheezing.   Cardiovascular: Positive for chest pain, palpitations and leg swelling.  Gastrointestinal: Negative for abdominal distention, abdominal pain, blood in stool, diarrhea, nausea and vomiting.  Endocrine: Positive for heat intolerance. Negative for cold intolerance, polydipsia, polyphagia and polyuria.  Genitourinary: Negative for difficulty urinating and hematuria.  Musculoskeletal: Negative for arthralgias, joint swelling and myalgias.  Skin: Negative for color change, rash  and wound.  Neurological: Positive for dizziness, weakness, numbness and headaches. Negative for tremors, syncope and light-headedness.  Psychiatric/Behavioral: Negative for confusion, decreased concentration, dysphoric mood and sleep disturbance. The patient is not nervous/anxious.   All other systems reviewed and are negative.  Objective:  BP 125/84 (BP Location: Left Arm, Patient Position: Sitting, Cuff Size: Large)   Pulse 72   Temp 98.4 F (36.9 C) (Oral)   Resp 12   Ht 5\' 10"  (1.778 m)   Wt 228 lb 3.2 oz (103.5 kg)   SpO2 96%   BMI 32.74 kg/m   BP/Weight 06/04/2017 05/27/2017 10/09/9483  Systolic BP 462 703 500  Diastolic BP 84 82 74  Wt. (Lbs) 228.2 225 229  BMI 32.74 32.28 32.86   Lab Results  Component Value Date   TSH 0.44 03/12/2017   Lab Results  Component Value Date   CHOL 200 (H) 02/26/2017   HDL 48 02/26/2017   LDLCALC 118 (H) 02/26/2017   TRIG 169 (H) 02/26/2017   CHOLHDL 4.2 02/26/2017   Lab Results  Component Value Date   HGBA1C 6.2 02/26/2017   Physical Exam  Constitutional: She is oriented to person, place, and time. She appears well-developed and well-nourished. No distress.  HENT:  Head: Normocephalic and atraumatic.  Eyes: Conjunctivae and EOM are normal. Pupils are equal, round, and reactive to light. Right eye exhibits no nystagmus. Left eye exhibits no nystagmus. Pupils are equal.  exophthalmos presentation of R eye.   Neck: Trachea normal, normal range of motion and full passive range of motion without pain. Neck supple. No JVD present. No spinous process tenderness and no muscular tenderness present. Carotid bruit is not present. Thyromegaly present.  Cardiovascular: Regular rhythm and normal heart sounds. Exam reveals no gallop.  No murmur heard. Pulses:      Radial pulses are 2+ on the right side, and 2+ on the left side.       Dorsalis pedis pulses are 2+ on the right side, and 2+ on the left side.  Negative for pitting edema. Patient is  actively wearing compression stockings.   Pulmonary/Chest: Effort normal and breath sounds normal. No respiratory distress. She exhibits no tenderness.  Abdominal: Soft. Bowel sounds are normal. She exhibits no distension. There is no tenderness.  Musculoskeletal: Normal range of motion. She exhibits no edema or tenderness.  Neurological: She is alert and oriented to person, place, and time. She has normal strength and normal reflexes. No sensory deficit. She displays a negative Romberg sign.  Skin: Skin is warm, dry and intact. No rash noted. No erythema. No pallor.  Psychiatric: She has a normal mood and affect. Her speech is normal and behavior is normal. Judgment and thought content normal. Cognition and memory are normal.  Nursing note  and vitals reviewed.  Assessment & Plan:   1. HTN (hypertension), benign, controlled - In clinic BP 125/84. Patient is well controlled on medication regimen. Patient c/o headache that occurs intermittently and often in the evening. Last head CT was unremarkable.  - Continue medication regimen. Will continue to monitor headache and associated symptoms.  - Instructed to maintain a low-salt, low-carb, and low-fat diet.  - Encouraged to continue going to [employer]; manage at least 150 min/week exercise regimen for cardiovascular health.  - F/u in 3 months.   2. Other fatigue, uncontrolled - Patient remains symptomatic for increased fatigue with palpitations, intermittent chest pain and sob. ECG performed. No changes from last ECG performed 06/08/15.  - Will continue to monitor. Fatigue may be related to hyperthyroidism. She is expected to receive radiation iodine therapy.  - F/u in 3 months.  - EKG 12-Lead  3. Hyperthyroidism, uncontrolled - Patient is followed by Endocrinology, Dr. Dwyane Dee. She has scheduled radiation iodine therapy for treatment on 06/06/17. - Will continue to monitor. Instructed to continue f/u with Endocrinology as planned.  - F/u in 3  months.  - Ambulatory referral to Ophthalmology  4. Chronic venous insufficiency, controlled  - Patient is followed by vascular surgery, Dr. Donnetta Hutching. She will continue to wear compression stockings as recommended for pedal edema.  - Will continue to monitor. Instructed to continue f/u with Vascular as needed.  - F/u in 3 months.   5. Dyslipidemia, stable - Instructed to maintain a low-salt, low-carb, and low-fat diet.  - Encouraged to continue going to [employer]; manage at least 150 min/week exercise regimen for cardiovascular health.  - F/u in 3 months.   6. Vertigo, uncontrolled - Patient reports recent episode of vertigo. Denies changes in characteristics. Due to her increased fatigue and recent episode, an ECG was performed. ECG has not acute changes from previous. - EKG 12-Lead  7. Healthcare maintenance - Referral to GI for colonoscopy.  - Ambulatory referral to Gastroenterology  8. Blurred vision, uncontrolled - Patient c/o blurred vision and noted exophthalmos of R eye. Reccommended that she see Ophthalmology for further evaluation. Referral made for Ophthalmology.  - Ambulatory referral to Ophthalmology  Follow-up: Return in about 3 months (around 09/01/2017).   Krystle H. Hulan Fray, AGDNP-student   Evaluation and management procedures were performed by me with DNP Student in attendance, note written by DNP student under my supervision and collaboration. I have reviewed the note and I agree with the management and plan.   Angelica Chessman, MD, [place], [place], Karilyn Cota [facility] and [facility] [place], [place]   06/08/2017, 7:41 PM

## 2017-06-06 ENCOUNTER — Other Ambulatory Visit: Payer: Self-pay | Admitting: Internal Medicine

## 2017-06-06 ENCOUNTER — Encounter: Payer: Self-pay | Admitting: Gastroenterology

## 2017-06-06 ENCOUNTER — Encounter (HOSPITAL_COMMUNITY): Payer: Self-pay

## 2017-06-06 ENCOUNTER — Encounter (HOSPITAL_COMMUNITY)
Admission: RE | Admit: 2017-06-06 | Discharge: 2017-06-06 | Disposition: A | Discharge status: Home / Self Care | Payer: 59 | Source: Ambulatory Visit | Attending: Endocrinology | Admitting: Endocrinology

## 2017-06-06 DIAGNOSIS — M545 Low back pain, unspecified: Secondary | ICD-10-CM

## 2017-06-06 DIAGNOSIS — E0520 Thyrotoxicosis with toxic multinodular goiter without thyrotoxic crisis or storm: Secondary | ICD-10-CM | POA: Diagnosis present

## 2017-06-06 DIAGNOSIS — G8929 Other chronic pain: Secondary | ICD-10-CM

## 2017-06-06 DIAGNOSIS — E0590 Thyrotoxicosis, unspecified without thyrotoxic crisis or storm: Secondary | ICD-10-CM | POA: Diagnosis not present

## 2017-06-06 MED ORDER — SODIUM IODIDE I 131 CAPSULE
29.7000 | Freq: Once | INTRAVENOUS | Status: AC
  Administered 2017-06-06: 29.7 via ORAL

## 2017-06-09 ENCOUNTER — Other Ambulatory Visit: Payer: 59

## 2017-06-09 ENCOUNTER — Encounter: Payer: Self-pay | Admitting: Endocrinology

## 2017-06-11 ENCOUNTER — Ambulatory Visit: Payer: 59 | Admitting: Endocrinology

## 2017-06-23 ENCOUNTER — Encounter: Payer: Self-pay | Admitting: Family Medicine

## 2017-06-29 ENCOUNTER — Other Ambulatory Visit: Payer: Self-pay | Admitting: Endocrinology

## 2017-06-29 DIAGNOSIS — E0590 Thyrotoxicosis, unspecified without thyrotoxic crisis or storm: Secondary | ICD-10-CM

## 2017-07-01 ENCOUNTER — Other Ambulatory Visit (INDEPENDENT_AMBULATORY_CARE_PROVIDER_SITE_OTHER): Payer: 59

## 2017-07-01 DIAGNOSIS — E0590 Thyrotoxicosis, unspecified without thyrotoxic crisis or storm: Secondary | ICD-10-CM

## 2017-07-01 LAB — TSH

## 2017-07-01 LAB — T4, FREE: FREE T4: 1.38 ng/dL (ref 0.60–1.60)

## 2017-07-01 LAB — T3, FREE: T3, Free: 4.7 pg/mL — ABNORMAL HIGH (ref 2.3–4.2)

## 2017-07-07 ENCOUNTER — Ambulatory Visit: Payer: 59 | Admitting: Endocrinology

## 2017-07-07 ENCOUNTER — Encounter: Payer: Self-pay | Admitting: Endocrinology

## 2017-07-07 VITALS — BP 130/74 | HR 74 | Ht 70.0 in | Wt 226.0 lb

## 2017-07-07 DIAGNOSIS — E0520 Thyrotoxicosis with toxic multinodular goiter without thyrotoxic crisis or storm: Secondary | ICD-10-CM

## 2017-07-07 NOTE — Progress Notes (Signed)
Patient ID: Stacy Bryan, female   DOB: 07/22/1955, 62 y.o.   MRN: [id number]                                                                                                              Reason for Appointment:  Hyperthyroidism, follow-up  Referring physician:Jegede   Chief complaint: Feeling tired   History of Present Illness:    Background history: About 2 years ago Stacy Bryan had gone to see a physician because of problems with weight gain and fatigue that had been going on for a few months Also Stacy Bryan was having some palpitations at that time but no sweating or heat intolerance or shakiness Not clear if Stacy Bryan had a goiter in the initial exam and no records are available including baseline labs. However Stacy Bryan TSH was low in 09/2011 Stacy Bryan was told to have Hyperthyroidism and started on methimazole 5 mg daily, probably after Stacy Bryan scan in 6/17 However Stacy Bryan was not followed for too long and has been mostly followed by PCP with no change in Stacy Bryan methimazole and only irregular labs and follow-up  RECENT history:  Stacy Bryan has had a toxic nodular goiter, previously on methimazole. Thyrotropin receptor antibody was negative  Stacy Bryan was treated with radioactive iodine, given 29.7 mCi on 06/06/17  Stacy Bryan did not start Stacy Bryan methimazole after the radioactive iodine treatment Although Stacy Bryan has had palpitations on and off in the past Stacy Bryan did have some of the symptoms after the I-131 treatment but not for the last 2 weeks Stacy Bryan still does not think Stacy Bryan fatigue is any better Does not feel as warm as before  Stacy Bryan right eye has been more prominent but Stacy Bryan thinks it is not as evident now    Wt Readings from Last 3 Encounters:  07/07/17 226 lb (102.5 kg)  06/04/17 228 lb 3.2 oz (103.5 kg)  05/27/17 225 lb (102.1 kg)      Thyroid function tests as follows:     TSH on 08/23/15 was 0.096  Lab Results  Component Value Date   FREET4 1.38 07/01/2017   FREET4 0.82 10/08/2013   FREET4 0.84 10/02/2011   T3FREE 4.7 (H)  07/01/2017   T3FREE 3.3 03/12/2017   TSH <0.01 Repeated and verified X2. (L) 07/01/2017   TSH 0.44 03/12/2017   TSH 0.495 10/08/2013    Lab Results  Component Value Date   THYROTRECAB <0.50 03/12/2017    THYROID SCAN done in 7/18 showed uptake of 40.1% and multinodular goiter   Allergies as of 07/07/2017   No Known Allergies     Medication List        Accurate as of 07/07/17 11:24 AM. Always use your most recent med list.          aspirin EC 81 MG tablet Take 81 mg by mouth daily.   calcium carbonate 420 MG Chew chewable tablet Commonly known as:  [place] by mouth.   cyclobenzaprine 5 MG tablet Commonly known as:  FLEXERIL Take 1 tablet (5 mg total) 3 (three) times daily  by mouth.   fish oil-omega-3 fatty acids 1000 MG capsule Take 2 g by mouth daily.   hydrochlorothiazide 12.5 MG capsule Commonly known as:  MICROZIDE Take 1 capsule (12.5 mg total) daily by mouth. Need office visit for more refills   ibuprofen 800 MG tablet Commonly known as:  ADVIL,MOTRIN TAKE 1 TABLET BY MOUTH EVERY 8 HOURS AS NEEDED   magnesium oxide 400 MG tablet Commonly known as:  MAG-OX Take 400 mg by mouth daily.   meclizine 25 MG tablet Commonly known as:  ANTIVERT Take 1 tablet (25 mg total) every 6 (six) hours as needed by mouth for dizziness.   VITAMIN D-3 PO Take 400 Units by mouth daily.   ZINC 15 PO Take 15 mg by mouth daily.           Past Medical History:  Diagnosis Date  . Abnormal Pap smear   . Allergy   . Hyperlipidemia   . Hypertension   . Thyroid disease     Past Surgical History:  Procedure Laterality Date  . ABDOMINAL HYSTERECTOMY  1997   total  . FOOT SURGERY     both feet  20 yrs ago  . OVARY SURGERY  1980's   removal of R ovary (cyst)    Family History  Problem Relation Age of Onset  . Hypertension Mother   . Heart disease Father   . Asthma Father   . Hyperlipidemia Father   . Asthma Sister   . Breast cancer Sister   . Thyroid  disease Sister        Surgery  . Heart disease Sister   . Diabetes Sister   . Gout Sister   . Diabetes Sister   . Ulcers Sister   . Cancer Maternal Grandmother     Social History:  reports that Stacy Bryan quit smoking about 11 years ago. Stacy Bryan has never used smokeless tobacco. Stacy Bryan reports that Stacy Bryan does not drink alcohol or use drugs.  Allergies: No Known Allergies   Review of Systems  Constitutional: Negative for weight gain.  HENT: Negative for headaches.   Eyes: Negative for blurred vision.  Respiratory: Negative for shortness of breath.   Cardiovascular: Positive for palpitations and leg swelling.  Gastrointestinal: Negative for abdominal pain.  Endocrine: Positive for fatigue and heat intolerance.  Musculoskeletal: Negative for joint pain.  Skin: Negative for rash.  Neurological: Negative for weakness.  Psychiatric/Behavioral: Negative for nervousness and insomnia.      Examination:   BP 130/74 (BP Location: Left Arm, Patient Position: Sitting, Cuff Size: Normal)   Pulse 74   Ht 5\' 10"  (1.778 m)   Wt 226 lb (102.5 kg)   SpO2 95%   BMI 32.43 kg/m           Eyes:  Stacy Bryan has a mild prominence of the right eye with mild upper lid retraction No lid lag  Neck: The thyroid is barely palpable even on the left side   Neurological: No  fine tremors are present. Deep tendon reflexes at biceps showed normal relaxation    Assessment/Plan:   Hyperthyroidism,  diagnosed in 2017 as toxic nodular goiter  No evidence of Graves' disease on thyrotropin receptor antibody testing  Stacy Bryan has been treated with radioactive iodine done in February 2019 Although Stacy Bryan had previously been on methimazole Stacy Bryan had not started back on this after the treatment However Stacy Bryan is doing symptomatically somewhat better and no complaints of palpitation No tachycardia on assessment today Also Stacy Bryan goiter is significantly  smaller than before  Most likely Stacy Bryan should become euthyroid soon Discussed that some  of Stacy Bryan high thyroid levels now done last week are related to destruction of thyroid tissue with I-131 However if Stacy Bryan starts having palpitations heat intolerance again Stacy Bryan will need to call Follow-up in 1 month  FATIGUE : Unlikely to be from thyroid dysfunction will follow-up with PCP    Elayne Snare 07/07/2017, 11:24 AM       Note: This office note was prepared with Dragon voice recognition system technology. Any transcriptional errors that result from this process are unintentional.

## 2017-07-11 ENCOUNTER — Other Ambulatory Visit: Payer: Self-pay

## 2017-07-11 ENCOUNTER — Ambulatory Visit (AMBULATORY_SURGERY_CENTER): Payer: Self-pay

## 2017-07-11 VITALS — Ht 70.0 in | Wt 224.0 lb

## 2017-07-11 DIAGNOSIS — Z1211 Encounter for screening for malignant neoplasm of colon: Secondary | ICD-10-CM

## 2017-07-11 MED ORDER — NA SULFATE-K SULFATE-MG SULF 17.5-3.13-1.6 GM/177ML PO SOLN: 1.0000 | Freq: Once | ORAL | 0 refills | Status: AC

## 2017-07-11 NOTE — Progress Notes (Signed)
Denies allergies to eggs or soy products. Denies complication of anesthesia or sedation. Denies use of weight loss medication. Denies use of O2.   Emmi instructions declined.  

## 2017-07-15 ENCOUNTER — Encounter: Payer: Self-pay | Admitting: Gastroenterology

## 2017-07-25 ENCOUNTER — Encounter: Payer: Self-pay | Admitting: Gastroenterology

## 2017-07-25 ENCOUNTER — Other Ambulatory Visit: Payer: Self-pay

## 2017-07-25 ENCOUNTER — Ambulatory Visit (AMBULATORY_SURGERY_CENTER): Payer: 59 | Admitting: Gastroenterology

## 2017-07-25 VITALS — BP 124/73 | HR 60 | Temp 97.5°F | Resp 18 | Ht 70.0 in | Wt 224.0 lb

## 2017-07-25 DIAGNOSIS — D120 Benign neoplasm of cecum: Secondary | ICD-10-CM

## 2017-07-25 DIAGNOSIS — D122 Benign neoplasm of ascending colon: Secondary | ICD-10-CM

## 2017-07-25 DIAGNOSIS — Z1211 Encounter for screening for malignant neoplasm of colon: Secondary | ICD-10-CM

## 2017-07-25 HISTORY — PX: COLONOSCOPY: SHX174

## 2017-07-25 MED ORDER — SODIUM CHLORIDE 0.9 % IV SOLN: 500.0000 mL | Freq: Once | INTRAVENOUS | Status: DC

## 2017-07-25 NOTE — Progress Notes (Signed)
Pt's states no medical or surgical changes since previsit or office visit. 

## 2017-07-25 NOTE — Progress Notes (Signed)
To recovery, report to RN, VSS. 

## 2017-07-25 NOTE — Progress Notes (Signed)
Called to room to assist during endoscopic procedure.  Patient ID and intended procedure confirmed with present staff. Received instructions for my participation in the procedure from the performing physician.  

## 2017-07-25 NOTE — Patient Instructions (Signed)
YOU HAD AN ENDOSCOPIC PROCEDURE TODAY AT THE [facility]:   Refer to the procedure report that was given to you for any specific questions about what was found during the examination.  If the procedure report does not answer your questions, please call your gastroenterologist to clarify.  If you requested that your care partner not be given the details of your procedure findings, then the procedure report has been included in a sealed envelope for you to review at your convenience later.  YOU SHOULD EXPECT: Some feelings of bloating in the abdomen. Passage of more gas than usual.  Walking can help get rid of the air that was put into your GI tract during the procedure and reduce the bloating. If you had a lower endoscopy (such as a colonoscopy or flexible sigmoidoscopy) you may notice spotting of blood in your stool or on the toilet paper. If you underwent a bowel prep for your procedure, you may not have a normal bowel movement for a few days.  Please Note:  You might notice some irritation and congestion in your nose or some drainage.  This is from the oxygen used during your procedure.  There is no need for concern and it should clear up in a day or so.  SYMPTOMS TO REPORT IMMEDIATELY:   Following lower endoscopy (colonoscopy or flexible sigmoidoscopy):  Excessive amounts of blood in the stool  Significant tenderness or worsening of abdominal pains  Swelling of the abdomen that is new, acute  Fever of 100F or higher   For urgent or emergent issues, a gastroenterologist can be reached at any hour by calling [phone].   DIET:  We do recommend a small meal at first, but then you may proceed to your regular diet.  Drink plenty of fluids but you should avoid alcoholic beverages for 24 hours.  ACTIVITY:  You should plan to take it easy for the rest of today and you should NOT DRIVE or use heavy machinery until tomorrow (because of the sedation medicines used during the test).     FOLLOW UP: Our staff will call the number listed on your records the next business day following your procedure to check on you and address any questions or concerns that you may have regarding the information given to you following your procedure. If we do not reach you, we will leave a message.  However, if you are feeling well and you are not experiencing any problems, there is no need to return our call.  We will assume that you have returned to your regular daily activities without incident.  If any biopsies were taken you will be contacted by phone or by letter within the next 1-3 weeks.  Please call us at [phone] if you have not heard about the biopsies in 3 weeks.    SIGNATURES/CONFIDENTIALITY: You and/or your care partner have signed paperwork which will be entered into your electronic medical record.  These signatures attest to the fact that that the information above on your After Visit Summary has been reviewed and is understood.  Full responsibility of the confidentiality of this discharge information lies with you and/or your care-partner.  Polyp information given. 

## 2017-07-25 NOTE — Op Note (Signed)
[facility] Patient Name: Stacy Bryan Procedure Date: 07/25/2017 1:44 PM MRN: [id number] Endoscopist: Remo Lipps P. Neema Fluegge MD, MD Age: 62 Referring MD:  Date of Birth: [birth year] Gender: Female Account #: [id number] Procedure:                Colonoscopy Indications:              Screening for colorectal malignant neoplasm Medicines:                Monitored Anesthesia Care Procedure:                Pre-Anesthesia Assessment:                           - Prior to the procedure, a History and Physical                            was performed, and patient medications and                            allergies were reviewed. The patient's tolerance of                            previous anesthesia was also reviewed. The risks                            and benefits of the procedure and the sedation                            options and risks were discussed with the patient.                            All questions were answered, and informed consent                            was obtained. Prior Anticoagulants: The patient has                            taken no previous anticoagulant or antiplatelet                            agents. ASA Grade Assessment: II - A patient with                            mild systemic disease. After reviewing the risks                            and benefits, the patient was deemed in                            satisfactory condition to undergo the procedure.                           After obtaining informed consent, the colonoscope  was passed under direct vision. Throughout the                            procedure, the patient's blood pressure, pulse, and                            oxygen saturations were monitored continuously. The                            Colonoscope was introduced through the anus and                            advanced to the the cecum, identified by                            appendiceal orifice  and ileocecal valve. The                            colonoscopy was performed without difficulty. The                            patient tolerated the procedure well. The quality                            of the bowel preparation was good. The ileocecal                            valve, appendiceal orifice, and rectum were                            photographed. Scope In: 1:54:13 PM Scope Out: 2:15:10 PM Scope Withdrawal Time: 0 hours 18 minutes 43 seconds  Total Procedure Duration: 0 hours 20 minutes 57 seconds  Findings:                 The perianal and digital rectal examinations were                            normal.                           Two sessile polyps were found in the cecum. The                            polyps were 3 to 6 mm in size. These polyps were                            removed with a cold snare. Resection and retrieval                            were complete.                           A 7 to 8 mm polyp was found in the ascending colon.  The polyp was sessile. The polyp was removed with a                            cold snare. Resection and retrieval were complete.                           The exam was otherwise without abnormality on                            direct and retroflexion views. Complications:            No immediate complications. Estimated blood loss:                            Minimal. Estimated Blood Loss:     Estimated blood loss was minimal. Impression:               - Two 3 to 6 mm polyps in the cecum, removed with a                            cold snare. Resected and retrieved.                           - One 7 to 8 mm polyp in the ascending colon,                            removed with a cold snare. Resected and retrieved.                           - The examination was otherwise normal on direct                            and retroflexion views. Recommendation:           - Patient has a contact number  available for                            emergencies. The signs and symptoms of potential                            delayed complications were discussed with the                            patient. Return to normal activities tomorrow.                            Written discharge instructions were provided to the                            patient.                           - Resume previous diet.                           -  Continue present medications.                           - Await pathology results.                           - Repeat colonoscopy is recommended for                            surveillance. The colonoscopy date will be                            determined after pathology results from today's                            exam become available for review. Remo Lipps P. Joliana Claflin MD, MD 07/25/2017 2:19:37 PM This report has been signed electronically.

## 2017-07-28 ENCOUNTER — Telehealth: Payer: Self-pay | Admitting: *Deleted

## 2017-07-28 NOTE — Telephone Encounter (Signed)
  Follow up Call-  Call back number 07/25/2017  Post procedure Call Back phone  # [phone]  Permission to leave phone message Yes  Some recent data might be hidden     Patient answered phone and stated "I would have been better if you hadn't called to irritate me." One and only phone call.

## 2017-08-04 ENCOUNTER — Other Ambulatory Visit (INDEPENDENT_AMBULATORY_CARE_PROVIDER_SITE_OTHER): Payer: 59

## 2017-08-04 DIAGNOSIS — E0520 Thyrotoxicosis with toxic multinodular goiter without thyrotoxic crisis or storm: Secondary | ICD-10-CM

## 2017-08-04 LAB — T3, FREE: T3 FREE: 3 pg/mL (ref 2.3–4.2)

## 2017-08-04 LAB — T4, FREE: FREE T4: 0.56 ng/dL — AB (ref 0.60–1.60)

## 2017-08-04 LAB — TSH: TSH: 1.8 u[IU]/mL (ref 0.35–4.50)

## 2017-08-05 ENCOUNTER — Encounter: Payer: Self-pay | Admitting: Gastroenterology

## 2017-08-07 ENCOUNTER — Encounter: Payer: Self-pay | Admitting: Endocrinology

## 2017-08-07 ENCOUNTER — Ambulatory Visit (INDEPENDENT_AMBULATORY_CARE_PROVIDER_SITE_OTHER): Payer: 59 | Admitting: Endocrinology

## 2017-08-07 VITALS — BP 138/86 | HR 83 | Ht 70.0 in | Wt 226.4 lb

## 2017-08-07 DIAGNOSIS — E042 Nontoxic multinodular goiter: Secondary | ICD-10-CM

## 2017-08-07 MED ORDER — LEVOTHYROXINE SODIUM 50 MCG PO TABS: 50.0000 ug | ORAL_TABLET | Freq: Every day | ORAL | 3 refills | Status: DC

## 2017-08-07 NOTE — Progress Notes (Signed)
Patient ID: Stacy Bryan, female   DOB: 1955-07-27, 62 y.o.   MRN: [id number]                                                                                                              Reason for Appointment: Thyroid, follow-up  Referring physician:Jegede   Chief complaint: Feeling tired   History of Present Illness:    Background history: About 2 years ago she had gone to see a physician because of problems with weight gain and fatigue that had been going on for a few months Also she was having some palpitations at that time but no sweating or heat intolerance or shakiness Not clear if she had a goiter in the initial exam and no records are available including baseline labs. However her TSH was low in 09/2011 She was told to have Hyperthyroidism and started on methimazole 5 mg daily, probably after her scan in 6/17 However she was not followed for too long and has been mostly followed by PCP with no change in her methimazole and only irregular labs and follow-up  RECENT history:  She has had a toxic nodular goiter, previously on methimazole. Thyrotropin receptor antibody was negative  She was treated with radioactive iodine, given 29.7 mCi on 06/06/17 Subsequently methimazole was stopped.  She does not complain of any palpitations any further She may be a little warm at times but not as much as before Has continued to have some fatigue and not clear if this is slightly worse recently also   No recent skin changes or hair loss Has minimal weight gain  Her right eye has been more prominent and she does not think this has improved    Wt Readings from Last 3 Encounters:  08/07/17 226 lb 6.4 oz (102.7 kg)  07/25/17 224 lb (101.6 kg)  07/11/17 224 lb (101.6 kg)      Thyroid function tests as follows:      Lab Results  Component Value Date   FREET4 0.56 (L) 08/04/2017   FREET4 1.38 07/01/2017   FREET4 0.82 10/08/2013   T3FREE 3.0 08/04/2017   T3FREE 4.7 (H)  07/01/2017   T3FREE 3.3 03/12/2017   TSH 1.80 08/04/2017   TSH <0.01 Repeated and verified X2. (L) 07/01/2017   TSH 0.44 03/12/2017  TSH on 08/23/15 was 0.096  Lab Results  Component Value Date   THYROTRECAB <0.50 03/12/2017    THYROID SCAN done in 7/18 showed uptake of 40.1% and multinodular goiter   Allergies as of 08/07/2017   No Known Allergies     Medication List        Accurate as of 08/07/17 11:27 AM. Always use your most recent med list.          ALIVE ENERGY 50+ PO Take by mouth every morning.   aspirin EC 81 MG tablet Take 81 mg by mouth daily.   calcium carbonate 420 MG Chew chewable tablet Commonly known as:  [place] by mouth.   cyclobenzaprine 5 MG tablet Commonly known  as:  FLEXERIL Take 1 tablet (5 mg total) 3 (three) times daily by mouth.   fish oil-omega-3 fatty acids 1000 MG capsule Take 2 g by mouth daily.   hydrochlorothiazide 12.5 MG capsule Commonly known as:  MICROZIDE Take 1 capsule (12.5 mg total) daily by mouth. Need office visit for more refills   ibuprofen 800 MG tablet Commonly known as:  ADVIL,MOTRIN TAKE 1 TABLET BY MOUTH EVERY 8 HOURS AS NEEDED   magnesium oxide 400 MG tablet Commonly known as:  MAG-OX Take 400 mg by mouth daily.   meclizine 25 MG tablet Commonly known as:  ANTIVERT Take 1 tablet (25 mg total) every 6 (six) hours as needed by mouth for dizziness.   OVER THE COUNTER MEDICATION Fish oil 1000 mg one tablet daily.   OVER THE COUNTER MEDICATION Chlor Tab, two tablets daily for allergies.   VITAMIN D-3 PO Take 400 Units by mouth daily.   ZINC 15 PO Take 15 mg by mouth daily.           Past Medical History:  Diagnosis Date  . Abnormal Pap smear   . Allergy   . Anemia   . GERD (gastroesophageal reflux disease)   . Hyperlipidemia   . Hypertension   . Thyroid disease     Past Surgical History:  Procedure Laterality Date  . ABDOMINAL HYSTERECTOMY  1997   total  . FOOT SURGERY     both  feet  20 yrs ago  . OVARY SURGERY  1980's   removal of R ovary (cyst)    Family History  Problem Relation Age of Onset  . Hypertension Mother   . Heart disease Father   . Asthma Father   . Hyperlipidemia Father   . Asthma Sister   . Breast cancer Sister   . Stomach cancer Sister   . Thyroid disease Sister        Surgery  . Heart disease Sister   . Diabetes Sister   . Gout Sister   . Diabetes Sister   . Ulcers Sister   . Cancer Maternal Grandmother   . Colon cancer Neg Hx   . Esophageal cancer Neg Hx   . Liver cancer Neg Hx   . Pancreatic cancer Neg Hx   . Rectal cancer Neg Hx     Social History:  reports that she quit smoking about 11 years ago. She has never used smokeless tobacco. She reports that she does not drink alcohol or use drugs.  Allergies: No Known Allergies   Review of Systems    Examination:   BP 138/86 (BP Location: Left Arm, Patient Position: Sitting, Cuff Size: Normal)   Pulse 83   Ht 5\' 10"  (1.778 m)   Wt 226 lb 6.4 oz (102.7 kg)   SpO2 94%   BMI 32.49 kg/m           Eyes:  she has a mild prominence of the right eye with mild upper lid retraction   Neck: The thyroid is not palpable on either side   Neurological: No tremors are present. Deep tendon reflexes at biceps exhibit normal relaxation    Assessment/Plan:   Hyperthyroidism,  diagnosed in 2017 as toxic nodular goiter and treated with I-131  She has been treated with radioactive iodine done in February 2019  Currently she feels fairly good subjectively although has had some persistent fatigue and not sure if this is any worse No other symptoms suggestive of hypothyroidism  However her free T4  is now mildly decreased at 0.59 and her TSH is back to normal  Discussed with the patient that either she has hypothyroidism following I-131 treatment which is transient or may be long-term also but this is difficult to decide at this time Recently has not been on any methimazole  She  will be given a trial of levothyroxine 50 mcg and this may help her fatigue We will also need follow-up in 6 weeks to see if she has normalized her thyroid levels; otherwise if her thyroid levels go up significantly may need to stop this  She will continue to follow-up with her PCP for other problems  Elayne Snare 08/07/2017, 11:27 AM       Note: This office note was prepared with Dragon voice recognition system technology. Any transcriptional errors that result from this process are unintentional.

## 2017-08-18 ENCOUNTER — Encounter: Payer: Self-pay | Admitting: Endocrinology

## 2017-08-21 ENCOUNTER — Ambulatory Visit (INDEPENDENT_AMBULATORY_CARE_PROVIDER_SITE_OTHER): Payer: 59 | Admitting: Podiatry

## 2017-08-21 ENCOUNTER — Encounter: Payer: Self-pay | Admitting: Podiatry

## 2017-08-21 DIAGNOSIS — M779 Enthesopathy, unspecified: Secondary | ICD-10-CM

## 2017-08-21 DIAGNOSIS — I872 Venous insufficiency (chronic) (peripheral): Secondary | ICD-10-CM | POA: Diagnosis not present

## 2017-08-21 DIAGNOSIS — M7750 Other enthesopathy of unspecified foot: Secondary | ICD-10-CM

## 2017-08-21 NOTE — Progress Notes (Signed)
She presents today for follow-up of her swelling in her legs.  States that the swelling really does not even go down at night anymore.  States that hurts right here she refers to the sinus tarsi bilaterally.  We did send her for vascular evaluation and she would like to report on that today.  Objective: Vital signs are stable she is alert and oriented x3.  Pulses are palpable.  There is moderate edema pitting in nature with brawny type skin to the bilateral lower extremity.  She has severe pain on palpation of the sinus tarsi with pain on end range of motion of the subtalar joint.  This is noted to be bilaterally.  Vascular evaluation does demonstrate on the left lower extremity a insufficient or a deep femoral vein that does reflux as well as a superficial saphenous and greater saphenous vein that also refluxes.  But this would not obviously cause bilateral edema.  This is brawny and appears to be more lymphedema.  Assessment: Lymphedema and sinus tarsitis bilateral.  Plan: After sterile Betadine skin prep I injected the sinus tarsitis and subtalar joint bilaterally today with 20 mg Kenalog 5 mg Marcaine to each foot.  I am also going to see by getting her into a lymphedema clinic for treatment.

## 2017-08-22 ENCOUNTER — Telehealth: Payer: Self-pay | Admitting: *Deleted

## 2017-08-22 DIAGNOSIS — I890 Lymphedema, not elsewhere classified: Secondary | ICD-10-CM

## 2017-08-22 NOTE — Telephone Encounter (Signed)
Faxed referral form, clinicals and demographics to [facility].

## 2017-09-02 ENCOUNTER — Ambulatory Visit: Payer: 59 | Admitting: Family Medicine

## 2017-09-25 ENCOUNTER — Other Ambulatory Visit (INDEPENDENT_AMBULATORY_CARE_PROVIDER_SITE_OTHER): Payer: 59

## 2017-09-25 DIAGNOSIS — E042 Nontoxic multinodular goiter: Secondary | ICD-10-CM

## 2017-09-25 LAB — T4, FREE: FREE T4: 0.77 ng/dL (ref 0.60–1.60)

## 2017-09-25 LAB — TSH: TSH: 8.79 u[IU]/mL — ABNORMAL HIGH (ref 0.35–4.50)

## 2017-09-29 ENCOUNTER — Encounter: Payer: Self-pay | Admitting: Endocrinology

## 2017-09-29 ENCOUNTER — Ambulatory Visit: Payer: 59 | Admitting: Endocrinology

## 2017-09-29 VITALS — BP 140/88 | HR 72 | Ht 70.0 in | Wt 222.8 lb

## 2017-09-29 DIAGNOSIS — E890 Postprocedural hypothyroidism: Secondary | ICD-10-CM | POA: Diagnosis not present

## 2017-09-29 DIAGNOSIS — E0500 Thyrotoxicosis with diffuse goiter without thyrotoxic crisis or storm: Secondary | ICD-10-CM

## 2017-09-29 MED ORDER — LEVOTHYROXINE SODIUM 75 MCG PO TABS: 75.0000 ug | ORAL_TABLET | Freq: Every day | ORAL | 3 refills | Status: DC

## 2017-09-29 NOTE — Patient Instructions (Signed)
Take 1 1/2 of 50 dose

## 2017-09-29 NOTE — Progress Notes (Signed)
Patient ID: Stacy Bryan, female   DOB: March 29, 1956, 62 y.o.   MRN: [id number]                                                                                                              Reason for Appointment: Thyroid, follow-up  Referring physician:Jegede   Chief complaint: Feeling tired   History of Present Illness:    Background history: About 2 years ago she had gone to see a physician because of problems with weight gain and fatigue that had been going on for a few months Also she was having some palpitations at that time but no sweating or heat intolerance or shakiness Not clear if she had a goiter in the initial exam and no records are available including baseline labs. However her TSH was low in 09/2011 She was told to have Hyperthyroidism and started on methimazole 5 mg daily, probably after her scan in 6/17 However she was not followed for too long and has been mostly followed by PCP with no change in her methimazole and only irregular labs and follow-up  RECENT history:  She had a toxic nodular goiter diagnosed on initial evaluation. Thyrotropin receptor antibody was negative  She was treated with radioactive iodine, given 29.7 mCi on 06/06/17 Subsequently she appears to have had mild HYPOTHYROIDISM  She was having fatigue and low free T4 level she has started taking 50 mcg of levothyroxine She is still complains of feeling tired at times although may be slightly better She said that she is trying to push herself to do her activities but in the evening if she is sitting down she falls asleep Her weight is down slightly No cold intolerance, tends to feel warm No hair loss  Although her free T4 is improved with her levothyroxine 50 mcg dose her TSH is not high at 8.8  ?  Ophthalmopathy: Her right eye has been more prominent and she does not think this has changed, she has seen an unknown ophthalmologist and was not given any specific guidance    Wt Readings  from Last 3 Encounters:  09/29/17 222 lb 12.8 oz (101.1 kg)  08/07/17 226 lb 6.4 oz (102.7 kg)  07/25/17 224 lb (101.6 kg)     Thyroid function tests as follows:      Lab Results  Component Value Date   FREET4 0.77 09/25/2017   FREET4 0.56 (L) 08/04/2017   FREET4 1.38 07/01/2017   T3FREE 3.0 08/04/2017   T3FREE 4.7 (H) 07/01/2017   T3FREE 3.3 03/12/2017   TSH 8.79 (H) 09/25/2017   TSH 1.80 08/04/2017   TSH <0.01 Repeated and verified X2. (L) 07/01/2017  TSH on 08/23/15 was 0.096  Lab Results  Component Value Date   THYROTRECAB <0.50 03/12/2017    THYROID SCAN done in 7/18 showed uptake of 40.1% and multinodular goiter   Allergies as of 09/29/2017   No Known Allergies     Medication List        Accurate as of 09/29/17  8:54 AM. Always  use your most recent med list.          ALIVE ENERGY 50+ PO Take by mouth every morning.   aspirin EC 81 MG tablet Take 81 mg by mouth daily.   calcium carbonate 420 MG Chew chewable tablet Commonly known as:  [place] by mouth.   cyclobenzaprine 5 MG tablet Commonly known as:  FLEXERIL Take 1 tablet (5 mg total) 3 (three) times daily by mouth.   fish oil-omega-3 fatty acids 1000 MG capsule Take 2 g by mouth daily.   hydrochlorothiazide 12.5 MG capsule Commonly known as:  MICROZIDE Take 1 capsule (12.5 mg total) daily by mouth. Need office visit for more refills   ibuprofen 800 MG tablet Commonly known as:  ADVIL,MOTRIN TAKE 1 TABLET BY MOUTH EVERY 8 HOURS AS NEEDED   levothyroxine 50 MCG tablet Commonly known as:  SYNTHROID, LEVOTHROID Take 1 tablet (50 mcg total) by mouth daily.   magnesium oxide 400 MG tablet Commonly known as:  MAG-OX Take 400 mg by mouth daily.   meclizine 25 MG tablet Commonly known as:  ANTIVERT Take 1 tablet (25 mg total) every 6 (six) hours as needed by mouth for dizziness.   OVER THE COUNTER MEDICATION Fish oil 1000 mg one tablet daily.   OVER THE COUNTER MEDICATION Chlor Tab,  two tablets daily for allergies.   VITAMIN D-3 PO Take 400 Units by mouth daily.   ZINC 15 PO Take 15 mg by mouth daily.           Past Medical History:  Diagnosis Date  . Abnormal Pap smear   . Allergy   . Anemia   . GERD (gastroesophageal reflux disease)   . Hyperlipidemia   . Hypertension   . Thyroid disease     Past Surgical History:  Procedure Laterality Date  . ABDOMINAL HYSTERECTOMY  1997   total  . FOOT SURGERY     both feet  20 yrs ago  . OVARY SURGERY  1980's   removal of R ovary (cyst)    Family History  Problem Relation Age of Onset  . Hypertension Mother   . Heart disease Father   . Asthma Father   . Hyperlipidemia Father   . Asthma Sister   . Breast cancer Sister   . Stomach cancer Sister   . Thyroid disease Sister        Surgery  . Heart disease Sister   . Diabetes Sister   . Gout Sister   . Diabetes Sister   . Ulcers Sister   . Cancer Maternal Grandmother   . Colon cancer Neg Hx   . Esophageal cancer Neg Hx   . Liver cancer Neg Hx   . Pancreatic cancer Neg Hx   . Rectal cancer Neg Hx     Social History:  reports that she quit smoking about 11 years ago. She has never used smokeless tobacco. She reports that she does not drink alcohol or use drugs.  Allergies: No Known Allergies   Review of Systems    Examination:   BP 140/88 (BP Location: Left Arm, Patient Position: Sitting, Cuff Size: Normal)   Pulse 72   Ht 5\' 10"  (1.778 m)   Wt 222 lb 12.8 oz (101.1 kg)   SpO2 96%   BMI 31.97 kg/m           Eyes:  she has a mild prominence of the right eye with mild upper lid retraction Exopthalmometry shows a minimally increased proptosis  measuring 22 mm compared to 20 on the left  Thyroid is barely palpable on the right  The reflexes at biceps exhibit normal relaxation    Assessment/Plan:   Hyperthyroidism,  diagnosed in 2017 as toxic nodular goiter and treated with I-131in February 2019  She has developed posttreatment  HYPOTHYROIDISM  She has persistently low thyroid levels and now even with 50 mcg of levothyroxine she is complaining of some fatigue Also TSH is high at 8.8 despite her taking her levothyroxine regularly  Discussed that she likely will need to be on long-term thyroxine supplement Will increase her dose to 75 mcg now and follow-up in 2 months for reassessment  ?  Ophthalmopathy: Right eye proptosis is unchanged and not explained by her negative thyrotropin receptor antibody in the past, will recheck on the next visit  Elayne Snare 09/29/2017, 8:54 AM       Note: This office note was prepared with Dragon voice recognition system technology. Any transcriptional errors that result from this process are unintentional.

## 2017-10-29 ENCOUNTER — Ambulatory Visit: Payer: 59 | Admitting: Family Medicine

## 2017-11-12 ENCOUNTER — Encounter: Payer: Self-pay | Admitting: Podiatry

## 2017-11-12 DIAGNOSIS — I890 Lymphedema, not elsewhere classified: Secondary | ICD-10-CM

## 2017-11-12 NOTE — Telephone Encounter (Signed)
Left message informing pt we referred most pts to [facility] for treatment of edema, and would send referral to them. Faxed referral to [facility].

## 2017-11-25 ENCOUNTER — Ambulatory Visit: Payer: 59 | Attending: Podiatry | Admitting: Occupational Therapy

## 2017-11-25 ENCOUNTER — Other Ambulatory Visit: Payer: Self-pay

## 2017-11-25 ENCOUNTER — Encounter: Payer: Self-pay | Admitting: Occupational Therapy

## 2017-11-25 DIAGNOSIS — I890 Lymphedema, not elsewhere classified: Secondary | ICD-10-CM | POA: Diagnosis present

## 2017-11-25 NOTE — Patient Instructions (Signed)

## 2017-11-28 ENCOUNTER — Other Ambulatory Visit (INDEPENDENT_AMBULATORY_CARE_PROVIDER_SITE_OTHER): Payer: 59

## 2017-11-28 DIAGNOSIS — E0500 Thyrotoxicosis with diffuse goiter without thyrotoxic crisis or storm: Secondary | ICD-10-CM | POA: Diagnosis not present

## 2017-11-28 DIAGNOSIS — E890 Postprocedural hypothyroidism: Secondary | ICD-10-CM

## 2017-11-28 LAB — TSH: TSH: 6.72 u[IU]/mL — ABNORMAL HIGH (ref 0.35–4.50)

## 2017-11-28 LAB — T4, FREE: Free T4: 0.76 ng/dL (ref 0.60–1.60)

## 2017-11-28 NOTE — Therapy (Addendum)
[place] MAIN [facility] SERVICES [address] [place], [place], [address] Phone: [phone]   Fax:  [phone]  Occupational Therapy Evaluation  Patient Details  Name: Stacy Bryan MRN: [id number] Date of Birth: 1955/05/26 No data recorded  Encounter Date: 11/25/2017    Past Medical History:  Diagnosis Date  . Abnormal Pap smear   . Allergy   . Anemia   . GERD (gastroesophageal reflux disease)   . Hyperlipidemia   . Hypertension   . Thyroid disease     Past Surgical History:  Procedure Laterality Date  . ABDOMINAL HYSTERECTOMY  1997   total  . FOOT SURGERY     both feet  20 yrs ago  . OVARY SURGERY  1980's   removal of R ovary (cyst)    There were no vitals filed for this visit.       [facility] OT Assessment - 12/02/17 0001      Assessment   Medical Diagnosis  Mild, stage II, BLE lymphedema , L>R 2/2 CVI    Onset Date/Surgical Date  11/13/16    Hand Dominance  Right    Prior Therapy  ccl  (20-30 mmHg)1 thogh high stockins      Precautions   Precautions  Fall      Balance Screen   Has the patient fallen in the past 6 months  Yes    How many times?  --   1 reported   Has the patient had a decrease in activity level because of a fear of falling?   No    Is the patient reluctant to leave their home because of a fear of falling?   No      Home  Environment   Family/patient expects to be discharged to:  Private residence    Living Arrangements  Alone    Type of [place]      Prior Function   Level of Independence  Independent;Independent with basic ADLs;Independent with household mobility without device;Independent with community mobility without device;Independent with homemaking with ambulation;Independent with gait;Independent with transfers    Vocation  Full time employment    Ambulance person- extended walking, sitting, driving    Leisure  family       IADL   Prior Level of Function Shopping  I    Shopping  --   limited to 1 hr by  increased foot/ leg pain and swelling   Prior Level of Function Light Housekeeping  I    Light Housekeeping  --   limited to 1 hr by  increased foot/ leg pain and swelling   Prior Level of Function Meal Prep  I    Meal Prep  --   limited to 1 hr by  increased foot/ leg pain and swelling   Prior Level of Function Community Mobility  I    Investment banker, corporate own vehicle   limited to 1 hr by  increased foot/ leg pain and swelling     Mobility   Mobility Status  History of falls      Activity Tolerance   Activity Tolerance  Endurance does not limit participation in activity    Activity Tolerance Comments  Activity tolerance limited by BLE swelling and associated pain > 1 hr      Cognition   Overall Cognitive Status  Within Functional Limits for tasks assessed      Posture/Postural  Control   Posture/Postural Control  No significant limitations      Sensation   Light Touch  Appears Intact    Additional Comments  Pt reports intermittent numbness and tingling in distal legs and feet      Coordination   Gross Motor Movements are Fluid and Coordinated  Yes    Fine Motor Movements are Fluid and Coordinated  Yes      AROM   Overall AROM   Within functional limits for tasks performed;Other (comment)   Pain at end ranged of hip and knee flexion;      Strength   Overall Strength  Within functional limits for tasks performed      Hand Function   Right Hand Gross Grasp  Functional    Right Hand Grip (lbs)  WFL    Left Hand Gross Grasp  Functional    Left Hand Grip (lbs)  WFL      Mild, Stage II , BLE lymphedema 2/2 CVI Skin Description Hyper-Keratosis Peau' de Orange Shiny Tight Fibrotic Fatty Doughy Indurated       Slight at malleoli, L>R  x     Hydration Dry Flaky Erythema Other   x    feet   Color Redness Present Pallor Blanching Hemosiderin Staining Other      Trace L>R     Odor Malodorous Yeast  Absent      x   Temperature Warm Cool Normal    x    Pitting Edema   1+ 2+ 3+ 4+ Non-pitting   x        Girth Symmetrical Asymmetrical Other Distribution    L>R > congestion below knees   Stemmer Sign Positive Negative     +     Lymphorrea History Of:  Present Absent   denies  x    Wounds History Of Present Absent Venous Arterial Pressure Size   denies            Signs of Infection Redness Warmth Erythema Acute Swelling Drainage Borders                   Scars Adhesions Hypersensitivity        Sensation Light Touch Deep pressure Hypersensitivty   Present Impaired Present Impaired Present Impaired    x  x      x  Nails WNL Fungus Present Other   x     Hair Growth Symmetrical Asymmetrical   unremarkable    Skin Creases Base of toes Ankle Base of Finger Medial Thigh         absent              Plan - 12/02/17 0833    Clinical Impression Statement  Stacy Bryan is a 62 y o female presenting with mild, stage II, BLE Lymphedema, L>R, secondary to chronic venous insufficiency. Onset was ~ 1 year ago without known precipitating event. Swelling is concentrated primarily below the knees and extends distally to involve dorsal feet and toes. Additional factors contributing to leg swelling include HTN, obesity, extended dependent positioning, and chronic inflammation due to osteoarthritis. Leg swelling and associated pain/ discomfort started ~ 1 year ago by without known precipitating event, and it has worsened over time. Cortisone injections temporarily reduce leg swelling and associated pain; however, swelling and pain have worsened over time and are more challenging to control.   Chronic, progressive BLE LE limits functional ambulation and transfers, impairs ability to perform basic and instrumental ADLs, limits  productive activities and participation in social activities and leisure pursuits. Skilled Occupational Therapy for intensive and  self-management phases of Complete Decongestive Therapy (CDT) are medically necessary to reduce and control limb swelling to limit progression, and to improve functional independence and safety. Without skilled OT for CDT chronic, progressive LE will worsen and further functional decline is expected.    Occupational performance deficits (Please refer to evaluation for details):  ADL's;IADL's;Rest and Sleep;Work;Leisure;Social Participation;Other   Body Image   Rehab Potential  Good    OT Frequency  2x / week    OT Duration  12 weeks    OT Treatment/Interventions  Self-care/ADL training;Therapeutic exercise;Manual Therapy;Manual lymph drainage;DME and/or AE instruction;Compression bandaging;Other (comment);Patient/family education   skin care w/ low ph lotion and castor oil to improve hydration and flexibility\   Clinical Decision Making  Limited treatment options, no task modification necessary    OT Home Exercise Plan  lymphatic pumping ther ex    Recommended Other Services  fit qw/ ccl 2, knee length, OTS compression stockings. Consider Juzo 2002    Consulted and Agree with Plan of Care  Patient       OT Long Term Goals - 12/02/17 0835      OT LONG TERM GOAL #1   Title  Pt modified independent w/ lymphedema precautions/prevention principals and using printed reference to limit LE progression and infection risk.    Baseline  Max A    Time  2    Period  Weeks    Status  New      OT LONG TERM GOAL #2   Title  Lymphedema (LE) management/ self-care:  Pt to tolerate daily compression garments and/ or HOS devices in keeping w/ prescribed wear regime within 1 week of issue date to restore normal limb volume and shape, to reduce tissue congestion and infection risk, and to limit LE progression.    Baseline  Max A    Time  4    Period  Weeks    Status  New      OT LONG TERM GOAL #3   Title  Lymphedema (LE) management/ self-care:  Pt to achieve at least 5%  limb volume reduction below knee in  LLE during modified Intensive Phase CDT to reduce limb volume, to reduce  infection risk and to limit LE progression     Baseline  Max A    Time  12    Period  Weeks    Status  New      OT LONG TERM GOAL #4   Title  Lymphedema management/ Self-care: Pt able to don and doff compression garments and/ or devices using assistive devices and extra times PRN in order to sustain clinical gains in liumb volume reduction and to ensure optimal lymphedema management over time.    Baseline  Max A    Time  12    Period  Weeks    Status  New      OT LONG TERM GOAL #5   Title  Lymphedema (LE) Pain: Pt to report 25% reduction in BLE pain/discomfort described as  "heaviness,  fullness, tightness"" by DC to improve standing and walking tolerance and to improve functional performance in all occupational domains.    Baseline  Max A    Time  12    Period  Weeks    Status  New      OT LONG TERM GOAL #6   Title  Lymphedema (LE) management/ self-care:  During Management  Phase CDT Pt to sustain reduced limb volumes achieved during Intensive Phase CDT at follow up visits during Management Phase CDT within 3% using LE self-care home program components as directed ( simple self MLD, skin care, ther ex and daily/ nightly compression garments/ devices) .    Baseline  Max A    Time  6    Period  Months    Status  New                           Patient will benefit from skilled therapeutic intervention in order to improve the following deficits and impairments:     Visit Diagnosis: Lymphedema, not elsewhere classified    Problem List Patient Active Problem List   Diagnosis Date Noted  . Hypothyroidism, postablative 09/29/2017  . Hyperthyroidism 02/26/2017  . Vertigo 02/26/2017  . Chronic venous insufficiency 01/10/2017  . Varicose veins of both lower extremities with complications 95/62/1308  . Preventative health care 07/04/2014  . Chronic maxillary sinusitis 07/04/2014  . Midline  low back pain without sciatica 10/08/2013  . Encounter to establish care 10/08/2013  . Other specified hypothyroidism 10/08/2013  . Breast cancer screening 10/08/2013  . Colon cancer screening 10/08/2013  . Subclinical hypothyroidism 10/02/2011  . HTN (hypertension), benign 06/01/2011  . Dyslipidemia 06/01/2011  . GERD (gastroesophageal reflux disease) 06/01/2011  . History of seasonal allergies 06/01/2011    Andrey Spearman, MS, OTR/L, [facility] 11/28/17 3:57 PM  [place] MAIN [facility] SERVICES [address] [place], [place], [address] Phone: [phone]   Fax:  [phone]  Name: Stacy Bryan MRN: [id number] Date of Birth: Jan 24, 1956

## 2017-11-29 LAB — THYROTROPIN RECEPTOR AUTOABS: Thyrotropin Receptor Ab: <1.1 IU/L (ref 0.00–1.75)

## 2017-12-01 ENCOUNTER — Ambulatory Visit: Payer: 59 | Admitting: Endocrinology

## 2017-12-02 ENCOUNTER — Ambulatory Visit: Payer: 59 | Admitting: Occupational Therapy

## 2017-12-02 DIAGNOSIS — I890 Lymphedema, not elsewhere classified: Secondary | ICD-10-CM | POA: Diagnosis not present

## 2017-12-02 NOTE — Therapy (Signed)
[place] MAIN [facility] SERVICES [address] [place], [place], [address] Phone: [phone]   Fax:  [phone]  Occupational Therapy Treatment  Patient Details  Name: Stacy Bryan MRN: [id number] Date of Birth: 10-10-1955 Referring Provider: Tyson Dense, DPM   Encounter Date: 12/02/2017  OT End of Session - 12/02/17 0200    Visit Number  1    Number of Visits  36    Date for OT Re-Evaluation  02/23/18    OT Start Time  0906    OT Stop Time  1006    OT Time Calculation (min)  60 min    Activity Tolerance  Patient tolerated treatment well    Behavior During Therapy  [facility] for tasks assessed/performed       Past Medical History:  Diagnosis Date  . Abnormal Pap smear   . Allergy   . Anemia   . GERD (gastroesophageal reflux disease)   . Hyperlipidemia   . Hypertension   . Thyroid disease     Past Surgical History:  Procedure Laterality Date  . ABDOMINAL HYSTERECTOMY  1997   total  . FOOT SURGERY     both feet  20 yrs ago  . OVARY SURGERY  1980's   removal of R ovary (cyst)    There were no vitals filed for this visit.  Subjective Assessment - 12/02/17 1633    Subjective   Pt presents for OT treatment visit 2                           / 36 to address BLE lymphedema. Pt completed Lymphedema Life Impact Scale, and is agreeable to baseline volumetric measurements to determine limb volume.    Pertinent History  S/P sudden onset chronic, progressive , BLE feet and leg swelling 1 yr without known precipitating event; + family hac of leg swelling , mother and sisters; HTN, Hypothyroidism, Hx abdominal histerectomy 1997; "Foot sx" ?; Venous duplex reveals LLE reflux in deep femoral vein and in superficial and greater saphenous veinss; former smiker.    Limitations  impaired standing and walking tolerance ( 1 hr) decreased hip and knee AROM- difficulty bending to reach feet for bathing and dressing; , difficulty fitting  preferred street  shoes, chronic pain in ankle, knees, hips and back,     Patient Stated Goals  reduce leg swelling and associated pain and keep it under control    Currently in Pain?  Yes    Pain Score  4     Pain Location  Leg    Pain Orientation  Right    Pain Descriptors / Indicators  Heaviness;Sharp;Sore;Numbness;Tender;Pins and needles;Tightness;Tiring    Pain Type  Chronic pain    Pain Onset  Other (comment)   1 yr   onset w/o known precipitating event         LYMPHEDEMA/ONCOLOGY QUESTIONNAIRE - 12/02/17 1643      Lymphedema Assessments   Lymphedema Assessments  Lower extremities      Right Lower Extremity Lymphedema   Other  RLE limb volume from ankle to tiibial tuberosity = = 3881.49 ml.    Other  Limb volume differential measures 0,6%, L>R      Left Lower Extremity Lymphedema   Other  LLE limb volume from ankle to tiibial tuberosity = 3907.23 ml.              OT Treatments/Exercises (OP) - 12/02/17 1639  Transfers   Transfers  Sit to Stand;Stand to Sit    Sit to Stand  7: Independent    Stand to Sit  7: Independent      ADLs   ADL Education Given  Yes      Manual Therapy   Manual Therapy  Edema management;Manual Lymphatic Drainage (MLD);Compression Bandaging    Manual therapy comments  completed BLE beloe-the -knee comparative limb volumetrics - baseline    Edema Management  skin care to RLE with llow ph castor ooil during MLD to hydrate skin and increase tissue mobility     Manual Lymphatic Drainage (MLD)  MLD to RLE utilizing short neck sequence , deep abdominal pathways, and functional inguina LN in supine.    Compression Bandaging  no compression wrapping today. will fit with ccl 2 , knee length, compression  stockings ASAP instead of utilizing wraps  in this case as swelling is so mild and acute with minimal fibrosis.             OT Education - 12/02/17 1651    Education Details  Continued skilled Pt/caregiver education  And LE ADL training throughout  visit for lymphedema self care/ home program, including compression wrapping, compression garment and device wear/care, lymphatic pumping ther ex, simple self-MLD, and skin care. Discussed progress towards goals.    Person(s) Educated  Patient    Methods  Explanation;Demonstration    Comprehension  Verbalized understanding;Returned demonstration;Need further instruction          OT Long Term Goals - 12/02/17 0916      OT LONG TERM GOAL #1   Title  Pt modified independent w/ lymphedema precautions/prevention principals and using printed reference to limit LE progression and infection risk.  (Pended)     Baseline  Max A  (Pended)     Time  2  (Pended)     Period  Weeks  (Pended)     Status  New  (Pended)       OT LONG TERM GOAL #2   Title  Lymphedema (LE) management/ self-care:  Pt to tolerate daily compression garments and/ or HOS devices in keeping w/ prescribed wear regime within 1 week of issue date to restore normal limb volume and shape, to reduce tissue congestion and infection risk, and to limit LE progression.  (Pended)     Baseline  Max A  (Pended)     Time  4  (Pended)     Period  Weeks  (Pended)     Status  New  (Pended)       OT LONG TERM GOAL #3   Title  Lymphedema (LE) management/ self-care:  Pt to achieve at least 5%  limb volume reduction below knee in LLE during modified Intensive Phase CDT to reduce limb volume, to reduce  infection risk and to limit LE progression   (Pended)     Baseline  Max A  (Pended)     Time  12  (Pended)     Period  Weeks  (Pended)     Status  New  (Pended)       OT LONG TERM GOAL #4   Title  Lymphedema management/ Self-care: Pt able to don and doff compression garments and/ or devices using assistive devices and extra times PRN in order to sustain clinical gains in liumb volume reduction and to ensure optimal lymphedema management over time.  (Pended)     Baseline  Max A  (Pended)  Time  12  (Pended)     Period  Weeks  (Pended)      Status  New  (Pended)       OT LONG TERM GOAL #5   Title  Lymphedema (LE) Pain: Pt to report 25% reduction in BLE pain/discomfort described as  "heaviness,  fullness, tightness"" by DC to improve standing and walking tolerance and to improve functional performance in all occupational domains.  (Pended)     Baseline  Max A  (Pended)     Time  12  (Pended)     Period  Weeks  (Pended)     Status  New  (Pended)       OT LONG TERM GOAL #6   Title  Lymphedema (LE) management/ self-care:  During Management Phase CDT Pt to sustain reduced limb volumes achieved during Intensive Phase CDT at follow up visits during Management Phase CDT within 3% using LE self-care home program components as directed ( simple self MLD, skin care, ther ex and daily/ nightly compression garments/ devices) .  (Pended)     Baseline  Max A  (Pended)     Time  6  (Pended)     Period  Months  (Pended)     Status  New  (Pended)             Plan - 12/02/17 1646    Clinical Impression Statement  BLE  comparative limb volumetrics reveal Limb volume differential measures 0,6%, L>R. Despite different distribution in the limbs, This value is evidence that limb volume between legs is essentially equal over all.Pt tolerated initaLL mld SESSION TO rle WITHOUT DIFFICULTY. nO COMPRESSION WRAPS AFTER MANUAL THERAPY ACCORDING TO PLAN AS WE WILL FIT ots COMPRESSION KNEE HIGHS, (CONSIDERING CCL 2) asaop IN LIEU IOF WRAPS AS SWELLING IS MILD. cONT AS PER zpoc W/ EMPHASIS ON mld AND EDU FOR le SELF CARE.    Occupational performance deficits (Please refer to evaluation for details):  ADL's;IADL's;Rest and Sleep;Work;Leisure;Social Participation;Other   Body Image   Rehab Potential  Good    OT Frequency  2x / week    OT Duration  12 weeks    OT Treatment/Interventions  Self-care/ADL training;Therapeutic exercise;Manual Therapy;Manual lymph drainage;DME and/or AE instruction;Compression bandaging;Other (comment);Patient/family education    skin care w/ low ph lotion and castor oil to improve hydration and flexibility\   Clinical Decision Making  Limited treatment options, no task modification necessary    OT Home Exercise Plan  lymphatic pumping ther ex    Recommended Other Services  fit qw/ ccl 2, knee length, OTS compression stockings. Consider Juzo 2002    Consulted and Agree with Plan of Care  Patient       Patient will benefit from skilled therapeutic intervention in order to improve the following deficits and impairments:  Decreased skin integrity, Decreased knowledge of precautions, Decreased activity tolerance, Decreased knowledge of use of DME, Impaired flexibility, Decreased balance, Decreased range of motion, Increased edema, Pain  Visit Diagnosis: Lymphedema, not elsewhere classified    Problem List Patient Active Problem List   Diagnosis Date Noted  . Hypothyroidism, postablative 09/29/2017  . Hyperthyroidism 02/26/2017  . Vertigo 02/26/2017  . Chronic venous insufficiency 01/10/2017  . Varicose veins of both lower extremities with complications 16/01/9603  . Preventative health care 07/04/2014  . Chronic maxillary sinusitis 07/04/2014  . Midline low back pain without sciatica 10/08/2013  . Encounter to establish care 10/08/2013  . Other specified hypothyroidism 10/08/2013  . Breast cancer  screening 10/08/2013  . Colon cancer screening 10/08/2013  . Subclinical hypothyroidism 10/02/2011  . HTN (hypertension), benign 06/01/2011  . Dyslipidemia 06/01/2011  . GERD (gastroesophageal reflux disease) 06/01/2011  . History of seasonal allergies 06/01/2011    Andrey Spearman, MS, OTR/L, [facility] 12/02/17 4:52 PM  [place] MAIN [facility] SERVICES [address] [place], [place], [address] Phone: [phone]   Fax:  [phone]  Name: Yazmen Briones MRN: [id number] Date of Birth: 04-03-1956

## 2017-12-02 NOTE — Addendum Note (Signed)
Addended by: Ansel Bong on: 12/02/2017 08:22 AM   Modules accepted: Orders

## 2017-12-03 ENCOUNTER — Encounter: Payer: Self-pay | Admitting: Endocrinology

## 2017-12-03 ENCOUNTER — Ambulatory Visit (INDEPENDENT_AMBULATORY_CARE_PROVIDER_SITE_OTHER): Payer: 59 | Admitting: Endocrinology

## 2017-12-03 VITALS — BP 118/64 | HR 72 | Ht 70.0 in | Wt 224.0 lb

## 2017-12-03 DIAGNOSIS — E890 Postprocedural hypothyroidism: Secondary | ICD-10-CM | POA: Diagnosis not present

## 2017-12-03 NOTE — Progress Notes (Signed)
Patient ID: Stacy Bryan, female   DOB: [birth year], 62 y.o.   MRN: [id number]                                                                                                              Reason for Appointment: Hypothyroidism, follow-up  Referring physician:Jegede   Chief complaint: Ankle pain   History of Present Illness:    Background history: About 2 years ago she had gone to see a physician because of problems with weight gain and fatigue that had been going on for a few months Also she was having some palpitations at that time but no sweating or heat intolerance or shakiness Not clear if she had a goiter in the initial exam and no records are available including baseline labs. However her TSH was low in 09/2011 She was told to have Hyperthyroidism and started on methimazole 5 mg daily, probably after her scan in 6/17 However she was not followed for too long and has been mostly followed by PCP with no change in her methimazole and only irregular labs and follow-up  RECENT history:  She had a toxic nodular goiter without Graves' disease diagnosed on initial evaluation.  She was treated with radioactive iodine, given 29.7 mCi on 06/06/17 Subsequently she has had HYPOTHYROIDISM  She was having fatigue and low free T4 level when she was started on 50 mcg of levothyroxine in 07/2017 On her last visit she was having some fatigue consistently and her dose was increased to 75 mcg  Although she does not think she feels tired as much she was concerned about having pain in her ankle area over the last month or so No symptoms of weight gain or cold intolerance She does take her thyroid supplement before breakfast  About 2 weeks ago because she thought her ankle pain was from her thyroid medication she stopped taking it and only went back on it a couple of days or so before her lab was done  Her TSH is still high at 6.7, no change in free T4  ?  Graves' ophthalmopathy: Her right eye  has been prominent and she does not think this has changed, she has seen an unknown ophthalmologist    Wt Readings from Last 3 Encounters:  12/03/17 224 lb (101.6 kg)  09/29/17 222 lb 12.8 oz (101.1 kg)  08/07/17 226 lb 6.4 oz (102.7 kg)     Thyroid function tests as follows:      Lab Results  Component Value Date   FREET4 0.76 11/28/2017   FREET4 0.77 09/25/2017   FREET4 0.56 (L) 08/04/2017   T3FREE 3.0 08/04/2017   T3FREE 4.7 (H) 07/01/2017   T3FREE 3.3 03/12/2017   TSH 6.72 (H) 11/28/2017   TSH 8.79 (H) 09/25/2017   TSH 1.80 08/04/2017     Lab Results  Component Value Date   THYROTRECAB <1.10 11/28/2017   THYROTRECAB <0.50 03/12/2017    THYROID SCAN done in 7/18 showed uptake of 40.1% and multinodular goiter   Allergies as of 12/03/2017  No Known Allergies     Medication List        Accurate as of 12/03/17  3:18 PM. Always use your most recent med list.          ALIVE ENERGY 50+ PO Take by mouth every morning.   aspirin EC 81 MG tablet Take 81 mg by mouth daily.   calcium carbonate 420 MG Chew chewable tablet Commonly known as:  [place] by mouth.   cyclobenzaprine 5 MG tablet Commonly known as:  FLEXERIL Take 1 tablet (5 mg total) 3 (three) times daily by mouth.   fish oil-omega-3 fatty acids 1000 MG capsule Take 2 g by mouth daily.   hydrochlorothiazide 12.5 MG capsule Commonly known as:  MICROZIDE Take 1 capsule (12.5 mg total) daily by mouth. Need office visit for more refills   ibuprofen 800 MG tablet Commonly known as:  ADVIL,MOTRIN TAKE 1 TABLET BY MOUTH EVERY 8 HOURS AS NEEDED   levothyroxine 75 MCG tablet Commonly known as:  SYNTHROID, LEVOTHROID Take 1 tablet (75 mcg total) by mouth daily.   magnesium oxide 400 MG tablet Commonly known as:  MAG-OX Take 400 mg by mouth daily.   meclizine 25 MG tablet Commonly known as:  ANTIVERT Take 1 tablet (25 mg total) every 6 (six) hours as needed by mouth for dizziness.   OVER  THE COUNTER MEDICATION Fish oil 1000 mg one tablet daily.   OVER THE COUNTER MEDICATION Chlor Tab, two tablets daily for allergies.   VITAMIN D-3 PO Take 400 Units by mouth daily.   ZINC 15 PO Take 15 mg by mouth daily.           Past Medical History:  Diagnosis Date  . Abnormal Pap smear   . Allergy   . Anemia   . GERD (gastroesophageal reflux disease)   . Hyperlipidemia   . Hypertension   . Thyroid disease     Past Surgical History:  Procedure Laterality Date  . ABDOMINAL HYSTERECTOMY  1997   total  . FOOT SURGERY     both feet  20 yrs ago  . OVARY SURGERY  1980's   removal of R ovary (cyst)    Family History  Problem Relation Age of Onset  . Hypertension Mother   . Heart disease Father   . Asthma Father   . Hyperlipidemia Father   . Asthma Sister   . Breast cancer Sister   . Stomach cancer Sister   . Thyroid disease Sister        Surgery  . Heart disease Sister   . Diabetes Sister   . Gout Sister   . Diabetes Sister   . Ulcers Sister   . Cancer Maternal Grandmother   . Colon cancer Neg Hx   . Esophageal cancer Neg Hx   . Liver cancer Neg Hx   . Pancreatic cancer Neg Hx   . Rectal cancer Neg Hx     Social History:  reports that she quit smoking about 11 years ago. She has never used smokeless tobacco. She reports that she does not drink alcohol or use drugs.  Allergies: No Known Allergies   Review of Systems    Examination:   BP 118/64   Pulse 72   Ht 5\' 10"  (1.778 m)   Wt 224 lb (101.6 kg)   BMI 32.14 kg/m           Eyes:  has a mild prominence of the right eye with mild upper lid  retraction  Thyroid not palpable Hands are not cold Biceps reflexes appear normal    Assessment/Plan:  HYPOTHYROIDISM secondary to I-131 treatment for toxic nodular goiter  She is currently on 75 mcg of levothyroxine Although she is subjectively not complaining of any fatigue and probably is feeling better than when she was on 50 mcg she has not  taken her medication regularly recently She continues to think that her thyroid medication was causing ankle pain and she felt better without it  However currently she is back on her thyroid supplement and is not having much ankle pain Explained to her that levothyroxine is a natural thyroid hormone and will not cause side effects with joint pain  Since she had not taken her levothyroxine consistently before her lab not clear if she needs a change in her dosage For now she will continue 75 mcg Discussed that she will likely need to take this lifelong  She will have a TSH done in another month or so and may need further adjustment of her dose  She will need to discuss her ankle pain with her PCP  Elayne Snare 12/03/2017, 3:18 PM       Note: This office note was prepared with Dragon voice recognition system technology. Any transcriptional errors that result from this process are unintentional.

## 2017-12-04 ENCOUNTER — Ambulatory Visit: Payer: 59 | Admitting: Occupational Therapy

## 2017-12-05 ENCOUNTER — Other Ambulatory Visit: Payer: Self-pay | Admitting: Internal Medicine

## 2017-12-05 DIAGNOSIS — Z1231 Encounter for screening mammogram for malignant neoplasm of breast: Secondary | ICD-10-CM

## 2017-12-09 ENCOUNTER — Encounter

## 2017-12-09 ENCOUNTER — Ambulatory Visit: Payer: 59 | Attending: Family Medicine | Admitting: Family Medicine

## 2017-12-09 ENCOUNTER — Encounter: Payer: Self-pay | Admitting: Family Medicine

## 2017-12-09 VITALS — BP 119/75 | HR 64 | Temp 98.1°F | Ht 70.0 in | Wt 228.2 lb

## 2017-12-09 DIAGNOSIS — M545 Low back pain: Secondary | ICD-10-CM | POA: Diagnosis not present

## 2017-12-09 DIAGNOSIS — E0520 Thyrotoxicosis with toxic multinodular goiter without thyrotoxic crisis or storm: Secondary | ICD-10-CM | POA: Insufficient documentation

## 2017-12-09 DIAGNOSIS — Z7982 Long term (current) use of aspirin: Secondary | ICD-10-CM | POA: Diagnosis not present

## 2017-12-09 DIAGNOSIS — E785 Hyperlipidemia, unspecified: Secondary | ICD-10-CM | POA: Diagnosis not present

## 2017-12-09 DIAGNOSIS — I10 Essential (primary) hypertension: Secondary | ICD-10-CM

## 2017-12-09 DIAGNOSIS — Z23 Encounter for immunization: Secondary | ICD-10-CM | POA: Diagnosis not present

## 2017-12-09 DIAGNOSIS — E039 Hypothyroidism, unspecified: Secondary | ICD-10-CM | POA: Insufficient documentation

## 2017-12-09 DIAGNOSIS — Z79899 Other long term (current) drug therapy: Secondary | ICD-10-CM | POA: Diagnosis not present

## 2017-12-09 DIAGNOSIS — G8929 Other chronic pain: Secondary | ICD-10-CM | POA: Insufficient documentation

## 2017-12-09 DIAGNOSIS — I872 Venous insufficiency (chronic) (peripheral): Secondary | ICD-10-CM | POA: Diagnosis not present

## 2017-12-09 DIAGNOSIS — Z79890 Hormone replacement therapy (postmenopausal): Secondary | ICD-10-CM | POA: Diagnosis not present

## 2017-12-09 DIAGNOSIS — Z1159 Encounter for screening for other viral diseases: Secondary | ICD-10-CM

## 2017-12-09 DIAGNOSIS — E890 Postprocedural hypothyroidism: Secondary | ICD-10-CM

## 2017-12-09 DIAGNOSIS — K219 Gastro-esophageal reflux disease without esophagitis: Secondary | ICD-10-CM | POA: Insufficient documentation

## 2017-12-09 DIAGNOSIS — I890 Lymphedema, not elsewhere classified: Secondary | ICD-10-CM | POA: Diagnosis not present

## 2017-12-09 MED ORDER — CYCLOBENZAPRINE HCL 5 MG PO TABS: 5.0000 mg | ORAL_TABLET | Freq: Three times a day (TID) | ORAL | 3 refills | Status: DC

## 2017-12-09 MED ORDER — HYDROCHLOROTHIAZIDE 12.5 MG PO CAPS: 12.5000 mg | ORAL_CAPSULE | Freq: Every day | ORAL | 3 refills | Status: DC

## 2017-12-09 MED ORDER — IBUPROFEN 800 MG PO TABS: 800.0000 mg | ORAL_TABLET | Freq: Two times a day (BID) | ORAL | 1 refills | Status: DC | PRN

## 2017-12-09 NOTE — Patient Instructions (Signed)

## 2017-12-09 NOTE — Progress Notes (Signed)
Subjective:  Patient ID: Stacy Bryan, female    DOB: 1955/08/17  Age: 62 y.o. MRN: [id number]  CC: Hypothyroidism   HPI Stacy Bryan is a 62 year old female with a history of hypertension, chronic low back pain, hypothyroidism and (secondary to radioactive treatment for multinodular goiter), chronic venous insufficiency, lymphedema who presents today to establish care with me as she was previously followed by Dr. Doreene Burke. Her last office visit with her endocrinologist, Dr. Dwyane Dee was last week at which time she had been taking her levothyroxine intermittently, last TSH was elevated. Today she complains of right-sided low back pain which occurred 2 weeks ago when she attempted to lifted a case of water up a flight of stairs.  Pain does not radiate and is described as a 3-4/10 at this time but gets to a 10/10 at night.  She uses her muscle relaxant and ibuprofen with some relief and has been applying a heating pad to it. She does have chronic venous insufficiency is currently undergoing occupational therapy for lymphedema. States she was placed on hydrochlorothiazide by her previous PCP for pedal edema however she was informed by the occupational therapist that it was not effective with regards to her lymphedema.  Past Medical History:  Diagnosis Date  . Abnormal Pap smear   . Allergy   . Anemia   . GERD (gastroesophageal reflux disease)   . Hyperlipidemia   . Hypertension   . Thyroid disease     Past Surgical History:  Procedure Laterality Date  . ABDOMINAL HYSTERECTOMY  1997   total  . FOOT SURGERY     both feet  20 yrs ago  . OVARY SURGERY  1980's   removal of R ovary (cyst)    No Known Allergies   Outpatient Medications Prior to Visit  Medication Sig Dispense Refill  . aspirin EC 81 MG tablet Take 81 mg by mouth daily.    . calcium carbonate (TITRALAC) 420 MG CHEW chewable tablet Chew by mouth.    . Cholecalciferol (VITAMIN D-3 PO) Take 400 Units by mouth  daily.     . fish oil-omega-3 fatty acids 1000 MG capsule Take 2 g by mouth daily.    Marland Kitchen levothyroxine (SYNTHROID, LEVOTHROID) 75 MCG tablet Take 1 tablet (75 mcg total) by mouth daily. 30 tablet 3  . magnesium oxide (MAG-OX) 400 MG tablet Take 400 mg by mouth daily.     . meclizine (ANTIVERT) 25 MG tablet Take 1 tablet (25 mg total) every 6 (six) hours as needed by mouth for dizziness. 30 tablet 1  . Multiple Vitamins-Minerals (ALIVE ENERGY 50+ PO) Take by mouth every morning.    Marland Kitchen OVER THE COUNTER MEDICATION Fish oil 1000 mg one tablet daily.    Marland Kitchen OVER THE COUNTER MEDICATION Chlor Tab, two tablets daily for allergies.    . Zinc Sulfate (ZINC 15 PO) Take 15 mg by mouth daily.     . cyclobenzaprine (FLEXERIL) 5 MG tablet Take 1 tablet (5 mg total) 3 (three) times daily by mouth. 60 tablet 3  . hydrochlorothiazide (MICROZIDE) 12.5 MG capsule Take 1 capsule (12.5 mg total) daily by mouth. Need office visit for more refills 30 capsule 3  . ibuprofen (ADVIL,MOTRIN) 800 MG tablet TAKE 1 TABLET BY MOUTH EVERY 8 HOURS AS NEEDED 60 tablet 0   Facility-Administered Medications Prior to Visit  Medication Dose Route Frequency Provider Last Rate Last Dose  . 0.9 %  sodium chloride infusion  500 mL Intravenous Once [place] C[place]  P, MD        ROS Review of Systems  Constitutional: Negative for activity change, appetite change and fatigue.  HENT: Negative for congestion, sinus pressure and sore throat.   Eyes: Negative for visual disturbance.  Respiratory: Negative for cough, chest tightness, shortness of breath and wheezing.   Cardiovascular: Positive for leg swelling. Negative for chest pain and palpitations.  Gastrointestinal: Negative for abdominal distention, abdominal pain and constipation.  Endocrine: Negative for polydipsia.  Genitourinary: Negative for dysuria and frequency.  Musculoskeletal: Positive for back pain. Negative for arthralgias.  Skin: Negative for rash.  Neurological:  Negative for tremors, light-headedness and numbness.  Hematological: Does not bruise/bleed easily.  Psychiatric/Behavioral: Negative for agitation and behavioral problems.    Objective:  BP 119/75   Pulse 64   Temp 98.1 F (36.7 C) (Oral)   Ht '5\' 10"'  (1.778 m)   Wt 228 lb 3.2 oz (103.5 kg)   BMI 32.74 kg/m   BP/Weight 12/09/2017 12/03/2017 0/01/711  Systolic BP 197 588 325  Diastolic BP 75 64 88  Wt. (Lbs) 228.2 224 222.8  BMI 32.74 32.14 31.97      Physical Exam  Constitutional: She is oriented to person, place, and time. She appears well-developed and well-nourished.  Cardiovascular: Normal rate, normal heart sounds and intact distal pulses.  No murmur heard. Pulmonary/Chest: Effort normal and breath sounds normal. She has no wheezes. She has no rales. She exhibits no tenderness.  Abdominal: Soft. Bowel sounds are normal. She exhibits no distension and no mass. There is no tenderness.  Musculoskeletal: Normal range of motion.  Slight tenderness on palpation of the right lateral aspect of her lumbar region. Negative straight leg raise bilaterally  Neurological: She is alert and oriented to person, place, and time. She displays normal reflexes. Coordination normal.  Normal motor strength in bilateral lower extremities  Skin: Skin is warm and dry.  Psychiatric: She has a normal mood and affect.     CMP Latest Ref Rng & Units 02/26/2017 01/23/2017 06/05/2015  Glucose 65 - 99 mg/dL 103(H) 122(H) 133(H)  BUN 8 - 27 mg/dL '14 14 17  ' Creatinine 0.57 - 1.00 mg/dL 0.85 0.80 0.69  Sodium 134 - 144 mmol/L 143 140 141  Potassium 3.5 - 5.2 mmol/L 4.4 3.9 4.1  Chloride 96 - 106 mmol/L 104 106 108  CO2 20 - 29 mmol/L '25 25 26  ' Calcium 8.7 - 10.3 mg/dL 9.5 9.3 9.4  Total Protein 6.0 - 8.5 g/dL 6.9 - 7.4  Total Bilirubin 0.0 - 1.2 mg/dL 0.3 - 0.7  Alkaline Phos 39 - 117 IU/L 103 - 88  AST 0 - 40 IU/L 17 - 21  ALT 0 - 32 IU/L 21 - 23    Lipid Panel     Component Value Date/Time     CHOL 200 (H) 02/26/2017 1113   TRIG 169 (H) 02/26/2017 1113   HDL 48 02/26/2017 1113   CHOLHDL 4.2 02/26/2017 1113   CHOLHDL 4.8 10/08/2013 1130   VLDL 35 10/08/2013 1130   LDLCALC 118 (H) 02/26/2017 1113     Assessment & Plan:   1. Chronic midline low back pain without sciatica Musculoskeletal Advised to apply heat Would love to refer for physical therapy however her schedule precludes this at this time. Provided instructions on back exercises and if symptoms persist she will contact the clinic so we can place a referral to PT - ibuprofen (ADVIL,MOTRIN) 800 MG tablet; Take 1 tablet (800 mg total) by mouth every  12 (twelve) hours as needed.  Dispense: 60 tablet; Refill: 1 - cyclobenzaprine (FLEXERIL) 5 MG tablet; Take 1 tablet (5 mg total) by mouth 3 (three) times daily.  Dispense: 60 tablet; Refill: 3  2. HTN (hypertension), benign Controlled Counseled on blood pressure goal of less than 130/80, low-sodium, DASH diet, medication compliance, 150 minutes of moderate intensity exercise per week. Discussed medication compliance, adverse effects. - CMP14+EGFR; Future - Lipid panel; Future - hydrochlorothiazide (MICROZIDE) 12.5 MG capsule; Take 1 capsule (12.5 mg total) by mouth daily. Need office visit for more refills  Dispense: 30 capsule; Refill: 3  3. Need for hepatitis C screening test - Hepatitis c antibody (reflex); Future  4. Chronic venous insufficiency Currently followed by vascular Undergoing OT for lymphedema  5.  Hypothyroidism Secondary to radioactive iodine treatment for toxic nodular goiter Continue levothyroxine Followed by endocrine  Meds ordered this encounter  Medications  . ibuprofen (ADVIL,MOTRIN) 800 MG tablet    Sig: Take 1 tablet (800 mg total) by mouth every 12 (twelve) hours as needed.    Dispense:  60 tablet    Refill:  1    Please consider 90 day supplies to promote better adherence  . cyclobenzaprine (FLEXERIL) 5 MG tablet    Sig: Take 1  tablet (5 mg total) by mouth 3 (three) times daily.    Dispense:  60 tablet    Refill:  3  . hydrochlorothiazide (MICROZIDE) 12.5 MG capsule    Sig: Take 1 capsule (12.5 mg total) by mouth daily. Need office visit for more refills    Dispense:  30 capsule    Refill:  3    Follow-up: Return in about 6 months (around 06/11/2018) for Follow-up of chronic medical conditions.   Charlott Rakes MD

## 2017-12-11 ENCOUNTER — Ambulatory Visit: Payer: 59 | Admitting: Occupational Therapy

## 2017-12-12 ENCOUNTER — Ambulatory Visit: Payer: 59 | Attending: Family Medicine

## 2017-12-12 DIAGNOSIS — I10 Essential (primary) hypertension: Secondary | ICD-10-CM | POA: Insufficient documentation

## 2017-12-12 DIAGNOSIS — Z1159 Encounter for screening for other viral diseases: Secondary | ICD-10-CM | POA: Diagnosis not present

## 2017-12-12 NOTE — Progress Notes (Signed)
Patient here for lab visit only 

## 2017-12-16 ENCOUNTER — Encounter: Payer: 59 | Admitting: Occupational Therapy

## 2017-12-18 ENCOUNTER — Encounter: Payer: 59 | Admitting: Occupational Therapy

## 2017-12-23 ENCOUNTER — Encounter: Payer: 59 | Admitting: Occupational Therapy

## 2017-12-25 ENCOUNTER — Encounter: Payer: 59 | Admitting: Occupational Therapy

## 2017-12-30 ENCOUNTER — Encounter: Payer: 59 | Admitting: Occupational Therapy

## 2018-01-01 ENCOUNTER — Encounter: Payer: 59 | Admitting: Occupational Therapy

## 2018-01-02 ENCOUNTER — Ambulatory Visit: Payer: 59

## 2018-01-06 ENCOUNTER — Encounter: Payer: 59 | Admitting: Occupational Therapy

## 2018-01-08 ENCOUNTER — Encounter: Payer: 59 | Admitting: Occupational Therapy

## 2018-01-13 ENCOUNTER — Encounter: Payer: 59 | Admitting: Occupational Therapy

## 2018-01-14 ENCOUNTER — Other Ambulatory Visit (INDEPENDENT_AMBULATORY_CARE_PROVIDER_SITE_OTHER): Payer: 59

## 2018-01-14 ENCOUNTER — Encounter: Payer: 59 | Admitting: Occupational Therapy

## 2018-01-14 DIAGNOSIS — E890 Postprocedural hypothyroidism: Secondary | ICD-10-CM | POA: Diagnosis not present

## 2018-01-14 LAB — T4, FREE: Free T4: 0.94 ng/dL (ref 0.60–1.60)

## 2018-01-14 LAB — TSH: TSH: 2.36 u[IU]/mL (ref 0.35–4.50)

## 2018-01-15 ENCOUNTER — Encounter: Payer: 59 | Admitting: Occupational Therapy

## 2018-01-18 NOTE — Progress Notes (Signed)
Patient ID: Stacy Bryan, female   DOB: [birth year], 62 y.o.   MRN: [id number]                                                                                                              Reason for Appointment: Hypothyroidism, follow-up  Referring physician:Jegede   Chief complaint: Ankle pain   History of Present Illness:    Background history: About 2 years ago she had gone to see a physician because of problems with weight gain and fatigue that had been going on for a few months Also she was having some palpitations at that time but no sweating or heat intolerance or shakiness Not clear if she had a goiter in the initial exam and no records are available including baseline labs. However her TSH was low in 09/2011 She was told to have Hyperthyroidism and started on methimazole 5 mg daily, probably after her scan in 6/17 However she was not followed for too long and has been mostly followed by PCP with no change in her methimazole and only irregular labs and follow-up  RECENT history:  She had a toxic nodular goiter without Graves' disease diagnosed on initial evaluation. She was treated with radioactive iodine, given 29.7 mCi on 06/06/17  Subsequently she has had HYPOTHYROIDISM  She was having fatigue and low free T4 level when she was started on 50 mcg of levothyroxine in 07/2017 In 6/19 she was having some fatigue consistently and her dose was increased to 75 mcg and the dose has been continued unchanged  Although she did not report feeling fatigued her last 2 visits she now thinks that she is having long-standing fatigue that is not any different Previously however irregular with taking prior to her visit in 8/19 since she thought it was causing ankle pain and her TSH was high  Her TSH is now back to normal at 2.3  ?  Graves' ophthalmopathy: Her right eye has been prominent and she does not think this has changed, she has seen an unknown ophthalmologist    Wt Readings  from Last 3 Encounters:  01/19/18 227 lb 12.8 oz (103.3 kg)  12/09/17 228 lb 3.2 oz (103.5 kg)  12/03/17 224 lb (101.6 kg)     Thyroid function tests as follows:      Lab Results  Component Value Date   FREET4 0.94 01/14/2018   FREET4 0.76 11/28/2017   FREET4 0.77 09/25/2017   T3FREE 3.0 08/04/2017   T3FREE 4.7 (H) 07/01/2017   T3FREE 3.3 03/12/2017   TSH 2.36 01/14/2018   TSH 6.72 (H) 11/28/2017   TSH 8.79 (H) 09/25/2017     Lab Results  Component Value Date   THYROTRECAB <1.10 11/28/2017   THYROTRECAB <0.50 03/12/2017    THYROID SCAN done in 7/18 showed uptake of 40.1% and multinodular goiter   Allergies as of 01/19/2018   No Known Allergies     Medication List        Accurate as of 01/19/18  8:50 AM. Always use your most  recent med list.          ALIVE ENERGY 50+ PO Take by mouth every morning.   aspirin EC 81 MG tablet Take 81 mg by mouth daily.   calcium carbonate 420 MG Chew chewable tablet Commonly known as:  [place] by mouth.   cyclobenzaprine 5 MG tablet Commonly known as:  FLEXERIL Take 1 tablet (5 mg total) by mouth 3 (three) times daily.   fish oil-omega-3 fatty acids 1000 MG capsule Take 2 g by mouth daily.   hydrochlorothiazide 12.5 MG capsule Commonly known as:  MICROZIDE Take 1 capsule (12.5 mg total) by mouth daily. Need office visit for more refills   ibuprofen 800 MG tablet Commonly known as:  ADVIL,MOTRIN Take 1 tablet (800 mg total) by mouth every 12 (twelve) hours as needed.   levothyroxine 75 MCG tablet Commonly known as:  SYNTHROID, LEVOTHROID Take 1 tablet (75 mcg total) by mouth daily.   magnesium oxide 400 MG tablet Commonly known as:  MAG-OX Take 400 mg by mouth daily.   meclizine 25 MG tablet Commonly known as:  ANTIVERT Take 1 tablet (25 mg total) every 6 (six) hours as needed by mouth for dizziness.   OVER THE COUNTER MEDICATION Fish oil 1000 mg one tablet daily.   OVER THE COUNTER MEDICATION Chlor  Tab, two tablets daily for allergies.   VITAMIN D-3 PO Take 400 Units by mouth daily.   ZINC 15 PO Take 15 mg by mouth daily.           Past Medical History:  Diagnosis Date  . Abnormal Pap smear   . Allergy   . Anemia   . GERD (gastroesophageal reflux disease)   . Hyperlipidemia   . Hypertension   . Thyroid disease     Past Surgical History:  Procedure Laterality Date  . ABDOMINAL HYSTERECTOMY  1997   total  . FOOT SURGERY     both feet  20 yrs ago  . OVARY SURGERY  1980's   removal of R ovary (cyst)    Family History  Problem Relation Age of Onset  . Hypertension Mother   . Heart disease Father   . Asthma Father   . Hyperlipidemia Father   . Asthma Sister   . Breast cancer Sister   . Stomach cancer Sister   . Thyroid disease Sister        Surgery  . Heart disease Sister   . Diabetes Sister   . Gout Sister   . Diabetes Sister   . Ulcers Sister   . Cancer Maternal Grandmother   . Colon cancer Neg Hx   . Esophageal cancer Neg Hx   . Liver cancer Neg Hx   . Pancreatic cancer Neg Hx   . Rectal cancer Neg Hx     Social History:  reports that she quit smoking about 11 years ago. She has never used smokeless tobacco. She reports that she does not drink alcohol or use drugs.  Allergies: No Known Allergies   Review of Systems    Examination:   BP 140/78   Pulse 78   Temp 98.2 F (36.8 C) (Oral)   Ht 5\' 10"  (1.778 m)   Wt 227 lb 12.8 oz (103.3 kg)   SpO2 94%   BMI 32.69 kg/m         She looks well  Thyroid not palpable Skin appears normal    Assessment/Plan:  HYPOTHYROIDISM secondary to I-131 treatment for toxic nodular goiter  She has mild post ablative hypothyroidism  With a dose of 75 mcg daily her TSH is back to normal with her being consistent on her thyroid supplement  However is currently complaining of fatigue which has been persistent for quite some time and likely unrelated to her thyroid levels  On 75 mcg of levothyroxine  her TSH is quite normal and free T4 improved For now she will continue 75 mcg Discussed that she will likely need to take this lifelong  She will have a TSH done in another 6 months and follow-up periodically  Follow-up with PCP for continuing evaluation of her fatigue and possible sleep disorder    Stacy Bryan 01/19/2018, 8:50 AM       Note: This office note was prepared with Dragon voice recognition system technology. Any transcriptional errors that result from this process are unintentional.

## 2018-01-19 ENCOUNTER — Encounter: Payer: 59 | Admitting: Occupational Therapy

## 2018-01-19 ENCOUNTER — Ambulatory Visit (INDEPENDENT_AMBULATORY_CARE_PROVIDER_SITE_OTHER): Payer: 59 | Admitting: Endocrinology

## 2018-01-19 VITALS — BP 140/78 | HR 78 | Temp 98.2°F | Ht 70.0 in | Wt 227.8 lb

## 2018-01-19 DIAGNOSIS — E890 Postprocedural hypothyroidism: Secondary | ICD-10-CM

## 2018-01-20 ENCOUNTER — Encounter: Payer: 59 | Admitting: Occupational Therapy

## 2018-01-22 ENCOUNTER — Encounter: Payer: 59 | Admitting: Occupational Therapy

## 2018-02-10 ENCOUNTER — Ambulatory Visit: Payer: 59 | Admitting: Internal Medicine

## 2018-02-20 ENCOUNTER — Ambulatory Visit: Payer: 59 | Admitting: Internal Medicine

## 2018-02-25 ENCOUNTER — Ambulatory Visit: Payer: 59

## 2018-02-26 ENCOUNTER — Ambulatory Visit: Payer: 59

## 2018-03-05 ENCOUNTER — Emergency Department (HOSPITAL_COMMUNITY)
Admission: EM | Admit: 2018-03-05 | Discharge: 2018-03-05 | Disposition: A | Discharge status: Home / Self Care | Payer: 59 | Attending: Emergency Medicine | Admitting: Emergency Medicine

## 2018-03-05 ENCOUNTER — Other Ambulatory Visit: Payer: Self-pay | Admitting: Endocrinology

## 2018-03-05 ENCOUNTER — Other Ambulatory Visit: Payer: Self-pay

## 2018-03-05 ENCOUNTER — Emergency Department (HOSPITAL_COMMUNITY): Payer: 59

## 2018-03-05 ENCOUNTER — Encounter (HOSPITAL_COMMUNITY): Payer: Self-pay

## 2018-03-05 DIAGNOSIS — Z87891 Personal history of nicotine dependence: Secondary | ICD-10-CM | POA: Insufficient documentation

## 2018-03-05 DIAGNOSIS — R42 Dizziness and giddiness: Secondary | ICD-10-CM | POA: Insufficient documentation

## 2018-03-05 DIAGNOSIS — Z79899 Other long term (current) drug therapy: Secondary | ICD-10-CM | POA: Diagnosis not present

## 2018-03-05 DIAGNOSIS — Z7982 Long term (current) use of aspirin: Secondary | ICD-10-CM | POA: Diagnosis not present

## 2018-03-05 DIAGNOSIS — I10 Essential (primary) hypertension: Secondary | ICD-10-CM | POA: Diagnosis not present

## 2018-03-05 HISTORY — DX: Dizziness and giddiness: R42

## 2018-03-05 LAB — COMPREHENSIVE METABOLIC PANEL
ALK PHOS: 82 U/L (ref 38–126)
ALT: 25 U/L (ref 0–44)
AST: 20 U/L (ref 15–41)
Albumin: 4.2 g/dL (ref 3.5–5.0)
Anion gap: 9 (ref 5–15)
BILIRUBIN TOTAL: 0.6 mg/dL (ref 0.3–1.2)
BUN: 16 mg/dL (ref 8–23)
CALCIUM: 9.3 mg/dL (ref 8.9–10.3)
CO2: 24 mmol/L (ref 22–32)
CREATININE: 0.87 mg/dL (ref 0.44–1.00)
Chloride: 108 mmol/L (ref 98–111)
Glucose, Bld: 107 mg/dL — ABNORMAL HIGH (ref 70–99)
Potassium: 3.6 mmol/L (ref 3.5–5.1)
Sodium: 141 mmol/L (ref 135–145)
Total Protein: 7.7 g/dL (ref 6.5–8.1)

## 2018-03-05 LAB — CBC WITH DIFFERENTIAL/PLATELET
ABS IMMATURE GRANULOCYTES: 0.01 10*3/uL (ref 0.00–0.07)
BASOS PCT: 1 %
Basophils Absolute: 0 10*3/uL (ref 0.0–0.1)
Eosinophils Absolute: 0.2 10*3/uL (ref 0.0–0.5)
Eosinophils Relative: 3 %
HCT: 44.3 % (ref 36.0–46.0)
HEMOGLOBIN: 14.2 g/dL (ref 12.0–15.0)
Immature Granulocytes: 0 %
Lymphocytes Relative: 30 %
Lymphs Abs: 1.5 10*3/uL (ref 0.7–4.0)
MCH: 27.6 pg (ref 26.0–34.0)
MCHC: 32.1 g/dL (ref 30.0–36.0)
MCV: 86.2 fL (ref 80.0–100.0)
MONO ABS: 0.4 10*3/uL (ref 0.1–1.0)
MONOS PCT: 7 %
NEUTROS PCT: 59 %
Neutro Abs: 2.9 10*3/uL (ref 1.7–7.7)
Platelets: 249 10*3/uL (ref 150–400)
RBC: 5.14 MIL/uL — ABNORMAL HIGH (ref 3.87–5.11)
RDW: 13.2 % (ref 11.5–15.5)
WBC: 5 10*3/uL (ref 4.0–10.5)
nRBC: 0 % (ref 0.0–0.2)

## 2018-03-05 MED ORDER — LORAZEPAM 0.5 MG PO TABS
0.5000 mg | ORAL_TABLET | Freq: Once | ORAL | Status: AC
  Administered 2018-03-05: 0.5 mg via ORAL
  Filled 2018-03-05: qty 1

## 2018-03-05 MED ORDER — MECLIZINE HCL 25 MG PO TABS: 25.0000 mg | ORAL_TABLET | Freq: Four times a day (QID) | ORAL | 1 refills | Status: DC | PRN

## 2018-03-05 MED ORDER — LORAZEPAM 1 MG PO TABS: 1.0000 mg | ORAL_TABLET | Freq: Three times a day (TID) | ORAL | 0 refills | Status: DC | PRN

## 2018-03-05 NOTE — ED Provider Notes (Signed)
[facility] DEPT Provider Note   CSN: [id number] Arrival date & time: 03/05/18  1328     History   Chief Complaint Chief Complaint  Patient presents with  . Dizziness  . Headache  . Neck Pain    HPI Stacy Bryan is a 62 y.o. female.  The history is provided by the patient. No language interpreter was used.  Dizziness  Quality:  Vertigo Severity:  Moderate Onset quality:  Gradual Duration:  2 days Timing:  Constant Progression:  Worsening Chronicity:  New Relieved by:  Nothing Worsened by:  Nothing Ineffective treatments: meclizine. Associated symptoms: headaches   Risk factors: hx of vertigo   Headache    Neck Pain   Associated symptoms include headaches.  Pt reports she has had vertigo in the past.  Pt reports her symptoms started 2 days ago. Pt took meclizine with no relief.  Pt reports she thinks meclizine was old.   Past Medical History:  Diagnosis Date  . Abnormal Pap smear   . Allergy   . Anemia   . GERD (gastroesophageal reflux disease)   . Hyperlipidemia   . Hypertension   . Thyroid disease   . Vertigo     Patient Active Problem List   Diagnosis Date Noted  . Hypothyroidism, postablative 09/29/2017  . Hyperthyroidism 02/26/2017  . Vertigo 02/26/2017  . Chronic venous insufficiency 01/10/2017  . Varicose veins of both lower extremities with complications 95/63/8756  . Preventative health care 07/04/2014  . Chronic maxillary sinusitis 07/04/2014  . Midline low back pain without sciatica 10/08/2013  . Encounter to establish care 10/08/2013  . Other specified hypothyroidism 10/08/2013  . Breast cancer screening 10/08/2013  . Colon cancer screening 10/08/2013  . Subclinical hypothyroidism 10/02/2011  . HTN (hypertension), benign 06/01/2011  . Dyslipidemia 06/01/2011  . GERD (gastroesophageal reflux disease) 06/01/2011  . History of seasonal allergies 06/01/2011    Past Surgical History:  Procedure  Laterality Date  . ABDOMINAL HYSTERECTOMY  1997   total  . FOOT SURGERY     both feet  20 yrs ago  . OVARY SURGERY  1980's   removal of R ovary (cyst)     OB History   None      Home Medications    Prior to Admission medications   Medication Sig Start Date End Date Taking? Authorizing Provider  aspirin EC 81 MG tablet Take 81 mg by mouth daily.   Yes [provider]  calcium carbonate (TITRALAC) 420 MG CHEW chewable tablet Chew by mouth.   Yes [provider]  Cholecalciferol (VITAMIN D-3 PO) Take 400 Units by mouth daily.    Yes [provider]  cyclobenzaprine (FLEXERIL) 5 MG tablet Take 1 tablet (5 mg total) by mouth 3 (three) times daily. Patient taking differently: Take 5 mg by mouth 3 (three) times daily as needed for muscle spasms.  12/09/17  Yes Charlott Rakes, MD  fish oil-omega-3 fatty acids 1000 MG capsule Take 2 g by mouth daily.   Yes [provider]  hydrochlorothiazide (MICROZIDE) 12.5 MG capsule Take 1 capsule (12.5 mg total) by mouth daily. Need office visit for more refills 12/09/17  Yes Charlott Rakes, MD  ibuprofen (ADVIL,MOTRIN) 800 MG tablet Take 1 tablet (800 mg total) by mouth every 12 (twelve) hours as needed. 12/09/17  Yes Charlott Rakes, MD  levothyroxine (SYNTHROID, LEVOTHROID) 75 MCG tablet Take 1 tablet (75 mcg total) by mouth daily. 09/29/17  Yes Elayne Snare, MD  magnesium oxide (  MAG-OX) 400 MG tablet Take 400 mg by mouth daily.    Yes [provider]  OVER THE COUNTER MEDICATION Chlor Tab, two tablets daily for allergies.   Yes [provider]  LORazepam (ATIVAN) 1 MG tablet Take 1 tablet (1 mg total) by mouth 3 (three) times daily as needed for anxiety. 03/05/18   Fransico Meadow, PA-C  meclizine (ANTIVERT) 25 MG tablet Take 1 tablet (25 mg total) by mouth every 6 (six) hours as needed for dizziness. 03/05/18   Fransico Meadow, PA-C    Family History Family History  Problem Relation Age of Onset    . Hypertension Mother   . Heart disease Father   . Asthma Father   . Hyperlipidemia Father   . Asthma Sister   . Breast cancer Sister   . Stomach cancer Sister   . Thyroid disease Sister        Surgery  . Heart disease Sister   . Diabetes Sister   . Gout Sister   . Diabetes Sister   . Ulcers Sister   . Cancer Maternal Grandmother   . Colon cancer Neg Hx   . Esophageal cancer Neg Hx   . Liver cancer Neg Hx   . Pancreatic cancer Neg Hx   . Rectal cancer Neg Hx     Social History Social History   Tobacco Use  . Smoking status: Former Smoker    Last attempt to quit: 06/02/2006    Years since quitting: 11.7  . Smokeless tobacco: Never Used  Substance Use Topics  . Alcohol use: Yes    Comment: occassionally  . Drug use: No     Allergies   Patient has no known allergies.   Review of Systems Review of Systems  Musculoskeletal: Positive for neck pain.  Neurological: Positive for dizziness and headaches.  All other systems reviewed and are negative.    Physical Exam Updated Vital Signs BP (!) 143/90   Pulse (!) 55   Temp 98.1 F (36.7 C) (Oral)   Resp 15   Ht 5\' 10"  (1.778 m)   Wt 104.3 kg   SpO2 100%   BMI 33.00 kg/m   Physical Exam  Constitutional: She is oriented to person, place, and time. She appears well-developed and well-nourished.  HENT:  Head: Normocephalic.  Mouth/Throat: Oropharynx is clear and moist.  Eyes: Pupils are equal, round, and reactive to light. EOM are normal.  Neck: Normal range of motion.  Cardiovascular: Normal rate and normal heart sounds.  Pulmonary/Chest: Effort normal.  Abdominal: She exhibits no distension.  Musculoskeletal: Normal range of motion.  Neurological: She is alert and oriented to person, place, and time. She has normal strength.  Skin: Skin is warm.  Psychiatric: She has a normal mood and affect.  Nursing note and vitals reviewed.    ED Treatments / Results  Labs (all labs ordered are listed, but only  abnormal results are displayed) Labs Reviewed  CBC WITH DIFFERENTIAL/PLATELET - Abnormal; Notable for the following components:      Result Value   RBC 5.14 (*)    All other components within normal limits  COMPREHENSIVE METABOLIC PANEL - Abnormal; Notable for the following components:   Glucose, Bld 107 (*)    All other components within normal limits  URINALYSIS, ROUTINE W REFLEX MICROSCOPIC    EKG EKG Interpretation  Date/Time:  Thursday March 05 2018 14:28:50 EST Ventricular Rate:  56 PR Interval:    QRS Duration: 75 QT Interval:  426 QTC Calculation: 412 R Axis:   12 Text Interpretation:  Sinus rhythm Probable left atrial enlargement Low voltage, precordial leads Minimal ST elevation, lateral leads No significant change since last tracing Confirmed by Isla Pence [phone]) on 03/05/2018 2:31:24 PM   Radiology Ct Head Wo Contrast  Result Date: 03/05/2018 CLINICAL DATA:  Vertigo x3 days EXAM: CT HEAD WITHOUT CONTRAST TECHNIQUE: Contiguous axial images were obtained from the base of the skull through the vertex without intravenous contrast. COMPARISON:  01/23/2017 FINDINGS: Brain: Scattered mild-to-moderate small vessel ischemic disease of periventricular and subcortical white matter, chronic in appearance. No large vascular territory infarction, hemorrhage or midline shift. No hydrocephalus. Midline fourth ventricle and basal cisterns without effacement. No intra-axial mass nor extra-axial fluid collections. Vascular: Atherosclerosis of the carotid siphons. No hyperdense vessels. Skull: Intact Sinuses/Orbits: Nonacute Other: None IMPRESSION: Chronic appearing small vessel ischemic disease of periventricular and subcortical white matter. No acute intracranial abnormality. Electronically Signed   By: Ashley Royalty M.D.   On: 03/05/2018 14:57    Procedures Procedures (including critical care time)  Medications Ordered in ED Medications  LORazepam (ATIVAN) tablet 0.5 mg (0.5 mg  Oral Given 03/05/18 1423)     Initial Impression / Assessment and Plan / ED Course  I have reviewed the triage vital signs and the nursing notes.  Pertinent labs & imaging results that were available during my care of the patient were reviewed by me and considered in my medical decision making (see chart for details).     Pt given po ativan.  Pt reports symptoms have improved.  Ct scan shows no acute change.   Pt given rx for ativan and antivert.   Final Clinical Impressions(s) / ED Diagnoses   Final diagnoses:  Vertigo    ED Discharge Orders         Ordered    meclizine (ANTIVERT) 25 MG tablet  Every 6 hours PRN     03/05/18 1656    LORazepam (ATIVAN) 1 MG tablet  3 times daily PRN,   Status:  Discontinued     03/05/18 1656    LORazepam (ATIVAN) 1 MG tablet  3 times daily PRN     03/05/18 1658        An After Visit Summary was printed and given to the patient.    Fransico Meadow, [place] 03/05/18 1931    Pattricia Boss, MD 03/05/18 [phone]

## 2018-03-05 NOTE — Discharge Instructions (Addendum)
See your Physician for recheck next week if symptoms persist

## 2018-03-05 NOTE — ED Notes (Signed)
Informed patient that we are waiting for a urine sample. Patient also has water beverage at bedside.

## 2018-03-05 NOTE — ED Triage Notes (Addendum)
Patient has a history of vertigo. Patient c/o dizziness x 3 days. Patient  states she had nausea yesterday. paitent states she was diagnosed with vertigo a couple of weeks.   patient c/o headache and left posterior neck pain x 2 days.

## 2018-06-01 DIAGNOSIS — H1131 Conjunctival hemorrhage, right eye: Secondary | ICD-10-CM | POA: Diagnosis not present

## 2018-07-13 ENCOUNTER — Other Ambulatory Visit: Payer: Self-pay

## 2018-07-13 ENCOUNTER — Other Ambulatory Visit (INDEPENDENT_AMBULATORY_CARE_PROVIDER_SITE_OTHER): Payer: 59

## 2018-07-13 DIAGNOSIS — E890 Postprocedural hypothyroidism: Secondary | ICD-10-CM | POA: Diagnosis not present

## 2018-07-13 LAB — TSH: TSH: 1.89 u[IU]/mL (ref 0.35–4.50)

## 2018-07-13 LAB — T4, FREE: FREE T4: 0.91 ng/dL (ref 0.60–1.60)

## 2018-07-21 ENCOUNTER — Encounter: Payer: Self-pay | Admitting: Endocrinology

## 2018-07-21 ENCOUNTER — Telehealth: Payer: Self-pay | Admitting: Endocrinology

## 2018-07-21 ENCOUNTER — Ambulatory Visit (INDEPENDENT_AMBULATORY_CARE_PROVIDER_SITE_OTHER): Payer: 59 | Admitting: Endocrinology

## 2018-07-21 ENCOUNTER — Other Ambulatory Visit: Payer: Self-pay

## 2018-07-21 DIAGNOSIS — E890 Postprocedural hypothyroidism: Secondary | ICD-10-CM

## 2018-07-21 MED ORDER — LEVOTHYROXINE SODIUM 75 MCG PO TABS: 75.0000 ug | ORAL_TABLET | Freq: Every day | ORAL | 3 refills | Status: DC

## 2018-07-21 NOTE — Telephone Encounter (Signed)
error 

## 2018-07-21 NOTE — Progress Notes (Addendum)
Patient ID: Stacy Bryan, female   DOB: Jan 28, 1956, 63 y.o.   MRN: [id number]                                                                                                                Today's office visit was provided via telemedicine using video technique Explained to the patient and the the limitations of evaluation and management by telemedicine and the availability of in person appointments.  The patient understood the limitations and agreed to proceed. Patient also understood that the telehealth visit is billable. . Location of the patient: Home . Location of the provider: Office Only the patient and myself were participating in the encounter     Chief complaint: Follow-up visit for thyroid   History of Present Illness:    Background history: About 2 years ago she had gone to see a physician because of problems with weight gain and fatigue that had been going on for a few months Also she was having some palpitations at that time but no sweating or heat intolerance or shakiness Not clear if she had a goiter in the initial exam and no records are available including baseline labs. However her TSH was low in 09/2011 She was told to have Hyperthyroidism and started on methimazole 5 mg daily, probably after her scan in 6/17 However she was not followed for too long and has been mostly followed by PCP with no change in her methimazole and only irregular labs and follow-up  RECENT history:  She had a toxic nodular goiter without Graves' disease diagnosed on initial evaluation. She was treated with radioactive iodine, given 29.7 mCi on 06/06/17  Subsequently she has had HYPOTHYROIDISM  She was having fatigue and low free T4 level when she was started on 50 mcg of levothyroxine in 07/2017 In 6/19 she was having fatigue consistently and her dose was increased to 75 mcg and the dose has been continued unchanged TSH was high in 8/19 from her not taking her medication  consistently  On her last visit in 10/19 she was having complaints of fatigue and somnolence, she was recommended sleep study but she has not done this She is now very consistent with taking her thyroid supplement in the mornings  She again complains of fatigue and sleepiness as before but no cold intolerance or weight change  However her TSH appears to be consistently normal    Wt Readings from Last 3 Encounters:  03/05/18 230 lb (104.3 kg)  01/19/18 227 lb 12.8 oz (103.3 kg)  12/09/17 228 lb 3.2 oz (103.5 kg)     Thyroid function tests as follows:      Lab Results  Component Value Date   FREET4 0.91 07/13/2018   FREET4 0.94 01/14/2018   FREET4 0.76 11/28/2017   T3FREE 3.0 08/04/2017   T3FREE 4.7 (H) 07/01/2017   T3FREE 3.3 03/12/2017   TSH 1.89 07/13/2018   TSH 2.36 01/14/2018   TSH 6.72 (H) 11/28/2017     Lab Results  Component Value Date  THYROTRECAB <1.10 11/28/2017   THYROTRECAB <0.50 03/12/2017    THYROID SCAN done in 7/18 showed uptake of 40.1% and multinodular goiter   Allergies as of 07/21/2018   No Known Allergies     Medication List       Accurate as of July 21, 2018  8:20 AM. Always use your most recent med list.        aspirin EC 81 MG tablet Take 81 mg by mouth daily.   calcium carbonate 420 MG Chew chewable tablet Commonly known as:  [place] by mouth.   cyclobenzaprine 5 MG tablet Commonly known as:  FLEXERIL Take 1 tablet (5 mg total) by mouth 3 (three) times daily.   fish oil-omega-3 fatty acids 1000 MG capsule Take 2 g by mouth daily.   hydrochlorothiazide 12.5 MG capsule Commonly known as:  MICROZIDE Take 1 capsule (12.5 mg total) by mouth daily. Need office visit for more refills   ibuprofen 800 MG tablet Commonly known as:  ADVIL,MOTRIN Take 1 tablet (800 mg total) by mouth every 12 (twelve) hours as needed.   levothyroxine 75 MCG tablet Commonly known as:  SYNTHROID, LEVOTHROID TAKE 1 TABLET BY MOUTH ONCE DAILY    magnesium oxide 400 MG tablet Commonly known as:  MAG-OX Take 400 mg by mouth daily.   meclizine 25 MG tablet Commonly known as:  ANTIVERT Take 1 tablet (25 mg total) by mouth every 6 (six) hours as needed for dizziness.   OVER THE COUNTER MEDICATION Chlor Tab, two tablets daily for allergies.   VITAMIN D-3 PO Take 400 Units by mouth daily.           Past Medical History:  Diagnosis Date  . Abnormal Pap smear   . Allergy   . Anemia   . GERD (gastroesophageal reflux disease)   . Hyperlipidemia   . Hypertension   . Thyroid disease   . Vertigo     Past Surgical History:  Procedure Laterality Date  . ABDOMINAL HYSTERECTOMY  1997   total  . FOOT SURGERY     both feet  20 yrs ago  . OVARY SURGERY  1980's   removal of R ovary (cyst)    Family History  Problem Relation Age of Onset  . Hypertension Mother   . Heart disease Father   . Asthma Father   . Hyperlipidemia Father   . Asthma Sister   . Breast cancer Sister   . Stomach cancer Sister   . Thyroid disease Sister        Surgery  . Heart disease Sister   . Diabetes Sister   . Gout Sister   . Diabetes Sister   . Ulcers Sister   . Cancer Maternal Grandmother   . Colon cancer Neg Hx   . Esophageal cancer Neg Hx   . Liver cancer Neg Hx   . Pancreatic cancer Neg Hx   . Rectal cancer Neg Hx     Social History:  reports that she quit smoking about 12 years ago. She has never used smokeless tobacco. She reports current alcohol use. She reports that she does not use drugs.  Allergies: No Known Allergies   Review of Systems   Her right eye has been prominent and she has had that evaluated from an ophthalmologist   Examination:   There were no vitals taken for this visit.        She looks well No prominence of her eyes or swelling    Assessment/Plan:  HYPOTHYROIDISM secondary to I-131 treatment for toxic nodular goiter  Her hypothyroidism is stable and has not progressed, currently requiring  only 75 mcg of levothyroxine She does complain of fatigue but this is likely to be from sleep disturbance Has no other symptoms of hypothyroid TSH is consistently normal and now below 2.0  On her previous exam her thyroid enlargement was not present  She will now continue 75 mcg of levothyroxine and follow-up annually  Elayne Snare 07/21/2018, 8:20 AM       Note: This office note was prepared with Dragon voice recognition system technology. Any transcriptional errors that result from this process are unintentional.

## 2018-08-20 ENCOUNTER — Encounter: Payer: 59 | Admitting: Family Medicine

## 2018-08-20 ENCOUNTER — Other Ambulatory Visit: Payer: Self-pay

## 2018-08-20 ENCOUNTER — Ambulatory Visit: Payer: 59 | Attending: Family Medicine | Admitting: Family Medicine

## 2018-08-20 ENCOUNTER — Encounter: Payer: Self-pay | Admitting: Family Medicine

## 2018-08-20 DIAGNOSIS — M545 Low back pain: Secondary | ICD-10-CM

## 2018-08-20 DIAGNOSIS — G8929 Other chronic pain: Secondary | ICD-10-CM

## 2018-08-20 DIAGNOSIS — R42 Dizziness and giddiness: Secondary | ICD-10-CM

## 2018-08-20 DIAGNOSIS — R2232 Localized swelling, mass and lump, left upper limb: Secondary | ICD-10-CM

## 2018-08-20 DIAGNOSIS — I10 Essential (primary) hypertension: Secondary | ICD-10-CM

## 2018-08-20 MED ORDER — CYCLOBENZAPRINE HCL 5 MG PO TABS: 5.0000 mg | ORAL_TABLET | Freq: Three times a day (TID) | ORAL | 2 refills | Status: DC | PRN

## 2018-08-20 MED ORDER — HYDROCHLOROTHIAZIDE 12.5 MG PO CAPS: 12.5000 mg | ORAL_CAPSULE | Freq: Every day | ORAL | 1 refills | Status: DC

## 2018-08-20 MED ORDER — MECLIZINE HCL 25 MG PO TABS: 25.0000 mg | ORAL_TABLET | Freq: Three times a day (TID) | ORAL | 1 refills | Status: DC | PRN

## 2018-08-20 MED ORDER — IBUPROFEN 800 MG PO TABS: 800.0000 mg | ORAL_TABLET | Freq: Two times a day (BID) | ORAL | 1 refills | Status: DC | PRN

## 2018-08-20 NOTE — Progress Notes (Signed)
Ensley Keelee Bryan is a 63 year old female with a history of hypertension, chronic low back pain, hypothyroidism and (secondary to radioactive treatment for multinodular goiter), chronic venous insufficiency, lymphedema Virtual Visit via Telephone Note  I connected with Stacy Bryan, on 08/20/2018 at 10:31 AM by telephone due to the COVID-19 pandemic and verified that I am speaking with the correct person using two identifiers.   Consent: I discussed the limitations, risks, security and privacy concerns of performing an evaluation and management service by telephone and the availability of in person appointments. I also discussed with the patient that there may be a patient responsible charge related to this service. The patient expressed understanding and agreed to proceed.   Location of Patient: Work  Biomedical scientist of Provider: Clinic  Persons participating in Telemedicine visit: Stacy Bryan - CMA Stacy Bryan - PCP     History of Present Illness: Stacy Bryan is a 63 year old female with a history of hypertension, chronic low back pain, hypothyroidism and (secondary to radioactive treatment for multinodular goiter), chronic venous insufficiency who is seen for a follow up visit today. She complains of worsening vertigo which has caused her to leave work on several occassions and she has been unable to drive herself home.Episode have become more frequent and occur about once per week and could last up to 2 days but at other times she might not have any in a week. Last episode occurred this morning at 4am and is unrelated to change of position with Meclizine providing no relief. She sometimes feels her 'head wouldn't just slow down'  She also has a hard tender lump on her L arm for the past 2-3 months with associated needle sticks in her arm,L elbow and sometimes left ankle.  She is doing well on her antihypertensive and denies adverse effects from her  medications.  Her back usually feels tight at the end of the day and she is having to use Flexeril often along with Ibuprofen.  Past Medical History:  Diagnosis Date  . Abnormal Pap smear   . Allergy   . Anemia   . GERD (gastroesophageal reflux disease)   . Hyperlipidemia   . Hypertension   . Thyroid disease   . Vertigo    No Known Allergies  Current Outpatient Medications on File Prior to Visit  Medication Sig Dispense Refill  . aspirin EC 81 MG tablet Take 81 mg by mouth daily.    . calcium carbonate (TITRALAC) 420 MG CHEW chewable tablet Chew by mouth.    . Cholecalciferol (VITAMIN D-3 PO) Take 400 Units by mouth daily.     . cyclobenzaprine (FLEXERIL) 5 MG tablet Take 1 tablet (5 mg total) by mouth 3 (three) times daily. (Patient taking differently: Take 5 mg by mouth 3 (three) times daily as needed for muscle spasms. ) 60 tablet 3  . fish oil-omega-3 fatty acids 1000 MG capsule Take 2 g by mouth daily.    . hydrochlorothiazide (MICROZIDE) 12.5 MG capsule Take 1 capsule (12.5 mg total) by mouth daily. Need office visit for more refills 30 capsule 3  . ibuprofen (ADVIL,MOTRIN) 800 MG tablet Take 1 tablet (800 mg total) by mouth every 12 (twelve) hours as needed. 60 tablet 1  . levothyroxine (SYNTHROID, LEVOTHROID) 75 MCG tablet Take 1 tablet (75 mcg total) by mouth daily. 90 tablet 3  . magnesium oxide (MAG-OX) 400 MG tablet Take 400 mg by mouth daily.     . meclizine (ANTIVERT) 25  MG tablet Take 1 tablet (25 mg total) by mouth every 6 (six) hours as needed for dizziness. 30 tablet 1  . OVER THE COUNTER MEDICATION Chlor Tab, two tablets daily for allergies.     Current Facility-Administered Medications on File Prior to Visit  Medication Dose Route Frequency Provider Last Rate Last Dose  . 0.9 %  sodium chloride infusion  500 mL Intravenous Once Armbruster, Carlota Raspberry, MD        Observations/Objective: Awake, alert, oriented x3 Not in acute distress   CMP Latest Ref Rng &  Units 03/05/2018 02/26/2017 01/23/2017  Glucose 70 - 99 mg/dL 107(H) 103(H) 122(H)  BUN 8 - 23 mg/dL 16 14 14   Creatinine 0.44 - 1.00 mg/dL 0.87 0.85 0.80  Sodium 135 - 145 mmol/L 141 143 140  Potassium 3.5 - 5.1 mmol/L 3.6 4.4 3.9  Chloride 98 - 111 mmol/L 108 104 106  CO2 22 - 32 mmol/L 24 25 25   Calcium 8.9 - 10.3 mg/dL 9.3 9.5 9.3  Total Protein 6.5 - 8.1 g/dL 7.7 6.9 -  Total Bilirubin 0.3 - 1.2 mg/dL 0.6 0.3 -  Alkaline Phos 38 - 126 U/L 82 103 -  AST 15 - 41 U/L 20 17 -  ALT 0 - 44 U/L 25 21 -    Lipid Panel     Component Value Date/Time   CHOL 200 (H) 02/26/2017 1113   TRIG 169 (H) 02/26/2017 1113   HDL 48 02/26/2017 1113   CHOLHDL 4.2 02/26/2017 1113   CHOLHDL 4.8 10/08/2013 1130   VLDL 35 10/08/2013 1130   LDLCALC 118 (H) 02/26/2017 1113    Assessment and Plan: 1. HTN (hypertension), benign Stable - hydrochlorothiazide (MICROZIDE) 12.5 MG capsule; Take 1 capsule (12.5 mg total) by mouth daily.  Dispense: 90 capsule; Refill: 1  2. Chronic midline low back pain without sciatica Controlled on Flexeril - cyclobenzaprine (FLEXERIL) 5 MG tablet; Take 1 tablet (5 mg total) by mouth 3 (three) times daily as needed for muscle spasms.  Dispense: 60 tablet; Refill: 2 - ibuprofen (ADVIL) 800 MG tablet; Take 1 tablet (800 mg total) by mouth every 12 (twelve) hours as needed.  Dispense: 60 tablet; Refill: 1  3. Vertigo Uncontrolled - meclizine (ANTIVERT) 25 MG tablet; Take 1 tablet (25 mg total) by mouth 3 (three) times daily as needed for dizziness.  Dispense: 60 tablet; Refill: 1 - PT vestibular rehab; Future  4. Arm mass,left She will need an in person visit to assess arm mass I will see her in 1 week  Follow Up Instructions: Return in about 1 week (around 08/27/2018).   Labs at next visit  I discussed the assessment and treatment plan with the patient. The patient was provided an opportunity to ask questions and all were answered. The patient agreed with the plan  and demonstrated an understanding of the instructions.   The patient was advised to call back or seek an in-person evaluation if the symptoms worsen or if the condition fails to improve as anticipated.     I provided 15 minutes total of non-face-to-face time during this encounter including median intraservice time, reviewing previous notes, labs, imaging, medications and explaining diagnosis and management.     Charlott Rakes, MD, FAAFP. [facility] and [place], [place]   08/20/2018, 10:31 AM

## 2018-08-20 NOTE — Progress Notes (Signed)
Patient has been called and DOB has been verified. Patient has been screened and transferred to PCP to start phone visit.     

## 2018-08-27 ENCOUNTER — Other Ambulatory Visit: Payer: Self-pay

## 2018-08-27 ENCOUNTER — Ambulatory Visit: Payer: 59 | Attending: Family Medicine | Admitting: Family Medicine

## 2018-08-27 ENCOUNTER — Encounter: Payer: Self-pay | Admitting: Family Medicine

## 2018-08-27 VITALS — BP 138/83 | HR 61 | Temp 98.3°F | Ht 70.0 in | Wt 225.2 lb

## 2018-08-27 DIAGNOSIS — L729 Follicular cyst of the skin and subcutaneous tissue, unspecified: Secondary | ICD-10-CM

## 2018-08-27 DIAGNOSIS — Z13228 Encounter for screening for other metabolic disorders: Secondary | ICD-10-CM | POA: Diagnosis not present

## 2018-08-27 DIAGNOSIS — E890 Postprocedural hypothyroidism: Secondary | ICD-10-CM

## 2018-08-27 DIAGNOSIS — R42 Dizziness and giddiness: Secondary | ICD-10-CM | POA: Diagnosis not present

## 2018-08-27 NOTE — Progress Notes (Signed)
Patient has mass on left arm.

## 2018-08-27 NOTE — Progress Notes (Signed)
Subjective:  Patient ID: Stacy Bryan, female    DOB: 1955/06/21  Age: 63 y.o. MRN: [id number]  CC: Mass (arm)   HPI Stacy Bryan is a 62 year old female with a history of hypertension, chronic low back pain, hypothyroidism and (secondary to radioactive treatment for multinodular goiter), chronic venous insufficiency who is seen for a follow up visit today.  At her last office visit which was a telehealth visit she had complained of vertigo which she had described as 'worsening vertigo which has caused her to leave work on several occassions and she has been unable to drive herself home.Episode have become more frequent and occur about once per week and could last up to 2 days but at other times she might not have any in a week'. I had referred her for vestibular rehab but this never happened.  I will refer her again.  She also has an FMLA paperwork to this effect which she will need completed.  I had brought her in for an office visit because she had complained of a left underarm mass which has been present for 2 to 3 months and slightly tender, not increasing in size.  Past Medical History:  Diagnosis Date  . Abnormal Pap smear   . Allergy   . Anemia   . GERD (gastroesophageal reflux disease)   . Hyperlipidemia   . Hypertension   . Thyroid disease   . Vertigo     Past Surgical History:  Procedure Laterality Date  . ABDOMINAL HYSTERECTOMY  1997   total  . FOOT SURGERY     both feet  20 yrs ago  . OVARY SURGERY  1980's   removal of R ovary (cyst)    Family History  Problem Relation Age of Onset  . Hypertension Mother   . Heart disease Father   . Asthma Father   . Hyperlipidemia Father   . Asthma Sister   . Breast cancer Sister   . Stomach cancer Sister   . Thyroid disease Sister        Surgery  . Heart disease Sister   . Diabetes Sister   . Gout Sister   . Diabetes Sister   . Ulcers Sister   . Cancer Maternal Grandmother   . Colon cancer Neg Hx   .  Esophageal cancer Neg Hx   . Liver cancer Neg Hx   . Pancreatic cancer Neg Hx   . Rectal cancer Neg Hx     No Known Allergies  Outpatient Medications Prior to Visit  Medication Sig Dispense Refill  . aspirin EC 81 MG tablet Take 81 mg by mouth daily.    . calcium carbonate (TITRALAC) 420 MG CHEW chewable tablet Chew by mouth.    . Cholecalciferol (VITAMIN D-3 PO) Take 400 Units by mouth daily.     . cyclobenzaprine (FLEXERIL) 5 MG tablet Take 1 tablet (5 mg total) by mouth 3 (three) times daily as needed for muscle spasms. 60 tablet 2  . fish oil-omega-3 fatty acids 1000 MG capsule Take 2 g by mouth daily.    . hydrochlorothiazide (MICROZIDE) 12.5 MG capsule Take 1 capsule (12.5 mg total) by mouth daily. 90 capsule 1  . ibuprofen (ADVIL) 800 MG tablet Take 1 tablet (800 mg total) by mouth every 12 (twelve) hours as needed. 60 tablet 1  . levothyroxine (SYNTHROID, LEVOTHROID) 75 MCG tablet Take 1 tablet (75 mcg total) by mouth daily. 90 tablet 3  . magnesium oxide (MAG-OX) 400 MG tablet  Take 400 mg by mouth daily.     . meclizine (ANTIVERT) 25 MG tablet Take 1 tablet (25 mg total) by mouth 3 (three) times daily as needed for dizziness. 60 tablet 1  . OVER THE COUNTER MEDICATION Chlor Tab, two tablets daily for allergies.     Facility-Administered Medications Prior to Visit  Medication Dose Route Frequency Provider Last Rate Last Dose  . 0.9 %  sodium chloride infusion  500 mL Intravenous Once Armbruster, Carlota Raspberry, MD         ROS Review of Systems  Constitutional: Negative for activity change, appetite change and fatigue.  HENT: Negative for congestion, sinus pressure and sore throat.   Eyes: Negative for visual disturbance.  Respiratory: Negative for cough, chest tightness, shortness of breath and wheezing.   Cardiovascular: Negative for chest pain and palpitations.  Gastrointestinal: Negative for abdominal distention, abdominal pain and constipation.  Endocrine: Negative for  polydipsia.  Genitourinary: Negative for dysuria and frequency.  Musculoskeletal: Negative for arthralgias and back pain.  Skin:       See HPI  Neurological: Negative for tremors, light-headedness and numbness.  Hematological: Does not bruise/bleed easily.  Psychiatric/Behavioral: Negative for agitation and behavioral problems.    Objective:  BP 138/83   Pulse 61   Temp 98.3 F (36.8 C) (Oral)   Ht '5\' 10"'  (1.778 m)   Wt 225 lb 3.2 oz (102.2 kg)   SpO2 94%   BMI 32.31 kg/m   BP/Weight 08/27/2018 03/05/2018 62/09/9483  Systolic BP 462 703 500  Diastolic BP 83 90 78  Wt. (Lbs) 225.2 230 227.8  BMI 32.31 33 32.69      Physical Exam Constitutional:      Appearance: She is well-developed.  Cardiovascular:     Rate and Rhythm: Normal rate.     Heart sounds: Normal heart sounds. No murmur.  Pulmonary:     Effort: Pulmonary effort is normal.     Breath sounds: Normal breath sounds. No wheezing or rales.  Chest:     Chest wall: No tenderness.  Abdominal:     General: Bowel sounds are normal. There is no distension.     Palpations: Abdomen is soft. There is no mass.     Tenderness: There is no abdominal tenderness.  Musculoskeletal: Normal range of motion.  Skin:    Comments: Left underarm 0.5 cm in diameter mobile soft lesion which is slightly tender on deep palpation  Neurological:     Mental Status: She is alert and oriented to person, place, and time.     CMP Latest Ref Rng & Units 03/05/2018 02/26/2017 01/23/2017  Glucose 70 - 99 mg/dL 107(H) 103(H) 122(H)  BUN 8 - 23 mg/dL '16 14 14  ' Creatinine 0.44 - 1.00 mg/dL 0.87 0.85 0.80  Sodium 135 - 145 mmol/L 141 143 140  Potassium 3.5 - 5.1 mmol/L 3.6 4.4 3.9  Chloride 98 - 111 mmol/L 108 104 106  CO2 22 - 32 mmol/L '24 25 25  ' Calcium 8.9 - 10.3 mg/dL 9.3 9.5 9.3  Total Protein 6.5 - 8.1 g/dL 7.7 6.9 -  Total Bilirubin 0.3 - 1.2 mg/dL 0.6 0.3 -  Alkaline Phos 38 - 126 U/L 82 103 -  AST 15 - 41 U/L 20 17 -  ALT 0 - 44  U/L 25 21 -    Lipid Panel     Component Value Date/Time   CHOL 200 (H) 02/26/2017 1113   TRIG 169 (H) 02/26/2017 1113   HDL 48  02/26/2017 1113   CHOLHDL 4.2 02/26/2017 1113   CHOLHDL 4.8 10/08/2013 1130   VLDL 35 10/08/2013 1130   LDLCALC 118 (H) 02/26/2017 1113    CBC    Component Value Date/Time   WBC 5.0 03/05/2018 1424   RBC 5.14 (H) 03/05/2018 1424   HGB 14.2 03/05/2018 1424   HGB 12.6 02/26/2017 1113   HCT 44.3 03/05/2018 1424   HCT 38.0 02/26/2017 1113   PLT 249 03/05/2018 1424   PLT 247 02/26/2017 1113   MCV 86.2 03/05/2018 1424   MCV 83 02/26/2017 1113   MCH 27.6 03/05/2018 1424   MCHC 32.1 03/05/2018 1424   RDW 13.2 03/05/2018 1424   RDW 14.4 02/26/2017 1113   LYMPHSABS 1.5 03/05/2018 1424   LYMPHSABS 1.8 02/26/2017 1113   MONOABS 0.4 03/05/2018 1424   EOSABS 0.2 03/05/2018 1424   EOSABS 0.2 02/26/2017 1113   BASOSABS 0.0 03/05/2018 1424   BASOSABS 0.0 02/26/2017 1113    Lab Results  Component Value Date   HGBA1C 6.2 02/26/2017    Assessment & Plan:   1. Hypothyroidism, postablative Controlled Continue levothyroxine - T4, free - TSH  2. Vertigo Uncontrolled on meclizine We will place another referral to PT for rehab I will complete her FMLA paperwork - Ambulatory referral to Physical Therapy  3. Subcutaneous cyst Reassurance provided at this time Patient to notify me if cyst increases in size  4. Screening for metabolic disorder - [id number] - Lipid panel   No orders of the defined types were placed in this encounter.   Follow-up: Return in about 6 months (around 02/27/2019) for medical conditions.       Charlott Rakes, MD, FAAFP. [facility] and [facility], [place]   08/27/2018, 11:09 AM

## 2018-08-28 ENCOUNTER — Other Ambulatory Visit: Payer: Self-pay | Admitting: Family Medicine

## 2018-08-28 LAB — CMP14+EGFR
ALT: 25 IU/L (ref 0–32)
AST: 18 IU/L (ref 0–40)
Albumin/Globulin Ratio: 1.4 (ref 1.2–2.2)
Albumin: 4.3 g/dL (ref 3.8–4.8)
Alkaline Phosphatase: 104 IU/L (ref 39–117)
BUN/Creatinine Ratio: 15 (ref 12–28)
BUN: 14 mg/dL (ref 8–27)
Bilirubin Total: 0.2 mg/dL (ref 0.0–1.2)
CO2: 21 mmol/L (ref 20–29)
Calcium: 9.6 mg/dL (ref 8.7–10.3)
Chloride: 102 mmol/L (ref 96–106)
Creatinine, Ser: 0.91 mg/dL (ref 0.57–1.00)
GFR calc Af Amer: 77 mL/min/{1.73_m2} (ref >59)
GFR calc non Af Amer: 66 mL/min/{1.73_m2} (ref >59)
Globulin, Total: 3 g/dL (ref 1.5–4.5)
Glucose: 113 mg/dL — ABNORMAL HIGH (ref 65–99)
Potassium: 4.2 mmol/L (ref 3.5–5.2)
Sodium: 141 mmol/L (ref 134–144)
Total Protein: 7.3 g/dL (ref 6.0–8.5)

## 2018-08-28 LAB — LIPID PANEL
Chol/HDL Ratio: 5.1 ratio — ABNORMAL HIGH (ref 0.0–4.4)
Cholesterol, Total: 266 mg/dL — ABNORMAL HIGH (ref 100–199)
HDL: 52 mg/dL (ref >39)
LDL Calculated: 182 mg/dL — ABNORMAL HIGH (ref 0–99)
Triglycerides: 160 mg/dL — ABNORMAL HIGH (ref 0–149)
VLDL Cholesterol Cal: 32 mg/dL (ref 5–40)

## 2018-08-28 LAB — TSH: TSH: 1.56 u[IU]/mL (ref 0.450–4.500)

## 2018-08-28 LAB — T4, FREE: Free T4: 1.09 ng/dL (ref 0.82–1.77)

## 2018-08-28 MED ORDER — ATORVASTATIN CALCIUM 20 MG PO TABS: 20.0000 mg | ORAL_TABLET | Freq: Every day | ORAL | 3 refills | Status: DC

## 2018-09-03 ENCOUNTER — Telehealth: Payer: Self-pay

## 2018-09-03 NOTE — Telephone Encounter (Signed)
-----   Message from Charlott Rakes, MD sent at 08/28/2018 10:24 AM EDT ----- Cholesterol is elevated. I have sent a prescription for Lipitor to her Pharmacy. Glucose is slightly elevated. Please advise to work on a low cholesterol diet and exercise. Thyroid is normal.

## 2018-09-03 NOTE — Telephone Encounter (Signed)
Patient name and DOB has been verified Patient was informed of lab results. Patient had no questions.  

## 2019-01-11 ENCOUNTER — Ambulatory Visit: Payer: 59 | Attending: Family Medicine | Admitting: Family Medicine

## 2019-01-11 ENCOUNTER — Other Ambulatory Visit: Payer: Self-pay

## 2019-01-11 ENCOUNTER — Encounter: Payer: Self-pay | Admitting: Family Medicine

## 2019-01-11 VITALS — BP 139/76 | HR 60 | Temp 98.0°F | Ht 70.0 in | Wt 221.6 lb

## 2019-01-11 DIAGNOSIS — Z23 Encounter for immunization: Secondary | ICD-10-CM

## 2019-01-11 DIAGNOSIS — E2839 Other primary ovarian failure: Secondary | ICD-10-CM

## 2019-01-11 DIAGNOSIS — G8929 Other chronic pain: Secondary | ICD-10-CM | POA: Diagnosis not present

## 2019-01-11 DIAGNOSIS — R42 Dizziness and giddiness: Secondary | ICD-10-CM | POA: Diagnosis not present

## 2019-01-11 DIAGNOSIS — Z0000 Encounter for general adult medical examination without abnormal findings: Secondary | ICD-10-CM

## 2019-01-11 DIAGNOSIS — M545 Low back pain: Secondary | ICD-10-CM | POA: Diagnosis not present

## 2019-01-11 DIAGNOSIS — Z0001 Encounter for general adult medical examination with abnormal findings: Secondary | ICD-10-CM | POA: Diagnosis not present

## 2019-01-11 DIAGNOSIS — Z1239 Encounter for other screening for malignant neoplasm of breast: Secondary | ICD-10-CM

## 2019-01-11 MED ORDER — MECLIZINE HCL 25 MG PO TABS: 25.0000 mg | ORAL_TABLET | Freq: Three times a day (TID) | ORAL | 1 refills | Status: DC | PRN

## 2019-01-11 MED ORDER — IBUPROFEN 800 MG PO TABS: 800.0000 mg | ORAL_TABLET | Freq: Two times a day (BID) | ORAL | 1 refills | Status: DC | PRN

## 2019-01-11 MED ORDER — CYCLOBENZAPRINE HCL 5 MG PO TABS: 5.0000 mg | ORAL_TABLET | Freq: Three times a day (TID) | ORAL | 2 refills | Status: DC | PRN

## 2019-01-11 NOTE — Progress Notes (Signed)
Patient is having lower back pain.  Patient states she has more vertigo spells than usual.

## 2019-01-11 NOTE — Progress Notes (Signed)
Subjective:  Patient ID: Stacy Bryan, female    DOB: 1955/06/03  Age: 63 y.o. MRN: [id number]  CC: Annual Exam   HPI Stacy Bryan presents for a complete physical exam. Last mammogram was in 11/2016 and was negative for malignancy She does not need a Pap smear as she is over 63.  She continues to experience vertigo which she had complained of her last visit 3 months ago and I had referred her for vestibular rehab however she never got an appointment from PT.  This has been severe to the point that she is lightheaded every morning and this causes her to go in late for work as she has to take her time.  Meclizine has been ineffective.  Turning to the left sets of her vertigo. She also has right-sided low back pain which tightens up when she walks, especially after she does groceries and gets to a 9/10 but this does not radiate.  Past Medical History:  Diagnosis Date  . Abnormal Pap smear   . Allergy   . Anemia   . GERD (gastroesophageal reflux disease)   . Hyperlipidemia   . Hypertension   . Thyroid disease   . Vertigo     Past Surgical History:  Procedure Laterality Date  . ABDOMINAL HYSTERECTOMY  1997   total  . FOOT SURGERY     both feet  20 yrs ago  . OVARY SURGERY  1980's   removal of R ovary (cyst)    Family History  Problem Relation Age of Onset  . Hypertension Mother   . Heart disease Father   . Asthma Father   . Hyperlipidemia Father   . Asthma Sister   . Breast cancer Sister   . Stomach cancer Sister   . Thyroid disease Sister        Surgery  . Heart disease Sister   . Diabetes Sister   . Gout Sister   . Diabetes Sister   . Ulcers Sister   . Cancer Maternal Grandmother   . Colon cancer Neg Hx   . Esophageal cancer Neg Hx   . Liver cancer Neg Hx   . Pancreatic cancer Neg Hx   . Rectal cancer Neg Hx     No Known Allergies  Outpatient Medications Prior to Visit  Medication Sig Dispense Refill  . aspirin EC 81 MG tablet Take 81 mg by  mouth daily.    Marland Kitchen atorvastatin (LIPITOR) 20 MG tablet Take 1 tablet (20 mg total) by mouth daily. 30 tablet 3  . calcium carbonate (TITRALAC) 420 MG CHEW chewable tablet Chew by mouth.    . Cholecalciferol (VITAMIN D-3 PO) Take 400 Units by mouth daily.     . fish oil-omega-3 fatty acids 1000 MG capsule Take 2 g by mouth daily.    . hydrochlorothiazide (MICROZIDE) 12.5 MG capsule Take 1 capsule (12.5 mg total) by mouth daily. 90 capsule 1  . levothyroxine (SYNTHROID, LEVOTHROID) 75 MCG tablet Take 1 tablet (75 mcg total) by mouth daily. 90 tablet 3  . magnesium oxide (MAG-OX) 400 MG tablet Take 400 mg by mouth daily.     Marland Kitchen OVER THE COUNTER MEDICATION Chlor Tab, two tablets daily for allergies.    . cyclobenzaprine (FLEXERIL) 5 MG tablet Take 1 tablet (5 mg total) by mouth 3 (three) times daily as needed for muscle spasms. 60 tablet 2  . ibuprofen (ADVIL) 800 MG tablet Take 1 tablet (800 mg total) by mouth every 12 (twelve) hours  as needed. 60 tablet 1  . meclizine (ANTIVERT) 25 MG tablet Take 1 tablet (25 mg total) by mouth 3 (three) times daily as needed for dizziness. 60 tablet 1   Facility-Administered Medications Prior to Visit  Medication Dose Route Frequency Provider Last Rate Last Dose  . 0.9 %  sodium chloride infusion  500 mL Intravenous Once Armbruster, Carlota Raspberry, MD         ROS Review of Systems  Constitutional: Negative for activity change, appetite change and fatigue.  HENT: Negative for congestion, sinus pressure and sore throat.   Eyes: Negative for visual disturbance.  Respiratory: Negative for cough, chest tightness, shortness of breath and wheezing.   Cardiovascular: Negative for chest pain and palpitations.  Gastrointestinal: Negative for abdominal distention, abdominal pain and constipation.  Endocrine: Negative for polydipsia.  Genitourinary: Negative for dysuria and frequency.  Musculoskeletal: Positive for back pain.  Skin: Negative for rash.  Neurological:  Positive for light-headedness. Negative for tremors and numbness.  Hematological: Does not bruise/bleed easily.  Psychiatric/Behavioral: Negative for agitation and behavioral problems.    Objective:  BP 139/76   Pulse 60   Temp 98 F (36.7 C) (Oral)   Ht 5\' 10"  (1.778 m)   Wt 221 lb 9.6 oz (100.5 kg)   SpO2 98%   BMI 31.80 kg/m   BP/Weight 01/11/2019 08/27/2018 [id number]  Systolic BP [id number]  Diastolic BP 76 83 90  Wt. (Lbs) 221.6 225.2 230  BMI 31.8 32.31 33      Physical Exam Constitutional:      General: She is not in acute distress.    Appearance: She is well-developed. She is not diaphoretic.  HENT:     Head: Normocephalic.     Right Ear: External ear normal.     Left Ear: External ear normal.     Nose: Nose normal.  Eyes:     Conjunctiva/sclera: Conjunctivae normal.     Pupils: Pupils are equal, round, and reactive to light.  Neck:     Musculoskeletal: Normal range of motion.     Vascular: No JVD.  Cardiovascular:     Rate and Rhythm: Normal rate and regular rhythm.     Heart sounds: Normal heart sounds. No murmur. No gallop.   Pulmonary:     Effort: Pulmonary effort is normal. No respiratory distress.     Breath sounds: Normal breath sounds. No wheezing or rales.  Chest:     Chest wall: No tenderness.  Abdominal:     General: Bowel sounds are normal. There is no distension.     Palpations: Abdomen is soft. There is no mass.     Tenderness: There is no abdominal tenderness.  Musculoskeletal: Normal range of motion.        General: No tenderness.     Comments: Negative straight leg raise No tenderness on palpation of entire back  Skin:    General: Skin is warm and dry.  Neurological:     Mental Status: She is alert and oriented to person, place, and time.     Deep Tendon Reflexes: Reflexes are normal and symmetric.  Psychiatric:        Mood and Affect: Mood normal.     CMP Latest Ref Rng & Units 08/27/2018 03/05/2018 02/26/2017  Glucose 65 -  99 mg/dL 113(H) 107(H) 103(H)  BUN 8 - 27 mg/dL 14 16 14   Creatinine 0.57 - 1.00 mg/dL 0.91 0.87 0.85  Sodium 134 - 144 mmol/L 141 141 143  Potassium 3.5 - 5.2 mmol/L 4.2 3.6 4.4  Chloride 96 - 106 mmol/L 102 108 104  CO2 20 - 29 mmol/L 21 24 25   Calcium 8.7 - 10.3 mg/dL 9.6 9.3 9.5  Total Protein 6.0 - 8.5 g/dL 7.3 7.7 6.9  Total Bilirubin 0.0 - 1.2 mg/dL 0.2 0.6 0.3  Alkaline Phos 39 - 117 IU/L 104 82 103  AST 0 - 40 IU/L 18 20 17   ALT 0 - 32 IU/L 25 25 21     Lipid Panel     Component Value Date/Time   CHOL 266 (H) 08/27/2018 1016   TRIG 160 (H) 08/27/2018 1016   HDL 52 08/27/2018 1016   CHOLHDL 5.1 (H) 08/27/2018 1016   CHOLHDL 4.8 10/08/2013 1130   VLDL 35 10/08/2013 1130   LDLCALC 182 (H) 08/27/2018 1016    CBC    Component Value Date/Time   WBC 5.0 03/05/2018 1424   RBC 5.14 (H) 03/05/2018 1424   HGB 14.2 03/05/2018 1424   HGB 12.6 02/26/2017 1113   HCT 44.3 03/05/2018 1424   HCT 38.0 02/26/2017 1113   PLT 249 03/05/2018 1424   PLT 247 02/26/2017 1113   MCV 86.2 03/05/2018 1424   MCV 83 02/26/2017 1113   MCH 27.6 03/05/2018 1424   MCHC 32.1 03/05/2018 1424   RDW 13.2 03/05/2018 1424   RDW 14.4 02/26/2017 1113   LYMPHSABS 1.5 03/05/2018 1424   LYMPHSABS 1.8 02/26/2017 1113   MONOABS 0.4 03/05/2018 1424   EOSABS 0.2 03/05/2018 1424   EOSABS 0.2 02/26/2017 1113   BASOSABS 0.0 03/05/2018 1424   BASOSABS 0.0 02/26/2017 1113    Lab Results  Component Value Date   HGBA1C 6.2 02/26/2017    Assessment & Plan:   1. Annual physical exam Counseled on 150 minutes of exercise per week, healthy eating (including decreased daily intake of saturated fats, cholesterol, added sugars, sodium), STI prevention, routine healthcare maintenance.   2. Estrogen deficiency - DG Bone Density; Future  3. Screening for breast cancer - MM Digital Screening; Future  4. Chronic midline low back pain without sciatica Likely muscle spasm Advised to apply heat -  cyclobenzaprine (FLEXERIL) 5 MG tablet; Take 1 tablet (5 mg total) by mouth 3 (three) times daily as needed for muscle spasms.  Dispense: 60 tablet; Refill: 2 - ibuprofen (ADVIL) 800 MG tablet; Take 1 tablet (800 mg total) by mouth every 12 (twelve) hours as needed.  Dispense: 60 tablet; Refill: 1 - Ambulatory referral to Physical Therapy  5. Vertigo Uncontrolled I will send a message to the referral coordinator to coordinate her PT referral for vestibular rehab - Ambulatory referral to ENT - meclizine (ANTIVERT) 25 MG tablet; Take 1 tablet (25 mg total) by mouth 3 (three) times daily as needed for dizziness.  Dispense: 60 tablet; Refill: 1    Meds ordered this encounter  Medications  . meclizine (ANTIVERT) 25 MG tablet    Sig: Take 1 tablet (25 mg total) by mouth 3 (three) times daily as needed for dizziness.    Dispense:  60 tablet    Refill:  1  . cyclobenzaprine (FLEXERIL) 5 MG tablet    Sig: Take 1 tablet (5 mg total) by mouth 3 (three) times daily as needed for muscle spasms.    Dispense:  60 tablet    Refill:  2  . ibuprofen (ADVIL) 800 MG tablet    Sig: Take 1 tablet (800 mg total) by mouth every 12 (twelve) hours as needed.  Dispense:  60 tablet    Refill:  1    Please consider 90 day supplies to promote better adherence    Follow-up: Return in about 3 months (around 04/12/2019) for medical conditions.       Charlott Rakes, MD, FAAFP. [facility] and [place], [place]   01/11/2019, 11:35 AM

## 2019-01-12 ENCOUNTER — Telehealth: Payer: Self-pay | Admitting: Family Medicine

## 2019-01-12 DIAGNOSIS — R42 Dizziness and giddiness: Secondary | ICD-10-CM

## 2019-01-12 NOTE — Telephone Encounter (Signed)
I contacted  [facility] and the message was sent to [facility] . Waiting for appointment .

## 2019-01-12 NOTE — Telephone Encounter (Signed)
Thank you :)

## 2019-01-12 NOTE — Telephone Encounter (Signed)
Can you please reach out to rehab regarding this patient?  I referred her to PT for vestibular rehab in 08/2018 and she is yet to hear from them.  Thank you

## 2019-01-28 ENCOUNTER — Telehealth: Payer: Self-pay | Admitting: Family Medicine

## 2019-01-28 NOTE — Telephone Encounter (Signed)
Pt called to in regards to her sets of Vertigo, she was told to cal back if when this occurred again. Please follow up, phone has been verified

## 2019-02-08 NOTE — Telephone Encounter (Signed)
I called patient regarding her PT Referral she said that she need to call them to schedule a new appointment .  Also  She called before about  Vertigo, she was told to cal back if when this occurred again. Please follow up, phone has been verified.

## 2019-03-29 ENCOUNTER — Ambulatory Visit
Admission: RE | Admit: 2019-03-29 | Discharge: 2019-03-29 | Disposition: A | Discharge status: Home / Self Care | Payer: 59 | Source: Ambulatory Visit | Attending: Family Medicine | Admitting: Family Medicine

## 2019-03-29 ENCOUNTER — Other Ambulatory Visit: Payer: Self-pay

## 2019-03-29 DIAGNOSIS — E2839 Other primary ovarian failure: Secondary | ICD-10-CM

## 2019-03-29 DIAGNOSIS — Z1239 Encounter for other screening for malignant neoplasm of breast: Secondary | ICD-10-CM

## 2019-03-29 MED ORDER — LEVOTHYROXINE SODIUM 75 MCG PO TABS: 75.0000 ug | ORAL_TABLET | Freq: Every day | ORAL | 2 refills | Status: DC

## 2019-04-02 NOTE — Telephone Encounter (Signed)
Dr Margarita Rana  They been trying to contact patient to schedule an appointment and her referral is expired  . Thank you .

## 2019-04-05 NOTE — Telephone Encounter (Signed)
I have placed a new referral. Do you mind informing this patient that rehab had been previously unable to contact her? You  might want to give her their number as well so she can follow up in the event they are unable to reach her again. Thanks

## 2019-04-05 NOTE — Addendum Note (Signed)
Addended by: Charlott Rakes on: 04/05/2019 08:17 AM   Modules accepted: Orders

## 2019-04-13 ENCOUNTER — Other Ambulatory Visit: Payer: Self-pay

## 2019-04-13 ENCOUNTER — Ambulatory Visit: Payer: 59 | Attending: Family Medicine | Admitting: Family Medicine

## 2019-04-13 DIAGNOSIS — E7800 Pure hypercholesterolemia, unspecified: Secondary | ICD-10-CM

## 2019-04-13 DIAGNOSIS — G8929 Other chronic pain: Secondary | ICD-10-CM

## 2019-04-13 DIAGNOSIS — R42 Dizziness and giddiness: Secondary | ICD-10-CM

## 2019-04-13 DIAGNOSIS — M545 Low back pain: Secondary | ICD-10-CM

## 2019-04-13 DIAGNOSIS — M7541 Impingement syndrome of right shoulder: Secondary | ICD-10-CM

## 2019-04-13 DIAGNOSIS — I10 Essential (primary) hypertension: Secondary | ICD-10-CM | POA: Diagnosis not present

## 2019-04-13 MED ORDER — HYDROCHLOROTHIAZIDE 12.5 MG PO CAPS: 12.5000 mg | ORAL_CAPSULE | Freq: Every day | ORAL | 1 refills | Status: DC

## 2019-04-13 MED ORDER — MELOXICAM 7.5 MG PO TABS: 7.5000 mg | ORAL_TABLET | Freq: Every day | ORAL | 1 refills | Status: DC

## 2019-04-13 MED ORDER — ATORVASTATIN CALCIUM 20 MG PO TABS: 20.0000 mg | ORAL_TABLET | Freq: Every day | ORAL | 1 refills | Status: DC

## 2019-04-13 MED ORDER — CYCLOBENZAPRINE HCL 10 MG PO TABS: 10.0000 mg | ORAL_TABLET | Freq: Two times a day (BID) | ORAL | 1 refills | Status: DC | PRN

## 2019-04-13 MED ORDER — MECLIZINE HCL 25 MG PO TABS: 25.0000 mg | ORAL_TABLET | Freq: Three times a day (TID) | ORAL | 1 refills | Status: DC | PRN

## 2019-04-13 NOTE — Progress Notes (Signed)
Virtual Visit via Telephone Note  I connected with Stacy Bryan, on 04/13/2019 at 9:07 AM by telephone due to the COVID-19 pandemic and verified that I am speaking with the correct person using two identifiers.   Consent: I discussed the limitations, risks, security and privacy concerns of performing an evaluation and management service by telephone and the availability of in person appointments. I also discussed with the patient that there may be a patient responsible charge related to this service. The patient expressed understanding and agreed to proceed.   Location of Patient: Home  Location of Provider: Clinic   Persons participating in Telemedicine visit: Gizell Fonnie Casado [place]-CMA Dr. Margarita Rana     History of Present Illness: Stacy Bryan is a 63 year old female with a history of hypertension, chronic low back pain, hypothyroidism (secondary to radioactive treatment for multinodular goiter), chronic venous insufficiency, vertigo who is seen for a follow up visit today.    After her vestibular rehab procedure it set off her vertigo and she bad experience as she could not drive. She has not been back to Rehab but has had repeat episodes of vertigo and remains on meclizine; she plans on following up again after her son comes into town as he would be able to drive her and watch her at home.  For the last 4 weeks her R shoulder has been hurting and it keeps her up at night; she initially thought she slept wrong as this extended down her R upper extremity to her R hand and thumb with difficulty moving the R thumb. Her arm feels heavy but is not numb. She has had numbness is her chin but no slurred speech. It is a 7/10. Heating pad helps some. Compliant with her statin and her antihypertensive. Hypothyroidism is managed by endocrine.  Past Medical History:  Diagnosis Date  . Abnormal Pap smear   . Allergy   . Anemia   . GERD (gastroesophageal reflux  disease)   . Hyperlipidemia   . Hypertension   . Thyroid disease   . Vertigo    No Known Allergies  Current Outpatient Medications on File Prior to Visit  Medication Sig Dispense Refill  . aspirin EC 81 MG tablet Take 81 mg by mouth daily.    Marland Kitchen atorvastatin (LIPITOR) 20 MG tablet Take 1 tablet (20 mg total) by mouth daily. 30 tablet 3  . calcium carbonate (TITRALAC) 420 MG CHEW chewable tablet Chew by mouth.    . Cholecalciferol (VITAMIN D-3 PO) Take 400 Units by mouth daily.     . cyclobenzaprine (FLEXERIL) 5 MG tablet Take 1 tablet (5 mg total) by mouth 3 (three) times daily as needed for muscle spasms. 60 tablet 2  . fish oil-omega-3 fatty acids 1000 MG capsule Take 2 g by mouth daily.    . hydrochlorothiazide (MICROZIDE) 12.5 MG capsule Take 1 capsule (12.5 mg total) by mouth daily. 90 capsule 1  . ibuprofen (ADVIL) 800 MG tablet Take 1 tablet (800 mg total) by mouth every 12 (twelve) hours as needed. 60 tablet 1  . levothyroxine (EUTHYROX) 75 MCG tablet Take 1 tablet (75 mcg total) by mouth daily before breakfast. 30 tablet 2  . magnesium oxide (MAG-OX) 400 MG tablet Take 400 mg by mouth daily.     . meclizine (ANTIVERT) 25 MG tablet Take 1 tablet (25 mg total) by mouth 3 (three) times daily as needed for dizziness. 60 tablet 1  . OVER THE COUNTER MEDICATION Chlor Tab, two tablets  daily for allergies.     Current Facility-Administered Medications on File Prior to Visit  Medication Dose Route Frequency Provider Last Rate Last Admin  . 0.9 %  sodium chloride infusion  500 mL Intravenous Once Yetta Flock, MD        Observations/Objective: Awake, alert, oriented x3 Not in acute distress  CMP Latest Ref Rng & Units 08/27/2018 03/05/2018 02/26/2017  Glucose 65 - 99 mg/dL 113(H) 107(H) 103(H)  BUN 8 - 27 mg/dL 14 16 14   Creatinine 0.57 - 1.00 mg/dL 0.91 0.87 0.85  Sodium 134 - 144 mmol/L 141 141 143  Potassium 3.5 - 5.2 mmol/L 4.2 3.6 4.4  Chloride 96 - 106 mmol/L 102 108  104  CO2 20 - 29 mmol/L 21 24 25   Calcium 8.7 - 10.3 mg/dL 9.6 9.3 9.5  Total Protein 6.0 - 8.5 g/dL 7.3 7.7 6.9  Total Bilirubin 0.0 - 1.2 mg/dL 0.2 0.6 0.3  Alkaline Phos 39 - 117 IU/L 104 82 103  AST 0 - 40 IU/L 18 20 17   ALT 0 - 32 IU/L 25 25 21     Lab Results  Component Value Date   TSH 1.560 08/27/2018   Lipid Panel     Component Value Date/Time   CHOL 266 (H) 08/27/2018 1016   TRIG 160 (H) 08/27/2018 1016   HDL 52 08/27/2018 1016   CHOLHDL 5.1 (H) 08/27/2018 1016   CHOLHDL 4.8 10/08/2013 1130   VLDL 35 10/08/2013 1130   LDLCALC 182 (H) 08/27/2018 1016   LABVLDL 32 08/27/2018 1016     Assessment and Plan: 1. HTN (hypertension), benign Controlled- blood pressure at last office visit was 139/76 Counseled on blood pressure goal of less than 130/80, low-sodium, DASH diet, medication compliance, 150 minutes of moderate intensity exercise per week. Discussed medication compliance, adverse effects. - hydrochlorothiazide (MICROZIDE) 12.5 MG capsule; Take 1 capsule (12.5 mg total) by mouth daily.  Dispense: 90 capsule; Refill: 1  2. Chronic midline low back pain without sciatica Stable - cyclobenzaprine (FLEXERIL) 10 MG tablet; Take 1 tablet (10 mg total) by mouth 2 (two) times daily as needed for muscle spasms.  Dispense: 60 tablet; Refill: 1  3. Vertigo Uncontrolled She will need to return for vestibular rehab - meclizine (ANTIVERT) 25 MG tablet; Take 1 tablet (25 mg total) by mouth 3 (three) times daily as needed for dizziness.  Dispense: 60 tablet; Refill: 1  4. Impingement syndrome of right shoulder New diagnosis We will switch from ibuprofen to meloxicam and also increase Flexeril dose If symptoms persist after 3 weeks will consider other etiologies and possibly steroid therapy - meloxicam (MOBIC) 7.5 MG tablet; Take 1 tablet (7.5 mg total) by mouth daily.  Dispense: 30 tablet; Refill: 1  5. Pure hypercholesterolemia Uncontrolled Low-cholesterol diet -  atorvastatin (LIPITOR) 20 MG tablet; Take 1 tablet (20 mg total) by mouth daily.  Dispense: 90 tablet; Refill: 1   Follow Up Instructions: 3 months   I discussed the assessment and treatment plan with the patient. The patient was provided an opportunity to ask questions and all were answered. The patient agreed with the plan and demonstrated an understanding of the instructions.   The patient was advised to call back or seek an in-person evaluation if the symptoms worsen or if the condition fails to improve as anticipated.     I provided 20 minutes total of non-face-to-face time during this encounter including median intraservice time, reviewing previous notes, labs, imaging, medications, management and patient verbalized understanding.  Charlott Rakes, MD, FAAFP. [facility] and [place], [place]   04/13/2019, 9:07 AM

## 2019-04-13 NOTE — Progress Notes (Signed)
Patient has been called and DOB has been verified. Patient has been screened and transferred to PCP to start phone visit.   Pain in right shoulder

## 2019-05-14 ENCOUNTER — Ambulatory Visit (INDEPENDENT_AMBULATORY_CARE_PROVIDER_SITE_OTHER): Payer: 59 | Admitting: Primary Care

## 2019-05-14 ENCOUNTER — Other Ambulatory Visit: Payer: Self-pay

## 2019-05-14 DIAGNOSIS — J029 Acute pharyngitis, unspecified: Secondary | ICD-10-CM

## 2019-05-14 MED ORDER — FLUCONAZOLE 150 MG PO TABS: 150.0000 mg | ORAL_TABLET | Freq: Once | ORAL | 0 refills | Status: AC

## 2019-05-14 MED ORDER — AMOXICILLIN-POT CLAVULANATE 875-125 MG PO TABS: 1.0000 | ORAL_TABLET | Freq: Two times a day (BID) | ORAL | 0 refills | Status: DC

## 2019-05-14 NOTE — Progress Notes (Signed)
Virtual Visit via Telephone Note  I connected with Stacy Bryan on 05/14/19 at 11:10 AM EST by telephone and verified that I am speaking with the correct person using two identifiers.   I discussed the limitations, risks, security and privacy concerns of performing an evaluation and management service by telephone and the availability of in person appointments. I also discussed with the patient that there may be a patient responsible charge related to this service. The patient expressed understanding and agreed to proceed.   History of Present Illness: Stacy Bryan is having a tele visit for ear and throat pain. Both started at the same time approximately 3 days ago. No fever and chills . Asked patient to palpate both sides from ear to clavicle bone and on the left side little swollen but definitely tender.     Past Medical History:  Diagnosis Date  . Abnormal Pap smear   . Allergy   . Anemia   . GERD (gastroesophageal reflux disease)   . Hyperlipidemia   . Hypertension   . Thyroid disease   . Vertigo    Current Outpatient Medications on File Prior to Visit  Medication Sig Dispense Refill  . aspirin EC 81 MG tablet Take 81 mg by mouth daily.    Marland Kitchen atorvastatin (LIPITOR) 20 MG tablet Take 1 tablet (20 mg total) by mouth daily. 90 tablet 1  . calcium carbonate (TITRALAC) 420 MG CHEW chewable tablet Chew by mouth.    . Cholecalciferol (VITAMIN D-3 PO) Take 400 Units by mouth daily.     . cyclobenzaprine (FLEXERIL) 10 MG tablet Take 1 tablet (10 mg total) by mouth 2 (two) times daily as needed for muscle spasms. 60 tablet 1  . fish oil-omega-3 fatty acids 1000 MG capsule Take 2 g by mouth daily.    . hydrochlorothiazide (MICROZIDE) 12.5 MG capsule Take 1 capsule (12.5 mg total) by mouth daily. 90 capsule 1  . levothyroxine (EUTHYROX) 75 MCG tablet Take 1 tablet (75 mcg total) by mouth daily before breakfast. 30 tablet 2  . magnesium oxide (MAG-OX) 400 MG tablet Take 400 mg by  mouth daily.     . meclizine (ANTIVERT) 25 MG tablet Take 1 tablet (25 mg total) by mouth 3 (three) times daily as needed for dizziness. 60 tablet 1  . meloxicam (MOBIC) 7.5 MG tablet Take 1 tablet (7.5 mg total) by mouth daily. 30 tablet 1  . OVER THE COUNTER MEDICATION Chlor Tab, two tablets daily for allergies.     Current Facility-Administered Medications on File Prior to Visit  Medication Dose Route Frequency Provider Last Rate Last Admin  . 0.9 %  sodium chloride infusion  500 mL Intravenous Once Armbruster, Carlota Raspberry, MD       Observations/Objective: Review of Systems  HENT: Positive for ear pain and sore throat.        Unable to look in mouth    Assessment and Plan: Diagnoses and all orders for this visit:  Acute pharyngitis, unspecified etiology Treat empirically with antibiotics ear pain and sore throat unable to determine exudate Left side from ear to clavicle tender, slightly swollen and difficulty to swallow.  No tenderness over frontal or maxillary nodes.  Other orders -     amoxicillin-clavulanate (AUGMENTIN) 875-125 MG tablet; Take 1 tablet by mouth 2 (two) times daily. -     fluconazole (DIFLUCAN) 150 MG tablet; Take 1 tablet (150 mg total) by mouth once for 1 dose. May repeat dose in 2-3 days  if symptoms remain    Follow Up Instructions:    I discussed the assessment and treatment plan with the patient. The patient was provided an opportunity to ask questions and all were answered. The patient agreed with the plan and demonstrated an understanding of the instructions.   The patient was advised to call back or seek an in-person evaluation if the symptoms worsen or if the condition fails to improve as anticipated.  I provided 10 minutes of non-face-to-face time during this encounter.   Kerin Perna, NP

## 2019-06-08 ENCOUNTER — Ambulatory Visit: Payer: 59 | Admitting: Family Medicine

## 2019-06-08 ENCOUNTER — Other Ambulatory Visit: Payer: Self-pay

## 2019-06-08 ENCOUNTER — Encounter: Payer: Self-pay | Admitting: Family Medicine

## 2019-06-08 DIAGNOSIS — M25511 Pain in right shoulder: Secondary | ICD-10-CM | POA: Diagnosis not present

## 2019-06-08 DIAGNOSIS — M79644 Pain in right finger(s): Secondary | ICD-10-CM

## 2019-06-08 DIAGNOSIS — G8929 Other chronic pain: Secondary | ICD-10-CM

## 2019-06-08 DIAGNOSIS — M25559 Pain in unspecified hip: Secondary | ICD-10-CM

## 2019-06-08 DIAGNOSIS — M25561 Pain in right knee: Secondary | ICD-10-CM | POA: Diagnosis not present

## 2019-06-08 DIAGNOSIS — M2550 Pain in unspecified joint: Secondary | ICD-10-CM

## 2019-06-08 DIAGNOSIS — E063 Autoimmune thyroiditis: Secondary | ICD-10-CM

## 2019-06-08 DIAGNOSIS — R5383 Other fatigue: Secondary | ICD-10-CM

## 2019-06-08 MED ORDER — DICLOFENAC SODIUM 1 % EX GEL: 4.0000 g | Freq: Four times a day (QID) | CUTANEOUS | 6 refills | Status: DC | PRN

## 2019-06-08 NOTE — Progress Notes (Signed)
Office Visit Note   Patient: Stacy Bryan           Date of Birth: October 12, 1955           MRN: [id number] Visit Date: 06/08/2019 Requested by: Charlott Rakes, MD [place],  [place] [id number] PCP: Charlott Rakes, MD  Subjective: Chief Complaint  Patient presents with  . Right Shoulder - Pain    Pain x a couple months. NKI. Constant pain in the anterior shoulder/chest area. Can raise arm up, some days better than others. No sleep disturbance.  . Right Knee - Pain    Pain x 6 months and worsening. NKI. One hurts no worse than the other. Popping. No giving way/falls.   . Left Knee - Pain  . Right Thumb - Pain    Stiffness in the thumb every morning x 1 month. Feels like something "pops in & out" when she moves the thumb. Pain in the [facility]. NKI.  . pain bilateral hips    HPI: She is here with multiple areas of pain.  She has had right posterior hip pain off-and-on for probably a year or more.  About 6 months ago she started having increasing pain in her right posterior hip with no injury.  Sometime after that she started having pain in her right shoulder, then in both knees left greater than right, and most recently in her right thumb.  She does not know what triggered the onset of her pains.  She does have a strong family history of arthritis with multiple family members including siblings having had joint replacements.  She thinks one of her sisters has gout.  She is not sure whether anybody has other rheumatologic disease.  There have been no changes in her medications to account for her pains.  She does have a history of autoimmune thyroid disease and has had radioactive therapy.  She is now hypothyroid and treated with medication.  Her TPO antibodies were positive in 2018.  She has intolerance to dairy and tries to avoid it.               ROS: No fevers or chills.  All other systems were reviewed and are negative.  Objective: Vital Signs: There were no vitals  taken for this visit.  Physical Exam:  General:  Alert and oriented, in no acute distress. Pulm:  Breathing unlabored. Psy:  Normal mood, congruent affect. Skin: No rash. Right shoulder: Full active range of motion compared to the left.  Pain with empty can test but 5/5 strength throughout.  She is tender in the lateral deltoid. Right thumb: She has a tender nodule at the A1 pulley and it triggers in flexion.  No swelling in the IP joint or the MCP joint. Right hip: No pain with internal rotation and she has good range of motion.  No tenderness over the greater trochanter.  She does have tenderness in the posterior hip, gluteus medius area. No effusion, 2+ patellofemoral crepitus on the left, 1+ on the right.  Full active extension, flexion of 120 degrees.  Both knees are tender on the medial joint line.  No laxity with varus or valgus stress.  Imaging: None today  Assessment & Plan: 1.  Right shoulder pain, possibly impingement.  Cannot rule out PMR or other autoimmune disease. -We will draw some labs today.  If labs are normal, then possibly physical therapy. -Recommended trial of elimination of dairy and gluten to see if pain improves in  all joints.  2.  Right trigger thumb -We discussed options and elected to splint the thumb in extension, try Voltaren gel and ice applications.  If symptoms persist, occupational therapy.  If that does not help, then cortisone injection.  3.  Chronic right posterior hip pain, etiology uncertain. -Labs as above.  If negative, then physical therapy.  If still no relief, then lumbar x-rays and possibly MRI scan.  4.  Left greater than right knee pain possibly due to DJD -Trial of Voltaren gel.     Procedures: No procedures performed  No notes on file     PMFS History: Patient Active Problem List   Diagnosis Date Noted  . Hypothyroidism, postablative 09/29/2017  . Hyperthyroidism 02/26/2017  . Vertigo 02/26/2017  . Chronic venous  insufficiency 01/10/2017  . Varicose veins of both lower extremities with complications [id number]  . Preventative health care 07/04/2014  . Chronic maxillary sinusitis 07/04/2014  . Midline low back pain without sciatica 10/08/2013  . Encounter to establish care 10/08/2013  . Other specified hypothyroidism 10/08/2013  . Breast cancer screening 10/08/2013  . Colon cancer screening 10/08/2013  . Subclinical hypothyroidism 10/02/2011  . HTN (hypertension), benign 06/01/2011  . Dyslipidemia 06/01/2011  . GERD (gastroesophageal reflux disease) 06/01/2011  . History of seasonal allergies 06/01/2011   Past Medical History:  Diagnosis Date  . Abnormal Pap smear   . Allergy   . Anemia   . GERD (gastroesophageal reflux disease)   . Hyperlipidemia   . Hypertension   . Thyroid disease   . Vertigo     Family History  Problem Relation Age of Onset  . Hypertension Mother   . Heart disease Father   . Asthma Father   . Hyperlipidemia Father   . Asthma Sister   . Breast cancer Sister   . Stomach cancer Sister   . Thyroid disease Sister        Surgery  . Heart disease Sister   . Diabetes Sister   . Gout Sister   . Diabetes Sister   . Ulcers Sister   . Cancer Maternal Grandmother   . Colon cancer Neg Hx   . Esophageal cancer Neg Hx   . Liver cancer Neg Hx   . Pancreatic cancer Neg Hx   . Rectal cancer Neg Hx     Past Surgical History:  Procedure Laterality Date  . ABDOMINAL HYSTERECTOMY  1997   total  . FOOT SURGERY     both feet  20 yrs ago  . OVARY SURGERY  1980's   removal of R ovary (cyst)   Social History   Occupational History  . Not on file  Tobacco Use  . Smoking status: Former Smoker    Quit date: 06/02/2006    Years since quitting: 13.0  . Smokeless tobacco: Never Used  Substance and Sexual Activity  . Alcohol use: Yes    Comment: occassionally  . Drug use: No  . Sexual activity: Not on file

## 2019-06-10 ENCOUNTER — Telehealth: Payer: Self-pay | Admitting: Family Medicine

## 2019-06-10 LAB — C-REACTIVE PROTEIN: CRP: 7.4 mg/L (ref <8.0)

## 2019-06-10 LAB — CYCLIC CITRUL PEPTIDE ANTIBODY, IGG: Cyclic Citrullin Peptide Ab: <16 UNITS

## 2019-06-10 LAB — ANA: Anti Nuclear Antibody (ANA): NEGATIVE

## 2019-06-10 LAB — THYROID PEROXIDASE ANTIBODY: Thyroperoxidase Ab SerPl-aCnc: 97 IU/mL — ABNORMAL HIGH (ref <9)

## 2019-06-10 LAB — RHEUMATOID FACTOR: Rheumatoid fact SerPl-aCnc: <14 IU/mL (ref <14)

## 2019-06-10 LAB — URIC ACID: Uric Acid, Serum: 3.8 mg/dL (ref 2.5–7.0)

## 2019-06-10 LAB — SEDIMENTATION RATE: Sed Rate: 25 mm/h (ref 0–30)

## 2019-06-10 NOTE — Telephone Encounter (Signed)
All labs look good except elevated TPO antibodies.

## 2019-06-14 DIAGNOSIS — R5383 Other fatigue: Secondary | ICD-10-CM | POA: Insufficient documentation

## 2019-06-17 ENCOUNTER — Other Ambulatory Visit: Payer: Self-pay

## 2019-06-17 ENCOUNTER — Ambulatory Visit (HOSPITAL_BASED_OUTPATIENT_CLINIC_OR_DEPARTMENT_OTHER): Payer: 59 | Admitting: Physician Assistant

## 2019-06-17 ENCOUNTER — Ambulatory Visit (HOSPITAL_COMMUNITY)
Admission: RE | Admit: 2019-06-17 | Discharge: 2019-06-17 | Disposition: A | Discharge status: Home / Self Care | Payer: 59 | Source: Ambulatory Visit | Attending: Physician Assistant | Admitting: Physician Assistant

## 2019-06-17 VITALS — BP 147/91 | HR 59 | Temp 97.3°F | Resp 16 | Ht 70.0 in | Wt 220.0 lb

## 2019-06-17 DIAGNOSIS — G4452 New daily persistent headache (NDPH): Secondary | ICD-10-CM | POA: Diagnosis present

## 2019-06-17 DIAGNOSIS — H538 Other visual disturbances: Secondary | ICD-10-CM | POA: Diagnosis not present

## 2019-06-17 DIAGNOSIS — H53149 Visual discomfort, unspecified: Secondary | ICD-10-CM

## 2019-06-17 DIAGNOSIS — R519 Headache, unspecified: Secondary | ICD-10-CM

## 2019-06-17 MED ORDER — KETOROLAC TROMETHAMINE 60 MG/2ML IM SOLN: 60.0000 mg | Freq: Once | INTRAMUSCULAR | Status: AC

## 2019-06-17 NOTE — Progress Notes (Signed)
C/o headache X 2 weeks Pt took COVID 2nd vaccine started experiencing headache 2 day after vaccine

## 2019-06-17 NOTE — Patient Instructions (Addendum)
To ED if your headache becomes worse or call 911 if severe.  General Headache Without Cause A headache is pain or discomfort that is felt around the head or neck area. There are many causes and types of headaches. In some cases, the cause may not be found. Follow these instructions at home: Watch your condition for any changes. Let your doctor know about them. Take these steps to help with your condition: Managing pain      Take over-the-counter and prescription medicines only as told by your doctor.  Lie down in a dark, quiet room when you have a headache.  If told, put ice on your head and neck area: ? Put ice in a plastic bag. ? Place a towel between your skin and the bag. ? Leave the ice on for 20 minutes, 2-3 times per day.  If told, put heat on the affected area. Use the heat source that your doctor recommends, such as a moist heat pack or a heating pad. ? Place a towel between your skin and the heat source. ? Leave the heat on for 20-30 minutes. ? Remove the heat if your skin turns bright red. This is very important if you are unable to feel pain, heat, or cold. You may have a greater risk of getting burned.  Keep lights dim if bright lights bother you or make your headaches worse. Eating and drinking  Eat meals on a regular schedule.  If you drink alcohol: ? Limit how much you use to:  0-1 drink a day for women.  0-2 drinks a day for men. ? Be aware of how much alcohol is in your drink. In the [place] one drink equals one 12 oz bottle of beer (355 mL), one 5 oz glass of wine (148 mL), or one 1 oz glass of hard liquor (44 mL).  Stop drinking caffeine, or reduce how much caffeine you drink. General instructions   Keep a journal to find out if certain things bring on headaches. For example, write down: ? What you eat and drink. ? How much sleep you get. ? Any change to your diet or medicines.  Get a massage or try other ways to relax.  Limit stress.  Sit up  straight. Do not tighten (tense) your muscles.  Do not use any products that contain nicotine or tobacco. This includes cigarettes, e-cigarettes, and chewing tobacco. If you need help quitting, ask your doctor.  Exercise regularly as told by your doctor.  Get enough sleep. This often means 7-9 hours of sleep each night.  Keep all follow-up visits as told by your doctor. This is important. Contact a doctor if:  Your symptoms are not helped by medicine.  You have a headache that feels different than the other headaches.  You feel sick to your stomach (nauseous) or you throw up (vomit).  You have a fever. Get help right away if:  Your headache gets very bad quickly.  Your headache gets worse after a lot of physical activity.  You keep throwing up.  You have a stiff neck.  You have trouble seeing.  You have trouble speaking.  You have pain in the eye or ear.  Your muscles are weak or you lose muscle control.  You lose your balance or have trouble walking.  You feel like you will pass out (faint) or you pass out.  You are mixed up (confused).  You have a seizure. Summary  A headache is pain or discomfort that  is felt around the head or neck area.  There are many causes and types of headaches. In some cases, the cause may not be found.  Keep a journal to help find out what causes your headaches. Watch your condition for any changes. Let your doctor know about them.  Contact a doctor if you have a headache that is different from usual, or if your headache is not helped by medicine.  Get help right away if your headache gets very bad, you throw up, you have trouble seeing, you lose your balance, or you have a seizure. This information is not intended to replace advice given to you by your health care provider. Make sure you discuss any questions you have with your health care provider. Document Revised: 10/20/2017 Document Reviewed: 10/20/2017 Elsevier Patient  Education  [place].

## 2019-06-17 NOTE — Progress Notes (Signed)
Stacy Bryan, is a 64 y.o. female  [id number]  [id number]  DOB - [birth year]  Subjective:  Chief Complaint and HPI: Stacy Bryan is a 64 y.o. female here today for HA for several month that has worsened over the last few weeks and developed blurry vision and photophobia on and off over the last 2 days.  She had her 2nd Covid vaccine recently.  HA seemed to worsen after that but she was having HA prior to any vaccine.  No N/V.  Unable to work sue to HA.  Taking meloxicam for back pain but that hasn't helped with HA.  HA is in the frontal region and there is throbbing.     ROS:   Constitutional:  No f/c, No night sweats, No unexplained weight loss. EENT:  No vision changes, + intermittent blurry vision and photophobia X 2 days, No hearing changes. No mouth, throat, or ear problems.  Respiratory: No cough, No SOB Cardiac: No CP, no palpitations GI:  No abd pain, No N/V/D. GU: No Urinary s/sx Musculoskeletal: No joint pain Neuro: + headache, no dizziness(ongoing intermittent vertigo), no motor weakness.  Skin: No rash Endocrine:  No polydipsia. No polyuria.  Psych: Denies SI/HI  No problems updated.  ALLERGIES: No Known Allergies  PAST MEDICAL HISTORY: Past Medical History:  Diagnosis Date  . Abnormal Pap smear   . Allergy   . Anemia   . GERD (gastroesophageal reflux disease)   . Hyperlipidemia   . Hypertension   . Thyroid disease   . Vertigo     MEDICATIONS AT HOME: Prior to Admission medications   Medication Sig Start Date End Date Taking? Authorizing Provider  aspirin EC 81 MG tablet Take 81 mg by mouth daily.   Yes [provider]  atorvastatin (LIPITOR) 20 MG tablet Take 1 tablet (20 mg total) by mouth daily. 04/13/19  Yes Charlott Rakes, MD  Cholecalciferol (VITAMIN D-3 PO) Take 400 Units by mouth daily.    Yes [provider]  cyclobenzaprine (FLEXERIL) 10 MG tablet Take 1 tablet (10 mg total) by mouth 2 (two) times daily as needed for  muscle spasms. 04/13/19  Yes Charlott Rakes, MD  fish oil-omega-3 fatty acids 1000 MG capsule Take 2 g by mouth daily.   Yes [provider]  hydrochlorothiazide (MICROZIDE) 12.5 MG capsule Take 1 capsule (12.5 mg total) by mouth daily. 04/13/19  Yes Charlott Rakes, MD  levothyroxine (EUTHYROX) 75 MCG tablet Take 1 tablet (75 mcg total) by mouth daily before breakfast. 03/29/19  Yes Elayne Snare, MD  magnesium oxide (MAG-OX) 400 MG tablet Take 400 mg by mouth daily.    Yes [provider]  meclizine (ANTIVERT) 25 MG tablet Take 1 tablet (25 mg total) by mouth 3 (three) times daily as needed for dizziness. 04/13/19  Yes Charlott Rakes, MD  meloxicam (MOBIC) 7.5 MG tablet Take 1 tablet (7.5 mg total) by mouth daily. 04/13/19  Yes Charlott Rakes, MD  calcium carbonate (TITRALAC) 420 MG CHEW chewable tablet Chew by mouth.    [provider]  diclofenac Sodium (VOLTAREN) 1 % GEL Apply 4 g topically 4 (four) times daily as needed. Patient not taking: Reported on 06/17/2019 06/08/19   Hilts, Legrand Como, MD  OVER THE COUNTER MEDICATION Chlor Tab, two tablets daily for allergies.    [provider]     Objective:  EXAM:   Vitals:   06/17/19 1115  BP: (!) 147/91  Pulse: (!) 59  Resp: 16  Temp: (!) 97.3 F (  36.3 C)  SpO2: 98%  Weight: 220 lb (99.8 kg)  Height: 5\' 10"  (1.778 m)    General appearance : A&OX3. NAD. Non-toxic-appearing HEENT: Atraumatic and Normocephalic.  PERRLA. EOM intact.  TM congested B. Mouth-MMM, post pharynx WNL w/o erythema, No PND. Neck: supple, no JVD. No cervical lymphadenopathy. No thyromegaly Chest/Lungs:  Breathing-non-labored, Good air entry bilaterally, breath sounds normal without rales, rhonchi, or wheezing  CVS: S1 S2 regular, no murmurs, gallops, rubs  Extremities: Bilateral Lower Ext shows no edema, both legs are warm to touch with = pulse throughout Neurology:  CN II-XII grossly intact, Non focal.  Facial and extremity  symmetry intact(removed mask for facial assessment and maintained 6 feet for that part of exam.   Psych:  TP linear. J/I WNL. Normal speech. Appropriate eye contact and affect.  Skin:  No Rash  Data Review Lab Results  Component Value Date   HGBA1C 6.2 02/26/2017   HGBA1C 5.7 10/08/2013     Assessment & Plan   1. Acute intractable headache, unspecified headache type - CT Head Wo Contrast; Future - ketorolac (TORADOL) injection 60 mg -To ED if worsens prior to CT/call 911.  Patient agrees.  2. Photophobia New onset with #1  3. Blurry vision New onset with #1  4. New daily persistent headache - ketorolac (TORADOL) injection 60 mg - CT Head Wo Contrast; Future   Patient have been counseled extensively about nutrition and exercise  Return in about 1 month (around 07/18/2019) for with PCP for chronic conditions.  The patient was given clear instructions to go to ER or return to medical center if symptoms don't improve, worsen or new problems develop. The patient verbalized understanding. The patient was told to call to get lab results if they haven't heard anything in the next week.     Freeman Caldron, PA-C [facility] [place], [place]   06/17/2019, 12:02 PMPatient ID: Stacy Bryan, female   DOB: February 05, 1956, 64 y.o.   MRN: [id number]

## 2019-06-30 ENCOUNTER — Other Ambulatory Visit: Payer: Self-pay | Admitting: Family Medicine

## 2019-06-30 DIAGNOSIS — M7541 Impingement syndrome of right shoulder: Secondary | ICD-10-CM

## 2019-07-19 ENCOUNTER — Other Ambulatory Visit: Payer: 59

## 2019-07-19 ENCOUNTER — Other Ambulatory Visit (INDEPENDENT_AMBULATORY_CARE_PROVIDER_SITE_OTHER): Payer: 59

## 2019-07-19 ENCOUNTER — Other Ambulatory Visit: Payer: Self-pay

## 2019-07-19 DIAGNOSIS — E890 Postprocedural hypothyroidism: Secondary | ICD-10-CM | POA: Diagnosis not present

## 2019-07-19 LAB — TSH: TSH: 2.64 u[IU]/mL (ref 0.35–4.50)

## 2019-07-19 LAB — T4, FREE: Free T4: 0.95 ng/dL (ref 0.60–1.60)

## 2019-07-21 ENCOUNTER — Ambulatory Visit: Payer: 59 | Admitting: Family Medicine

## 2019-07-22 ENCOUNTER — Telehealth (INDEPENDENT_AMBULATORY_CARE_PROVIDER_SITE_OTHER): Payer: 59 | Admitting: Endocrinology

## 2019-07-22 DIAGNOSIS — E890 Postprocedural hypothyroidism: Secondary | ICD-10-CM

## 2019-07-22 NOTE — Progress Notes (Signed)
Patient ID: Stacy Bryan, female   DOB: 1955/05/22, 64 y.o.   MRN: [id number]                                                                                                              I connected with the above-named patient by video enabled telemedicine application and verified that I am speaking with the correct person. The patient was explained the limitations of evaluation and management by telemedicine and the availability of in person appointments.  Patient also understood that there may be a patient responsible charge related to this service . Location of the patient: Patient's home . Location of the provider: Physician office Only the patient and myself were participating in the encounter The patient understood the above statements and agreed to proceed.   Chief complaint: Follow-up visit for thyroid   History of Present Illness:    Background history: About 2 years ago she had gone to see a physician because of problems with weight gain and fatigue that had been going on for a few months Also she was having some palpitations at that time but no sweating or heat intolerance or shakiness Not clear if she had a goiter in the initial exam and no records are available including baseline labs. However her TSH was low in 09/2011 She was told to have Hyperthyroidism and started on methimazole 5 mg daily, probably after her scan in 6/17 However she was not followed for too long and has been mostly followed by PCP with no change in her methimazole and only irregular labs and follow-up  RECENT history:  She had a toxic nodular goiter without Graves' disease diagnosed on initial evaluation. She was treated with radioactive iodine, given 29.7 mCi on 06/06/17  Subsequently she has had HYPOTHYROIDISM  She was having fatigue and low free T4 level when she was started on 50 mcg of levothyroxine in 07/2017 In 6/19 she was having fatigue consistently and her dose was increased to 75 mcg    Subsequently the dose has been continued unchanged TSH was high in 8/19 from her not taking her medication consistently  She is again having the same complaints of fatigue and somnolence, she was recommended sleep study and has not had etiology determined so far  She is very consistent with taking her thyroid supplement in the mornings before eating  No cold intolerance, weight change   Last exam in the office she did not have a palpable thyroid enlargement  Again her TSH appears to be consistently normal compared to about a year ago    Wt Readings from Last 3 Encounters:  06/17/19 220 lb (99.8 kg)  01/11/19 221 lb 9.6 oz (100.5 kg)  08/27/18 225 lb 3.2 oz (102.2 kg)     Thyroid function tests as follows:      Lab Results  Component Value Date   FREET4 0.95 07/19/2019   FREET4 1.09 08/27/2018   FREET4 0.91 07/13/2018   T3FREE 3.0 08/04/2017   T3FREE 4.7 (H) 07/01/2017   T3FREE 3.3  03/12/2017   TSH 2.64 07/19/2019   TSH 1.560 08/27/2018   TSH 1.89 07/13/2018     Lab Results  Component Value Date   THYROTRECAB <1.10 11/28/2017   THYROTRECAB <0.50 03/12/2017    THYROID SCAN done in 7/18 showed uptake of 40.1% and multinodular goiter   Allergies as of 07/22/2019   No Known Allergies     Medication List       Accurate as of July 22, 2019  8:11 AM. If you have any questions, ask your nurse or doctor.        aspirin EC 81 MG tablet Take 81 mg by mouth daily.   atorvastatin 20 MG tablet Commonly known as: LIPITOR Take 1 tablet (20 mg total) by mouth daily.   calcium carbonate 420 MG Chew chewable tablet Commonly known as: [place] by mouth.   cyclobenzaprine 10 MG tablet Commonly known as: FLEXERIL Take 1 tablet (10 mg total) by mouth 2 (two) times daily as needed for muscle spasms.   diclofenac Sodium 1 % Gel Commonly known as: Voltaren Apply 4 g topically 4 (four) times daily as needed.   fish oil-omega-3 fatty acids 1000 MG capsule Take 2  g by mouth daily.   hydrochlorothiazide 12.5 MG capsule Commonly known as: MICROZIDE Take 1 capsule (12.5 mg total) by mouth daily.   levothyroxine 75 MCG tablet Commonly known as: Euthyrox Take 1 tablet (75 mcg total) by mouth daily before breakfast.   magnesium oxide 400 MG tablet Commonly known as: MAG-OX Take 400 mg by mouth daily.   meclizine 25 MG tablet Commonly known as: ANTIVERT Take 1 tablet (25 mg total) by mouth 3 (three) times daily as needed for dizziness.   meloxicam 7.5 MG tablet Commonly known as: MOBIC TAKE 1 TABLET BY MOUTH ONCE DAILY DISCONTINUE  IBUPROFEN   OVER THE COUNTER MEDICATION Chlor Tab, two tablets daily for allergies.   VITAMIN D-3 PO Take 400 Units by mouth daily.           Past Medical History:  Diagnosis Date  . Abnormal Pap smear   . Allergy   . Anemia   . GERD (gastroesophageal reflux disease)   . Hyperlipidemia   . Hypertension   . Thyroid disease   . Vertigo     Past Surgical History:  Procedure Laterality Date  . ABDOMINAL HYSTERECTOMY  1997   total  . FOOT SURGERY     both feet  20 yrs ago  . OVARY SURGERY  1980's   removal of R ovary (cyst)    Family History  Problem Relation Age of Onset  . Hypertension Mother   . Heart disease Father   . Asthma Father   . Hyperlipidemia Father   . Asthma Sister   . Breast cancer Sister   . Stomach cancer Sister   . Thyroid disease Sister        Surgery  . Heart disease Sister   . Diabetes Sister   . Gout Sister   . Diabetes Sister   . Ulcers Sister   . Cancer Maternal Grandmother   . Colon cancer Neg Hx   . Esophageal cancer Neg Hx   . Liver cancer Neg Hx   . Pancreatic cancer Neg Hx   . Rectal cancer Neg Hx     Social History:  reports that she quit smoking about 13 years ago. She has never used smokeless tobacco. She reports current alcohol use. She reports that she does not use drugs.  Allergies: No Known Allergies   Review of Systems  She has been taking  meloxicam and Flexeril for various pains   Examination:   There were no vitals taken for this visit.        She looks well on her video image   Assessment/Plan:  HYPOTHYROIDISM secondary to I-131 treatment for toxic nodular goiter  Her hypothyroidism is relatively mild and she has not needed an increase in her thyroid dose since 2019 She is taking her levothyroxine consistently before breakfast TSH is again normal  She does complain of fatigue but this is likely to be from sleep disturbance Discussed that she does not have Hashimoto's thyroiditis and this has no relationship to her symptoms of fatigue  On her previous exam her thyroid enlargement was not present  She will again continue 75 mcg of levothyroxine and follow-up annually Discussed that she needs to have reevaluation for her fatigue with her PCP including possibly sleep study and follow-up for any iron or B12 deficiencies  Elayne Snare 07/22/2019, 8:11 AM       Note: This office note was prepared with Dragon voice recognition system technology. Any transcriptional errors that result from this process are unintentional.

## 2019-09-01 ENCOUNTER — Telehealth: Payer: Self-pay

## 2019-09-01 NOTE — Telephone Encounter (Signed)
Paperwork has been received and will be placed in PCP box.  Patient will be contacted once paperwork is ready.

## 2019-09-02 NOTE — Telephone Encounter (Signed)
Patient has been called and informed of paperwork being faxed and copies ready for pick up.  Copies has been placed upfront for pick up.  Copy has been placed in wall folder in nurse station.

## 2019-09-22 ENCOUNTER — Other Ambulatory Visit: Payer: Self-pay | Admitting: Endocrinology

## 2019-09-22 ENCOUNTER — Other Ambulatory Visit: Payer: Self-pay | Admitting: Family Medicine

## 2019-09-22 DIAGNOSIS — M7541 Impingement syndrome of right shoulder: Secondary | ICD-10-CM

## 2019-09-30 ENCOUNTER — Ambulatory Visit: Payer: 59 | Attending: Family Medicine | Admitting: Family Medicine

## 2019-09-30 ENCOUNTER — Other Ambulatory Visit: Payer: Self-pay

## 2019-09-30 VITALS — BP 137/78 | HR 63 | Ht 70.0 in | Wt 223.0 lb

## 2019-09-30 DIAGNOSIS — E1169 Type 2 diabetes mellitus with other specified complication: Secondary | ICD-10-CM

## 2019-09-30 DIAGNOSIS — M545 Low back pain: Secondary | ICD-10-CM

## 2019-09-30 DIAGNOSIS — I10 Essential (primary) hypertension: Secondary | ICD-10-CM | POA: Diagnosis not present

## 2019-09-30 DIAGNOSIS — E785 Hyperlipidemia, unspecified: Secondary | ICD-10-CM

## 2019-09-30 DIAGNOSIS — G8929 Other chronic pain: Secondary | ICD-10-CM

## 2019-09-30 DIAGNOSIS — R42 Dizziness and giddiness: Secondary | ICD-10-CM | POA: Diagnosis not present

## 2019-09-30 LAB — POCT GLYCOSYLATED HEMOGLOBIN (HGB A1C): HbA1c, POC (controlled diabetic range): 7.2 % — AB (ref 0.0–7.0)

## 2019-09-30 MED ORDER — ATORVASTATIN CALCIUM 20 MG PO TABS: 20.0000 mg | ORAL_TABLET | Freq: Every day | ORAL | 1 refills | Status: DC

## 2019-09-30 MED ORDER — CYCLOBENZAPRINE HCL 10 MG PO TABS: 10.0000 mg | ORAL_TABLET | Freq: Two times a day (BID) | ORAL | 1 refills | Status: DC | PRN

## 2019-09-30 MED ORDER — HYDROCHLOROTHIAZIDE 12.5 MG PO CAPS: 12.5000 mg | ORAL_CAPSULE | Freq: Every day | ORAL | 1 refills | Status: DC

## 2019-09-30 MED ORDER — ACCU-CHEK GUIDE VI STRP: ORAL_STRIP | 3 refills | Status: AC

## 2019-09-30 MED ORDER — MECLIZINE HCL 25 MG PO TABS: 25.0000 mg | ORAL_TABLET | Freq: Three times a day (TID) | ORAL | 1 refills | Status: AC | PRN

## 2019-09-30 MED ORDER — ACCU-CHEK GUIDE ME W/DEVICE KIT: PACK | 0 refills | Status: DC

## 2019-09-30 MED ORDER — MELOXICAM 15 MG PO TABS: 15.0000 mg | ORAL_TABLET | Freq: Every day | ORAL | 3 refills | Status: DC

## 2019-09-30 MED ORDER — METFORMIN HCL 500 MG PO TABS: 500.0000 mg | ORAL_TABLET | Freq: Two times a day (BID) | ORAL | 1 refills | Status: DC

## 2019-09-30 NOTE — Progress Notes (Signed)
Patient is having pain in right hip and leg.  States that she is getting a sore throat.

## 2019-09-30 NOTE — Progress Notes (Signed)
Subjective:  Patient ID: Stacy Bryan, female    DOB: [birth year]  Age: 64 y.o. MRN: [id number]  CC: Hypothyroidism and Hypertension   HPI Stacy Bryan is a 64 year old female with a history of hypertension, chronic low back pain, hypothyroidism (secondary to radioactive treatment for multinodular goiter), chronic venous insufficiency, vertigo  New diagnosis of DM today with A1c of 7.2 (was previously 6.2) .  R shoulder pain which she had complained of at the previous visit is better as she had seen orthopedics-Dr. Hilts. She has R hip and R leg pain which is described as an ache worsened by standing and relieved by walking and is a 5/10 right now. She has had numbness in her leg and sometimes on the right side of her face which occurred on a few occasions and resolved spontaneously but she denies episodes of speech abnormalities, deviation of her mouth, weakness of her extremities, abnormal gait.  She does have chronic low back pain for which she is on meloxicam 7.5 mg which she states has been ineffective.  Starting to get a sore throat; has a slight hedacahe but no fever, dyspnea, chest pain. Past Medical History:  Diagnosis Date   Abnormal Pap smear    Allergy    Anemia    GERD (gastroesophageal reflux disease)    Hyperlipidemia    Hypertension    Thyroid disease    Vertigo     Past Surgical History:  Procedure Laterality Date   ABDOMINAL HYSTERECTOMY  1997   total   FOOT SURGERY     both feet  20 yrs ago   OVARY SURGERY  1980's   removal of R ovary (cyst)    Family History  Problem Relation Age of Onset   Hypertension Mother    Heart disease Father    Asthma Father    Hyperlipidemia Father    Asthma Sister    Breast cancer Sister    Stomach cancer Sister    Thyroid disease Sister        Surgery   Heart disease Sister    Diabetes Sister    Gout Sister    Diabetes Sister    Ulcers Sister    Cancer Maternal Grandmother     Colon cancer Neg Hx    Esophageal cancer Neg Hx    Liver cancer Neg Hx    Pancreatic cancer Neg Hx    Rectal cancer Neg Hx     No Known Allergies  Outpatient Medications Prior to Visit  Medication Sig Dispense Refill   aspirin EC 81 MG tablet Take 81 mg by mouth daily.     atorvastatin (LIPITOR) 20 MG tablet Take 1 tablet (20 mg total) by mouth daily. 90 tablet 1   calcium carbonate (TITRALAC) 420 MG CHEW chewable tablet Chew by mouth.     Cholecalciferol (VITAMIN D-3 PO) Take 400 Units by mouth daily.      cyclobenzaprine (FLEXERIL) 10 MG tablet Take 1 tablet (10 mg total) by mouth 2 (two) times daily as needed for muscle spasms. 60 tablet 1   EUTHYROX 75 MCG tablet TAKE 1 TABLET BY MOUTH ONCE DAILY BEFORE BREAKFAST 30 tablet 0   fish oil-omega-3 fatty acids 1000 MG capsule Take 2 g by mouth daily.     hydrochlorothiazide (MICROZIDE) 12.5 MG capsule Take 1 capsule (12.5 mg total) by mouth daily. 90 capsule 1   magnesium oxide (MAG-OX) 400 MG tablet Take 400 mg by mouth daily.  meclizine (ANTIVERT) 25 MG tablet Take 1 tablet (25 mg total) by mouth 3 (three) times daily as needed for dizziness. 60 tablet 1   meloxicam (MOBIC) 7.5 MG tablet TAKE 1 TABLET BY MOUTH ONCE DAILY DISCONTINUE  IBUPROFEN 30 tablet 0   OVER THE COUNTER MEDICATION Chlor Tab, two tablets daily for allergies.     diclofenac Sodium (VOLTAREN) 1 % GEL Apply 4 g topically 4 (four) times daily as needed. (Patient not taking: Reported on 09/30/2019) 500 g 6   Facility-Administered Medications Prior to Visit  Medication Dose Route Frequency Provider Last Rate Last Admin   0.9 %  sodium chloride infusion  500 mL Intravenous Once Armbruster, Carlota Raspberry, MD         ROS Review of Systems  Constitutional: Negative for activity change, appetite change and fatigue.  HENT: Positive for sore throat. Negative for congestion and sinus pressure.   Eyes: Negative for visual disturbance.  Respiratory: Negative  for cough, chest tightness, shortness of breath and wheezing.   Cardiovascular: Negative for chest pain and palpitations.  Gastrointestinal: Negative for abdominal distention, abdominal pain and constipation.  Endocrine: Negative for polydipsia.  Genitourinary: Negative for dysuria and frequency (.fol).  Musculoskeletal:       See HPI  Skin: Negative for rash.  Neurological: Negative for tremors, light-headedness and numbness.  Hematological: Does not bruise/bleed easily.  Psychiatric/Behavioral: Negative for agitation and behavioral problems.    Objective:  BP 137/78    Pulse 63    Ht '5\' 10"'  (1.778 m)    Wt 223 lb (101.2 kg)    SpO2 97%    BMI 32.00 kg/m   BP/Weight 09/30/2019 06/17/2019 2/59/5638  Systolic BP 756 433 295  Diastolic BP 78 91 76  Wt. (Lbs) 223 220 221.6  BMI 32 31.57 31.8      Physical Exam Constitutional:      Appearance: She is well-developed.  Neck:     Vascular: No JVD.  Cardiovascular:     Rate and Rhythm: Normal rate.     Heart sounds: Normal heart sounds. No murmur heard.   Pulmonary:     Effort: Pulmonary effort is normal.     Breath sounds: Normal breath sounds. No wheezing or rales.  Chest:     Chest wall: No tenderness.  Abdominal:     General: Bowel sounds are normal. There is no distension.     Palpations: Abdomen is soft. There is no mass.     Tenderness: There is no abdominal tenderness.  Musculoskeletal:        General: Normal range of motion.     Right lower leg: Edema (1+) present.     Left lower leg: Edema (1+) present.     Comments: TTP of right upper hip bone  Neurological:     Mental Status: She is alert and oriented to person, place, and time.  Psychiatric:        Mood and Affect: Mood normal.     CMP Latest Ref Rng & Units 08/27/2018 03/05/2018 02/26/2017  Glucose 65 - 99 mg/dL 113(H) 107(H) 103(H)  BUN 8 - 27 mg/dL '14 16 14  ' Creatinine 0.57 - 1.00 mg/dL 0.91 0.87 0.85  Sodium 134 - 144 mmol/L 141 141 143  Potassium 3.5  - 5.2 mmol/L 4.2 3.6 4.4  Chloride 96 - 106 mmol/L 102 108 104  CO2 20 - 29 mmol/L '21 24 25  ' Calcium 8.7 - 10.3 mg/dL 9.6 9.3 9.5  Total Protein 6.0 -  8.5 g/dL 7.3 7.7 6.9  Total Bilirubin 0.0 - 1.2 mg/dL 0.2 0.6 0.3  Alkaline Phos 39 - 117 IU/L 104 82 103  AST 0 - 40 IU/L '18 20 17  ' ALT 0 - 32 IU/L '25 25 21    ' Lipid Panel     Component Value Date/Time   CHOL 266 (H) 08/27/2018 1016   TRIG 160 (H) 08/27/2018 1016   HDL 52 08/27/2018 1016   CHOLHDL 5.1 (H) 08/27/2018 1016   CHOLHDL 4.8 10/08/2013 1130   VLDL 35 10/08/2013 1130   LDLCALC 182 (H) 08/27/2018 1016    CBC    Component Value Date/Time   WBC 5.0 03/05/2018 1424   RBC 5.14 (H) 03/05/2018 1424   HGB 14.2 03/05/2018 1424   HGB 12.6 02/26/2017 1113   HCT 44.3 03/05/2018 1424   HCT 38.0 02/26/2017 1113   PLT 249 03/05/2018 1424   PLT 247 02/26/2017 1113   MCV 86.2 03/05/2018 1424   MCV 83 02/26/2017 1113   MCH 27.6 03/05/2018 1424   MCHC 32.1 03/05/2018 1424   RDW 13.2 03/05/2018 1424   RDW 14.4 02/26/2017 1113   LYMPHSABS 1.5 03/05/2018 1424   LYMPHSABS 1.8 02/26/2017 1113   MONOABS 0.4 03/05/2018 1424   EOSABS 0.2 03/05/2018 1424   EOSABS 0.2 02/26/2017 1113   BASOSABS 0.0 03/05/2018 1424   BASOSABS 0.0 02/26/2017 1113    Lab Results  Component Value Date   HGBA1C 7.2 (A) 09/30/2019    Assessment & Plan:  1. Type 2 diabetes mellitus with other specified complication, without long-term current use of insulin (HCC) New diagnosis with A1c of 7.2 Commence Metformin Numbness which she had complained of could be due to new onset diabetes mellitus. Discussed diagnosis, lifestyle modifications, nutrition, educated on blood glucose monitoring Clinical pharmacist called to reinforce this Counseled on Diabetic diet, my plate method, 094 minutes of moderate intensity exercise/week Blood sugar logs with fasting goals of 80-120 mg/dl, random of less than 180 and in the event of sugars less than 60 mg/dl or greater  than 400 mg/dl encouraged to notify the clinic. Advised on the need for annual eye exams, annual foot exams, Pneumonia vaccine. - POCT glycosylated hemoglobin (Hb A1C) - metFORMIN (GLUCOPHAGE) 500 MG tablet; Take 1 tablet (500 mg total) by mouth 2 (two) times daily with a meal.  Dispense: 180 tablet; Refill: 1 - CMP14+EGFR - Lipid panel - Blood Glucose Monitoring Suppl (ACCU-CHEK GUIDE ME) w/Device KIT; Use daily  Dispense: 1 kit; Refill: 0 - glucose blood (ACCU-CHEK GUIDE) test strip; Use as instructed daily  Dispense: 100 each; Refill: 3 - Microalbumin / creatinine urine ratio  2. Chronic right-sided low back pain without sciatica Uncontrolled Increase meloxicam from 7.5 mg to 50 mg Discussed exercises including yoga Advised to obtain OTC Lidoderm patch - meloxicam (MOBIC) 15 MG tablet; Take 1 tablet (15 mg total) by mouth daily.  Dispense: 30 tablet; Refill: 3  3. Dyslipidemia associated with type 2 diabetes mellitus ([place]) UnControlled, will send of lipid panel and adjust regimen accordingly She has only been able to take Lipitor every other day due to myalgia. Low-cholesterol diet - atorvastatin (LIPITOR) 20 MG tablet; Take 1 tablet (20 mg total) by mouth daily.  Dispense: 90 tablet; Refill: 1  4. Chronic midline low back pain without sciatica See #2 above - cyclobenzaprine (FLEXERIL) 10 MG tablet; Take 1 tablet (10 mg total) by mouth 2 (two) times daily as needed for muscle spasms.  Dispense: 60 tablet; Refill:  1  5. HTN (hypertension), benign Controlled Counseled on blood pressure goal of less than 130/80, low-sodium, DASH diet, medication compliance, 150 minutes of moderate intensity exercise per week. Discussed medication compliance, adverse effects. - hydrochlorothiazide (MICROZIDE) 12.5 MG capsule; Take 1 capsule (12.5 mg total) by mouth daily.  Dispense: 90 capsule; Refill: 1  6. Vertigo Stable - meclizine (ANTIVERT) 25 MG tablet; Take 1 tablet (25 mg total) by mouth 3  (three) times daily as needed for dizziness.  Dispense: 60 tablet; Refill: 1    Return in about 3 months (around 12/31/2019) for dm.   Charlott Rakes, MD, FAAFP. [facility] and [place], [place]   09/30/2019, 10:03 AM

## 2019-09-30 NOTE — Patient Instructions (Addendum)
Lidocaine dermal patch What is this medicine? LIDOCAINE (LYE doe kane) causes loss of feeling in the skin and surrounding area. The medicine helps treat pain, including nerve pain. This medicine may be used for other purposes; ask your health care provider or pharmacist if you have questions. COMMON BRAND NAME(S): Aspercreme with Lidocaine, Blue-Emu, GEN7T, Lidocare, Lidoderm, Salonpas Lidocaine, ZTlido What should I tell my health care provider before I take this medicine? They need to know if you have any of these conditions:  heart disease  history of irregular heart beat  liver disease  skin conditions or sensitivity  skin infection  an unusual or allergic reaction to lidocaine, parabens, other medicines, foods, dyes, or preservatives  pregnant or trying to get pregnant  breast-feeding How should I use this medicine? This medicine is for external use only. Follow the directions on the prescription label or package. Talk to your pediatrician regarding the use of this medicine in children. Special care may be needed. Overdosage: If you think you have taken too much of this medicine contact a poison control center or emergency room at once. NOTE: This medicine is only for you. Do not share this medicine with others. What if I miss a dose? If you miss a dose, take it as soon as you can. If it is almost time for your next dose, take only that dose. Do not take double or extra doses. What may interact with this medicine? Do not take this medicine with any of the following medications:  certain medicines for irregular heart beat  MAOIs like Carbex, Eldepryl, Marplan, Nardil, and Parnate This medicine may also interact with the following medications:  other local anesthetics like pramoxine, tetracaine Do not use any other skin products on the affected area without asking your doctor or health care professional. This list may not describe all possible interactions. Give your health  care provider a list of all the medicines, herbs, non-prescription drugs, or dietary supplements you use. Also tell them if you smoke, drink alcohol, or use illegal drugs. Some items may interact with your medicine. What should I watch for while using this medicine? Tell your doctor or healthcare professional if your symptoms do not start to get better or if they get worse. Be careful to avoid injury while the area is numb from the medicine, and you are not aware of pain. If you are going to need surgery, a MRI, CT scan, or other procedure, tell your doctor that you are using this medicine. You may need to remove this patch before the procedure. Do not get this medicine in your eyes. If you do, rinse out with plenty of cool tap water. This medicine can make certain skin conditions worse. Only use it for conditions for which your doctor or health care professional has prescribed. What side effects may I notice from receiving this medicine? Side effects that you should report to your doctor or health care professional as soon as possible:  allergic reactions like skin rash, itching or hives, swelling of the face, lips, or tongue  breathing problems  chest pain or chest tightness  dizzines Side effects that usually do not require medical attention (report to your doctor or health care professional if they continue or are bothersome):  tingling, numbness at site where applied This list may not describe all possible side effects. Call your doctor for medical advice about side effects. You may report side effects to FDA at 1-800-FDA-1088. Where should I keep my medicine? Keep out   of the reach of children. See product for storage instructions. Each product may have different instructions. NOTE: This sheet is a summary. It may not cover all possible information. If you have questions about this medicine, talk to your doctor, pharmacist, or health care provider.  2020 Elsevier/Gold Standard  (2017-01-01 22:50:48)  

## 2019-10-01 LAB — CMP14+EGFR
ALT: 23 IU/L (ref 0–32)
AST: 21 IU/L (ref 0–40)
Albumin/Globulin Ratio: 1.5 (ref 1.2–2.2)
Albumin: 4.4 g/dL (ref 3.8–4.8)
Alkaline Phosphatase: 106 IU/L (ref 48–121)
BUN/Creatinine Ratio: 16 (ref 12–28)
BUN: 13 mg/dL (ref 8–27)
Bilirubin Total: 0.3 mg/dL (ref 0.0–1.2)
CO2: 27 mmol/L (ref 20–29)
Calcium: 9.8 mg/dL (ref 8.7–10.3)
Chloride: 100 mmol/L (ref 96–106)
Creatinine, Ser: 0.81 mg/dL (ref 0.57–1.00)
GFR calc Af Amer: 88 mL/min/{1.73_m2} (ref >59)
GFR calc non Af Amer: 76 mL/min/{1.73_m2} (ref >59)
Globulin, Total: 3 g/dL (ref 1.5–4.5)
Glucose: 130 mg/dL — ABNORMAL HIGH (ref 65–99)
Potassium: 4.3 mmol/L (ref 3.5–5.2)
Sodium: 139 mmol/L (ref 134–144)
Total Protein: 7.4 g/dL (ref 6.0–8.5)

## 2019-10-01 LAB — MICROALBUMIN / CREATININE URINE RATIO
Creatinine, Urine: 111.6 mg/dL
Microalb/Creat Ratio: 7 mg/g creat (ref 0–29)
Microalbumin, Urine: 8.1 ug/mL

## 2019-10-01 LAB — LIPID PANEL
Chol/HDL Ratio: 4.5 ratio — ABNORMAL HIGH (ref 0.0–4.4)
Cholesterol, Total: 224 mg/dL — ABNORMAL HIGH (ref 100–199)
HDL: 50 mg/dL (ref >39)
LDL Chol Calc (NIH): 137 mg/dL — ABNORMAL HIGH (ref 0–99)
Triglycerides: 207 mg/dL — ABNORMAL HIGH (ref 0–149)
VLDL Cholesterol Cal: 37 mg/dL (ref 5–40)

## 2019-11-07 ENCOUNTER — Other Ambulatory Visit: Payer: Self-pay | Admitting: Endocrinology

## 2020-01-03 ENCOUNTER — Other Ambulatory Visit (HOSPITAL_COMMUNITY)
Admission: RE | Admit: 2020-01-03 | Discharge: 2020-01-03 | Disposition: A | Discharge status: Home / Self Care | Payer: 59 | Source: Ambulatory Visit | Attending: Family Medicine | Admitting: Family Medicine

## 2020-01-03 ENCOUNTER — Other Ambulatory Visit: Payer: Self-pay

## 2020-01-03 ENCOUNTER — Ambulatory Visit: Payer: 59 | Attending: Family Medicine | Admitting: Family Medicine

## 2020-01-03 ENCOUNTER — Encounter: Payer: Self-pay | Admitting: Family Medicine

## 2020-01-03 VITALS — BP 137/78 | HR 57 | Ht 70.0 in | Wt 216.0 lb

## 2020-01-03 DIAGNOSIS — E1149 Type 2 diabetes mellitus with other diabetic neurological complication: Secondary | ICD-10-CM

## 2020-01-03 DIAGNOSIS — R42 Dizziness and giddiness: Secondary | ICD-10-CM

## 2020-01-03 DIAGNOSIS — Z23 Encounter for immunization: Secondary | ICD-10-CM | POA: Diagnosis not present

## 2020-01-03 DIAGNOSIS — E890 Postprocedural hypothyroidism: Secondary | ICD-10-CM | POA: Diagnosis not present

## 2020-01-03 DIAGNOSIS — R102 Pelvic and perineal pain: Secondary | ICD-10-CM | POA: Insufficient documentation

## 2020-01-03 DIAGNOSIS — I10 Essential (primary) hypertension: Secondary | ICD-10-CM

## 2020-01-03 LAB — POCT URINALYSIS DIP (CLINITEK)
Bilirubin, UA: NEGATIVE
Blood, UA: NEGATIVE
Glucose, UA: NEGATIVE mg/dL
Ketones, POC UA: NEGATIVE mg/dL
Leukocytes, UA: NEGATIVE
Nitrite, UA: NEGATIVE
POC PROTEIN,UA: NEGATIVE
Spec Grav, UA: 1.015 (ref 1.010–1.025)
Urobilinogen, UA: 0.2 E.U./dL
pH, UA: 6.5 (ref 5.0–8.0)

## 2020-01-03 LAB — POCT GLYCOSYLATED HEMOGLOBIN (HGB A1C): HbA1c, POC (controlled diabetic range): 6.3 % (ref 0.0–7.0)

## 2020-01-03 LAB — GLUCOSE, POCT (MANUAL RESULT ENTRY): POC Glucose: 120 mg/dl — AB (ref 70–99)

## 2020-01-03 NOTE — Progress Notes (Signed)
Needles pain in feet and ankles.  Shooting pain from vaginal area up abdomen.

## 2020-01-03 NOTE — Progress Notes (Signed)
Subjective:  Patient ID: Stacy Bryan, female    DOB: July 29, 1955  Age: 64 y.o. MRN: [id number]  CC: Diabetes   HPI Stacy Bryan is a 64 year old female with a history of , Type 2 DM (A1c 6.2), Hypertension, chronic low back pain, hypothyroidism (secondary to radioactive treatment for multinodular goiter), chronic venous insufficiency, vertigohere for chronic disease management. At her last office visit she was commenced on Metformin after a new diagnosis of diabetes mellitus with A1c of 7.2 and today her A1c is down to 6.2. Doing well on Metformin. She has had pins and needles in hands and feet for a while now but states it just became more severe. She'd rather not take any medication for it.  She has noticed shooting pain from her vagina to her abdomen and sometimes causes her to bend over. Usually happens when she is in the upright position but last night it occurred while in bed. It lasts for a minute then resolves and has been present for the last month. She is currently not sexually active, denies urinary symptoms, vaginal discharge, abdominal pain. Hypothyroidism is managed by endocrine and her vertigo is controlled. She is doing well on her antihypertensive. Past Medical History:  Diagnosis Date  . Abnormal Pap smear   . Allergy   . Anemia   . GERD (gastroesophageal reflux disease)   . Hyperlipidemia   . Hypertension   . Thyroid disease   . Vertigo     Past Surgical History:  Procedure Laterality Date  . ABDOMINAL HYSTERECTOMY  1997   total  . FOOT SURGERY     both feet  20 yrs ago  . OVARY SURGERY  1980's   removal of R ovary (cyst)    Family History  Problem Relation Age of Onset  . Hypertension Mother   . Heart disease Father   . Asthma Father   . Hyperlipidemia Father   . Asthma Sister   . Breast cancer Sister   . Stomach cancer Sister   . Thyroid disease Sister        Surgery  . Heart disease Sister   . Diabetes Sister   . Gout Sister     . Diabetes Sister   . Ulcers Sister   . Cancer Maternal Grandmother   . Colon cancer Neg Hx   . Esophageal cancer Neg Hx   . Liver cancer Neg Hx   . Pancreatic cancer Neg Hx   . Rectal cancer Neg Hx     No Known Allergies  Outpatient Medications Prior to Visit  Medication Sig Dispense Refill  . aspirin EC 81 MG tablet Take 81 mg by mouth daily.    Marland Kitchen atorvastatin (LIPITOR) 20 MG tablet Take 1 tablet (20 mg total) by mouth daily. 90 tablet 1  . Blood Glucose Monitoring Suppl (ACCU-CHEK GUIDE ME) w/Device KIT Use daily 1 kit 0  . calcium carbonate (TITRALAC) 420 MG CHEW chewable tablet Chew by mouth.    . Cholecalciferol (VITAMIN D-3 PO) Take 400 Units by mouth daily.     . cyclobenzaprine (FLEXERIL) 10 MG tablet Take 1 tablet (10 mg total) by mouth 2 (two) times daily as needed for muscle spasms. 60 tablet 1  . EUTHYROX 75 MCG tablet TAKE 1 TABLET BY MOUTH ONCE DAILY BEFORE BREAKFAST 90 tablet 1  . fish oil-omega-3 fatty acids 1000 MG capsule Take 2 g by mouth daily.    Marland Kitchen glucose blood (ACCU-CHEK GUIDE) test strip Use as instructed daily 100  each 3  . hydrochlorothiazide (MICROZIDE) 12.5 MG capsule Take 1 capsule (12.5 mg total) by mouth daily. 90 capsule 1  . magnesium oxide (MAG-OX) 400 MG tablet Take 400 mg by mouth daily.     . meclizine (ANTIVERT) 25 MG tablet Take 1 tablet (25 mg total) by mouth 3 (three) times daily as needed for dizziness. 60 tablet 1  . meloxicam (MOBIC) 15 MG tablet Take 1 tablet (15 mg total) by mouth daily. 30 tablet 3  . metFORMIN (GLUCOPHAGE) 500 MG tablet Take 1 tablet (500 mg total) by mouth 2 (two) times daily with a meal. 180 tablet 1  . OVER THE COUNTER MEDICATION Chlor Tab, two tablets daily for allergies.    Marland Kitchen diclofenac Sodium (VOLTAREN) 1 % GEL Apply 4 g topically 4 (four) times daily as needed. (Patient not taking: Reported on 09/30/2019) 500 g 6   Facility-Administered Medications Prior to Visit  Medication Dose Route Frequency Provider Last  Rate Last Admin  . 0.9 %  sodium chloride infusion  500 mL Intravenous Once Armbruster, Carlota Raspberry, MD         ROS Review of Systems  Constitutional: Negative for activity change, appetite change and fatigue.  HENT: Negative for congestion, sinus pressure and sore throat.   Eyes: Negative for visual disturbance.  Respiratory: Negative for cough, chest tightness, shortness of breath and wheezing.   Cardiovascular: Negative for chest pain and palpitations.  Gastrointestinal: Negative for abdominal distention, abdominal pain and constipation.  Endocrine: Negative for polydipsia.  Genitourinary: Positive for vaginal pain. Negative for dysuria and frequency.  Musculoskeletal: Negative for arthralgias and back pain.  Skin: Negative for rash.  Neurological: Positive for numbness. Negative for tremors and light-headedness.  Hematological: Does not bruise/bleed easily.  Psychiatric/Behavioral: Negative for agitation and behavioral problems.    Objective:  BP 137/78   Pulse (!) 57   Ht '5\' 10"'  (1.778 m)   Wt 216 lb (98 kg)   SpO2 100%   BMI 30.99 kg/m   BP/Weight 01/03/2020 3/79/4327 09/14/4707  Systolic BP 295 747 340  Diastolic BP 78 78 91  Wt. (Lbs) 216 223 220  BMI 30.99 32 31.57      Physical Exam Constitutional:      Appearance: She is well-developed.  Neck:     Vascular: No JVD.  Cardiovascular:     Rate and Rhythm: Bradycardia present.     Heart sounds: Normal heart sounds. No murmur heard.   Pulmonary:     Effort: Pulmonary effort is normal.     Breath sounds: Normal breath sounds. No wheezing or rales.  Chest:     Chest wall: No tenderness.  Abdominal:     General: Bowel sounds are normal. There is no distension.     Palpations: Abdomen is soft. There is no mass.     Tenderness: There is no abdominal tenderness.  Musculoskeletal:        General: Normal range of motion.     Right lower leg: Edema (1+) present.     Left lower leg: No edema (1+).  Neurological:      Mental Status: She is alert and oriented to person, place, and time.  Psychiatric:        Mood and Affect: Mood normal.     CMP Latest Ref Rng & Units 09/30/2019 08/27/2018 03/05/2018  Glucose 65 - 99 mg/dL 130(H) 113(H) 107(H)  BUN 8 - 27 mg/dL '13 14 16  ' Creatinine 0.57 - 1.00 mg/dL 0.81 0.91 0.87  Sodium 134 - 144 mmol/L 139 141 141  Potassium 3.5 - 5.2 mmol/L 4.3 4.2 3.6  Chloride 96 - 106 mmol/L 100 102 108  CO2 20 - 29 mmol/L '27 21 24  ' Calcium 8.7 - 10.3 mg/dL 9.8 9.6 9.3  Total Protein 6.0 - 8.5 g/dL 7.4 7.3 7.7  Total Bilirubin 0.0 - 1.2 mg/dL 0.3 0.2 0.6  Alkaline Phos 48 - 121 IU/L 106 104 82  AST 0 - 40 IU/L '21 18 20  ' ALT 0 - 32 IU/L '23 25 25    ' Lipid Panel     Component Value Date/Time   CHOL 224 (H) 09/30/2019 1050   TRIG 207 (H) 09/30/2019 1050   HDL 50 09/30/2019 1050   CHOLHDL 4.5 (H) 09/30/2019 1050   CHOLHDL 4.8 10/08/2013 1130   VLDL 35 10/08/2013 1130   LDLCALC 137 (H) 09/30/2019 1050    CBC    Component Value Date/Time   WBC 5.0 03/05/2018 1424   RBC 5.14 (H) 03/05/2018 1424   HGB 14.2 03/05/2018 1424   HGB 12.6 02/26/2017 1113   HCT 44.3 03/05/2018 1424   HCT 38.0 02/26/2017 1113   PLT 249 03/05/2018 1424   PLT 247 02/26/2017 1113   MCV 86.2 03/05/2018 1424   MCV 83 02/26/2017 1113   MCH 27.6 03/05/2018 1424   MCHC 32.1 03/05/2018 1424   RDW 13.2 03/05/2018 1424   RDW 14.4 02/26/2017 1113   LYMPHSABS 1.5 03/05/2018 1424   LYMPHSABS 1.8 02/26/2017 1113   MONOABS 0.4 03/05/2018 1424   EOSABS 0.2 03/05/2018 1424   EOSABS 0.2 02/26/2017 1113   BASOSABS 0.0 03/05/2018 1424   BASOSABS 0.0 02/26/2017 1113    Lab Results  Component Value Date   HGBA1C 6.3 01/03/2020    Lab Results  Component Value Date   TSH 2.64 07/19/2019    Assessment & Plan:  1. Type 2 diabetes mellitus with other neurologic complication, without long-term current use of insulin (HCC) Controlled with A1c of 6.3 which has improved from 7.2 previously; goal is  less than 7.0 Continue current regimen Counseled on Diabetic diet, my plate method, 889 minutes of moderate intensity exercise/week Blood sugar logs with fasting goals of 80-120 mg/dl, random of less than 180 and in the event of sugars less than 60 mg/dl or greater than 400 mg/dl encouraged to notify the clinic. Advised on the need for annual eye exams, annual foot exams, Pneumonia vaccine. - POCT glucose (manual entry) - POCT glycosylated hemoglobin (Hb A1C) - POCT URINALYSIS DIP (CLINITEK)  2. Pelvic pain Given complaint of vaginal pain will need to exclude UTI and vaginal infection UA is negative for UTI - Cervicovaginal ancillary only  3. Hypothyroidism, postablative Currently on levothyroxine Management as per endocrine  4. Vertigo Controlled Use meclizine as needed  5. HTN (hypertension), benign Controlled Continue current regimen Counseled on blood pressure goal of less than 130/80, low-sodium, DASH diet, medication compliance, 150 minutes of moderate intensity exercise per week. Discussed medication compliance, adverse effects.   No orders of the defined types were placed in this encounter.   Follow-up: Return in about 6 months (around 07/02/2020) for chronic disease management.       Charlott Rakes, MD, FAAFP. [facility] and [place], [place]   01/03/2020, 10:03 AM

## 2020-01-04 ENCOUNTER — Other Ambulatory Visit: Payer: Self-pay | Admitting: Family Medicine

## 2020-01-04 ENCOUNTER — Encounter: Payer: Self-pay | Admitting: Family Medicine

## 2020-01-04 LAB — CERVICOVAGINAL ANCILLARY ONLY
Bacterial Vaginitis (gardnerella): POSITIVE — AB
Candida Glabrata: NEGATIVE
Candida Vaginitis: NEGATIVE
Chlamydia: NEGATIVE
Comment: NEGATIVE
Comment: NEGATIVE
Comment: NEGATIVE
Comment: NEGATIVE
Comment: NEGATIVE
Comment: NORMAL
Neisseria Gonorrhea: NEGATIVE
Trichomonas: NEGATIVE

## 2020-01-04 MED ORDER — METRONIDAZOLE 0.75 % VA GEL: 1.0000 | Freq: Every day | VAGINAL | 0 refills | Status: DC

## 2020-01-04 MED ORDER — METRONIDAZOLE 500 MG PO TABS
500.0000 mg | ORAL_TABLET | Freq: Two times a day (BID) | ORAL | 0 refills | Status: DC
Start: 2020-01-04 — End: 2022-10-25

## 2020-03-20 ENCOUNTER — Other Ambulatory Visit: Payer: Self-pay | Admitting: Family Medicine

## 2020-03-20 DIAGNOSIS — Z1231 Encounter for screening mammogram for malignant neoplasm of breast: Secondary | ICD-10-CM

## 2020-03-23 ENCOUNTER — Other Ambulatory Visit: Payer: Self-pay | Admitting: Family Medicine

## 2020-03-23 DIAGNOSIS — G8929 Other chronic pain: Secondary | ICD-10-CM

## 2020-04-18 ENCOUNTER — Encounter: Payer: Self-pay | Admitting: Physician Assistant

## 2020-04-18 ENCOUNTER — Ambulatory Visit: Payer: 59 | Attending: Physician Assistant | Admitting: Physician Assistant

## 2020-04-18 ENCOUNTER — Other Ambulatory Visit: Payer: Self-pay

## 2020-04-18 VITALS — BP 134/86 | HR 69 | Temp 98.4°F | Resp 16 | Wt 212.6 lb

## 2020-04-18 DIAGNOSIS — H6981 Other specified disorders of Eustachian tube, right ear: Secondary | ICD-10-CM | POA: Diagnosis not present

## 2020-04-18 DIAGNOSIS — I10 Essential (primary) hypertension: Secondary | ICD-10-CM | POA: Diagnosis not present

## 2020-04-18 DIAGNOSIS — E890 Postprocedural hypothyroidism: Secondary | ICD-10-CM

## 2020-04-18 DIAGNOSIS — H6991 Unspecified Eustachian tube disorder, right ear: Secondary | ICD-10-CM

## 2020-04-18 DIAGNOSIS — R5383 Other fatigue: Secondary | ICD-10-CM

## 2020-04-18 DIAGNOSIS — E1149 Type 2 diabetes mellitus with other diabetic neurological complication: Secondary | ICD-10-CM

## 2020-04-18 LAB — GLUCOSE, POCT (MANUAL RESULT ENTRY): POC Glucose: 130 mg/dl — AB (ref 70–99)

## 2020-04-18 MED ORDER — FLUTICASONE PROPIONATE 50 MCG/ACT NA SUSP: 2.0000 | Freq: Every day | NASAL | 6 refills | Status: DC

## 2020-04-18 MED ORDER — METHYLPREDNISOLONE SODIUM SUCC 125 MG IJ SOLR
125.0000 mg | Freq: Once | INTRAMUSCULAR | Status: AC
  Administered 2020-04-18: 125 mg via INTRAMUSCULAR

## 2020-04-18 NOTE — Patient Instructions (Signed)
Eustachian Tube Dysfunction ° °Eustachian tube dysfunction refers to a condition in which a blockage develops in the narrow passage that connects the middle ear to the back of the nose (eustachian tube). The eustachian tube regulates air pressure in the middle ear by letting air move between the ear and nose. It also helps to drain fluid from the middle ear space. °Eustachian tube dysfunction can affect one or both ears. When the eustachian tube does not function properly, air pressure, fluid, or both can build up in the middle ear. °What are the causes? °This condition occurs when the eustachian tube becomes blocked or cannot open normally. Common causes of this condition include: °· Ear infections. °· Colds and other infections that affect the nose, mouth, and throat (upper respiratory tract). °· Allergies. °· Irritation from cigarette smoke. °· Irritation from stomach acid coming up into the esophagus (gastroesophageal reflux). The esophagus is the tube that carries food from the mouth to the stomach. °· Sudden changes in air pressure, such as from descending in an airplane or scuba diving. °· Abnormal growths in the nose or throat, such as: °? Growths that line the nose (nasal polyps). °? Abnormal growth of cells (tumors). °? Enlarged tissue at the back of the throat (adenoids). °What increases the risk? °You are more likely to develop this condition if: °· You smoke. °· You are overweight. °· You are a child who has: °? Certain birth defects of the mouth, such as cleft palate. °? Large tonsils or adenoids. °What are the signs or symptoms? °Common symptoms of this condition include: °· A feeling of fullness in the ear. °· Ear pain. °· Clicking or popping noises in the ear. °· Ringing in the ear. °· Hearing loss. °· Loss of balance. °· Dizziness. °Symptoms may get worse when the air pressure around you changes, such as when you travel to an area of high elevation, fly on an airplane, or go scuba diving. °How is  this diagnosed? °This condition may be diagnosed based on: °· Your symptoms. °· A physical exam of your ears, nose, and throat. °· Tests, such as those that measure: °? The movement of your eardrum (tympanogram). °? Your hearing (audiometry). °How is this treated? °Treatment depends on the cause and severity of your condition. °· In mild cases, you may relieve your symptoms by moving air into your ears. This is called "popping the ears." °· In more severe cases, or if you have symptoms of fluid in your ears, treatment may include: °? Medicines to relieve congestion (decongestants). °? Medicines that treat allergies (antihistamines). °? Nasal sprays or ear drops that contain medicines that reduce swelling (steroids). °? A procedure to drain the fluid in your eardrum (myringotomy). In this procedure, a small tube is placed in the eardrum to: °§ Drain the fluid. °§ Restore the air in the middle ear space. °? A procedure to insert a balloon device through the nose to inflate the opening of the eustachian tube (balloon dilation). °Follow these instructions at home: °Lifestyle °· Do not do any of the following until your health care provider approves: °? Travel to high altitudes. °? Fly in airplanes. °? Work in a pressurized cabin or room. °? Scuba dive. °· Do not use any products that contain nicotine or tobacco, such as cigarettes and e-cigarettes. If you need help quitting, ask your health care provider. °· Keep your ears dry. Wear fitted earplugs during showering and bathing. Dry your ears completely after. °General instructions °· Take over-the-counter   and prescription medicines only as told by your health care provider. °· Use techniques to help pop your ears as recommended by your health care provider. These may include: °? Chewing gum. °? Yawning. °? Frequent, forceful swallowing. °? Closing your mouth, holding your nose closed, and gently blowing as if you are trying to blow air out of your nose. °· Keep all  follow-up visits as told by your health care provider. This is important. °Contact a health care provider if: °· Your symptoms do not go away after treatment. °· Your symptoms come back after treatment. °· You are unable to pop your ears. °· You have: °? A fever. °? Pain in your ear. °? Pain in your head or neck. °? Fluid draining from your ear. °· Your hearing suddenly changes. °· You become very dizzy. °· You lose your balance. °Summary °· Eustachian tube dysfunction refers to a condition in which a blockage develops in the eustachian tube. °· It can be caused by ear infections, allergies, inhaled irritants, or abnormal growths in the nose or throat. °· Symptoms include ear pain, hearing loss, or ringing in the ears. °· Mild cases are treated with maneuvers to unblock the ears, such as yawning or ear popping. °· Severe cases are treated with medicines. Surgery may also be done (rare). °This information is not intended to replace advice given to you by your health care provider. Make sure you discuss any questions you have with your health care provider. °Document Revised: 07/22/2017 Document Reviewed: 07/22/2017 °Elsevier Patient Education © 2020 [employer] ° °

## 2020-04-18 NOTE — Progress Notes (Signed)
Patient ID: Stacy Bryan, female   DOB: [birth year], 65 y.o.   MRN: [id number]     Stacy Bryan, is a 65 y.o. female  FMB:[id number]  RCV:[id number]  DOB - 1955/09/26  Subjective:  Chief Complaint and HPI: Stacy Bryan is a 65 y.o. female here today for Shooting pain in R ear that was constant for about 2 days.  Sounds hollow in R ear. This occurred right before christmas and it's causing her to have HA at times too.  Pain usu comes and goes but was constant for 2 days a couple of weeks ago.  No fever.  No URI s/sx.  No vision changes.  Pain was relieved by tylenol.    Also c/o fatigue now for several months.  Thyroid managed by endocrine.  Levels last checked in April 2021.Last seen by endocrine about 07/2019.   avg glucose usu <100 in the morning; 130-160 in the evening.     ROS:   Constitutional:  No f/c, No night sweats, No unexplained weight loss. EENT:  No vision changes, No blurry vision, No hearing changes. No other mouth, throat, or ear problems.  Respiratory: No cough, No SOB Cardiac: No CP, no palpitations GI:  No abd pain, No N/V/D. GU: No Urinary s/sx Musculoskeletal: No joint pain Neuro: No headache, no dizziness, no motor weakness.  Skin: No rash Endocrine:  No polydipsia. No polyuria.  Psych: Denies SI/HI  No problems updated.  ALLERGIES: No Known Allergies  PAST MEDICAL HISTORY: Past Medical History:  Diagnosis Date  . Abnormal Pap smear   . Allergy   . Anemia   . GERD (gastroesophageal reflux disease)   . Hyperlipidemia   . Hypertension   . Thyroid disease   . Vertigo     MEDICATIONS AT HOME: Prior to Admission medications   Medication Sig Start Date End Date Taking? Authorizing Provider  fluticasone (FLONASE) 50 MCG/ACT nasal spray Place 2 sprays into both nostrils daily. 04/18/20  Yes Argentina Donovan, PA-C  aspirin EC 81 MG tablet Take 81 mg by mouth daily.    [provider]  atorvastatin (LIPITOR) 20 MG tablet Take 1 tablet (20  mg total) by mouth daily. 09/30/19   Charlott Rakes, MD  Blood Glucose Monitoring Suppl (ACCU-CHEK GUIDE ME) w/Device KIT Use daily 09/30/19   Charlott Rakes, MD  calcium carbonate (TITRALAC) 420 MG CHEW chewable tablet Chew by mouth.    [provider]  Cholecalciferol (VITAMIN D-3 PO) Take 400 Units by mouth daily.     [provider]  cyclobenzaprine (FLEXERIL) 10 MG tablet Take 1 tablet (10 mg total) by mouth 2 (two) times daily as needed for muscle spasms. 09/30/19   Charlott Rakes, MD  diclofenac Sodium (VOLTAREN) 1 % GEL Apply 4 g topically 4 (four) times daily as needed. Patient not taking: Reported on 09/30/2019 06/08/19   Hilts, Legrand Como, MD  EUTHYROX 75 MCG tablet TAKE 1 TABLET BY MOUTH ONCE DAILY BEFORE BREAKFAST 11/08/19   Elayne Snare, MD  fish oil-omega-3 fatty acids 1000 MG capsule Take 2 g by mouth daily.    [provider]  glucose blood (ACCU-CHEK GUIDE) test strip Use as instructed daily 09/30/19   Charlott Rakes, MD  hydrochlorothiazide (MICROZIDE) 12.5 MG capsule Take 1 capsule (12.5 mg total) by mouth daily. 09/30/19   Charlott Rakes, MD  magnesium oxide (MAG-OX) 400 MG tablet Take 400 mg by mouth daily.     [provider]  meclizine (ANTIVERT) 25 MG tablet Take 1  tablet (25 mg total) by mouth 3 (three) times daily as needed for dizziness. 09/30/19   Charlott Rakes, MD  meloxicam (MOBIC) 15 MG tablet Take 1 tablet by mouth once daily 03/23/20   Charlott Rakes, MD  metFORMIN (GLUCOPHAGE) 500 MG tablet Take 1 tablet (500 mg total) by mouth 2 (two) times daily with a meal. 09/30/19   Charlott Rakes, MD  metroNIDAZOLE (FLAGYL) 500 MG tablet Take 1 tablet (500 mg total) by mouth 2 (two) times daily. 01/04/20   Charlott Rakes, MD  metroNIDAZOLE (METROGEL VAGINAL) 0.75 % vaginal gel Place 1 Applicatorful vaginally at bedtime. 01/04/20   Charlott Rakes, MD  OVER THE COUNTER MEDICATION Chlor Tab, two tablets daily for allergies.    [provider]     Objective:  EXAM:   Vitals:   04/18/20 1619  BP: 134/86  Pulse: 69  Resp: 16  Temp: 98.4 F (36.9 C)  SpO2: 97%  Weight: 212 lb 9.6 oz (96.4 kg)    General appearance : A&OX3. NAD. Non-toxic-appearing HEENT: Atraumatic and Normocephalic.  PERRLA. EOM intact.  RTM bulging with distorted light reflex.  No erythema.  R eustachian tube TTP.  LTM WNL. Neck: supple, no JVD. No cervical lymphadenopathy. No thyromegaly Chest/Lungs:  Breathing-non-labored, Good air entry bilaterally, breath sounds normal without rales, rhonchi, or wheezing  CVS: S1 S2 regular, no murmurs, gallops, rubs  Extremities: Bilateral Lower Ext shows no edema, both legs are warm to touch with = pulse throughout Neurology:  CN II-XII grossly intact, Non focal.   Psych:  TP linear. J/I WNL. Normal speech. Appropriate eye contact and affect.  Skin:  No Rash  Data Review Lab Results  Component Value Date   HGBA1C 6.3 01/03/2020   HGBA1C 7.2 (A) 09/30/2019   HGBA1C 6.2 02/26/2017     Assessment & Plan   1. Type 2 diabetes mellitus with other neurologic complication, without long-term current use of insulin (HCC) Not at goal but no recent hyperglycemia.  Await a1c-no changes today - Glucose (CBG) - Comprehensive metabolic panel - Hemoglobin A1c  2. Hypothyroidism, postablative - Thyroid Panel With TSH  3. HTN (hypertension), benign Continue current regimen  4. Dysfunction of right eustachian tube - methylPREDNISolone sodium succinate (SOLU-MEDROL) 125 mg/2 mL injection 125 mg - fluticasone (FLONASE) 50 MCG/ACT nasal spray; Place 2 sprays into both nostrils daily.  Dispense: 16 g; Refill: 6 - Ambulatory referral to ENT(may cancel appt if doing better by then)  5. Fatigue, unspecified type - Thyroid Panel With TSH - Vitamin D, 25-hydroxy  Patient have been counseled extensively about nutrition and exercise  Return in about 3 months (around 07/17/2020).  The patient was given clear  instructions to go to ER or return to medical center if symptoms don't improve, worsen or new problems develop. The patient verbalized understanding. The patient was told to call to get lab results if they haven't heard anything in the next week.     Freeman Caldron, PA-C [facility] and [place], [place]   04/18/2020, 4:48 PM

## 2020-04-19 ENCOUNTER — Other Ambulatory Visit: Payer: Self-pay | Admitting: Physician Assistant

## 2020-04-19 DIAGNOSIS — E1169 Type 2 diabetes mellitus with other specified complication: Secondary | ICD-10-CM

## 2020-04-19 LAB — COMPREHENSIVE METABOLIC PANEL
ALT: 19 IU/L (ref 0–32)
AST: 13 IU/L (ref 0–40)
Albumin/Globulin Ratio: 1.5 (ref 1.2–2.2)
Albumin: 4.2 g/dL (ref 3.8–4.8)
Alkaline Phosphatase: 95 IU/L (ref 44–121)
BUN/Creatinine Ratio: 18 (ref 12–28)
BUN: 16 mg/dL (ref 8–27)
Bilirubin Total: 0.3 mg/dL (ref 0.0–1.2)
CO2: 26 mmol/L (ref 20–29)
Calcium: 9.4 mg/dL (ref 8.7–10.3)
Chloride: 104 mmol/L (ref 96–106)
Creatinine, Ser: 0.91 mg/dL (ref 0.57–1.00)
GFR calc Af Amer: 76 mL/min/{1.73_m2} (ref >59)
GFR calc non Af Amer: 66 mL/min/{1.73_m2} (ref >59)
Globulin, Total: 2.8 g/dL (ref 1.5–4.5)
Glucose: 125 mg/dL — ABNORMAL HIGH (ref 65–99)
Potassium: 3.9 mmol/L (ref 3.5–5.2)
Sodium: 140 mmol/L (ref 134–144)
Total Protein: 7 g/dL (ref 6.0–8.5)

## 2020-04-19 LAB — THYROID PANEL WITH TSH
Free Thyroxine Index: 1.6 (ref 1.2–4.9)
T3 Uptake Ratio: 24 % (ref 24–39)
T4, Total: 6.8 ug/dL (ref 4.5–12.0)
TSH: 3.27 u[IU]/mL (ref 0.450–4.500)

## 2020-04-19 LAB — VITAMIN D 25 HYDROXY (VIT D DEFICIENCY, FRACTURES): Vit D, 25-Hydroxy: 19.1 ng/mL — ABNORMAL LOW (ref 30.0–100.0)

## 2020-04-19 LAB — HEMOGLOBIN A1C
Est. average glucose Bld gHb Est-mCnc: 140 mg/dL
Hgb A1c MFr Bld: 6.5 % — ABNORMAL HIGH (ref 4.8–5.6)

## 2020-04-19 MED ORDER — VITAMIN D (ERGOCALCIFEROL) 1.25 MG (50000 UNIT) PO CAPS: 50000.0000 [IU] | ORAL_CAPSULE | ORAL | 0 refills | Status: AC

## 2020-04-19 MED ORDER — METFORMIN HCL 500 MG PO TABS: 1000.0000 mg | ORAL_TABLET | Freq: Two times a day (BID) | ORAL | 1 refills | Status: DC

## 2020-04-26 ENCOUNTER — Telehealth: Payer: Self-pay | Admitting: Family Medicine

## 2020-04-26 NOTE — Telephone Encounter (Signed)
Patient called to review her latestest lab results she was read lab not by Freeman Caldron written 04/19/20.  She verbalized understanding but states she would like further explanation of increase in Metformin.  Note will be routed to office.

## 2020-04-27 NOTE — Telephone Encounter (Signed)
Pt is wanting to know why her Metformin has been increased.  Does pt need virtual visit to discuss this matter.

## 2020-04-27 NOTE — Telephone Encounter (Signed)
Can you please address this with the patient? Thanks

## 2020-04-27 NOTE — Telephone Encounter (Signed)
Please call patient.  She is asking why I increased her metformin dose.  Her last A1C was 6.3; her most recent A1C was 6.5 which shows me her blood sugars are running higher than they were 3-6 months ago; therefore I increased the dose of metformin to better control her sugars.  If she does not want to increase the dose, she can stay at the same dose she was on and work more on eliminating sugars and white carbs from her diet and we can recheck it in 3 months.  Either plan will be fine.  Let me know which she decides to do please. Thanks,  Freeman Caldron, PA-C

## 2020-05-02 ENCOUNTER — Ambulatory Visit: Payer: 59

## 2020-05-02 NOTE — Telephone Encounter (Signed)
Attempt to reach patient to inform on provider advising. No answer and VM has not been set up.

## 2020-05-15 ENCOUNTER — Encounter: Payer: Self-pay | Admitting: Family Medicine

## 2020-05-16 ENCOUNTER — Encounter: Payer: Self-pay | Admitting: Family Medicine

## 2020-05-17 ENCOUNTER — Other Ambulatory Visit: Payer: Self-pay | Admitting: Family Medicine

## 2020-05-17 DIAGNOSIS — E2839 Other primary ovarian failure: Secondary | ICD-10-CM

## 2020-05-18 ENCOUNTER — Other Ambulatory Visit: Payer: Self-pay | Admitting: Family Medicine

## 2020-05-18 DIAGNOSIS — E2839 Other primary ovarian failure: Secondary | ICD-10-CM

## 2020-05-22 ENCOUNTER — Ambulatory Visit
Admission: RE | Admit: 2020-05-22 | Discharge: 2020-05-22 | Disposition: A | Discharge status: Home / Self Care | Payer: 59 | Source: Ambulatory Visit | Attending: Family Medicine | Admitting: Family Medicine

## 2020-05-22 ENCOUNTER — Other Ambulatory Visit: Payer: Self-pay

## 2020-05-22 DIAGNOSIS — Z1231 Encounter for screening mammogram for malignant neoplasm of breast: Secondary | ICD-10-CM

## 2020-07-03 ENCOUNTER — Ambulatory Visit: Payer: 59 | Admitting: Family Medicine

## 2020-07-26 ENCOUNTER — Ambulatory Visit: Payer: 59 | Admitting: Family Medicine

## 2020-07-27 ENCOUNTER — Telehealth: Payer: Self-pay | Admitting: Family Medicine

## 2020-07-27 NOTE — Telephone Encounter (Signed)
Received a fax for Patient from [facility]. Form will be placed in PCP box

## 2020-07-31 ENCOUNTER — Other Ambulatory Visit: Payer: Self-pay | Admitting: Family Medicine

## 2020-07-31 ENCOUNTER — Other Ambulatory Visit: Payer: Self-pay | Admitting: Endocrinology

## 2020-07-31 DIAGNOSIS — G8929 Other chronic pain: Secondary | ICD-10-CM

## 2020-07-31 DIAGNOSIS — M545 Low back pain, unspecified: Secondary | ICD-10-CM

## 2020-07-31 NOTE — Telephone Encounter (Signed)
Requested medication (s) are due for refill today: yes  Requested medication (s) are on the active medication list: yes  Future visit scheduled: no  Notes to clinic:  Last 2 appointments were canceled by provider  Due for follow up on 07/17/2020 Review for refill    Requested Prescriptions  Pending Prescriptions Disp Refills   meloxicam (MOBIC) 15 MG tablet [Pharmacy Med Name: Meloxicam 15 MG Oral Tablet] 90 tablet 0    Sig: Take 1 tablet by mouth once daily      Analgesics:  COX2 Inhibitors Failed - 07/31/2020  5:30 AM      Failed - HGB in normal range and within 360 days    Hemoglobin  Date Value Ref Range Status  03/05/2018 14.2 12.0 - 15.0 g/dL Final  02/26/2017 12.6 11.1 - 15.9 g/dL Final          Passed - Cr in normal range and within 360 days    Creat  Date Value Ref Range Status  10/08/2013 0.87 0.50 - 1.10 mg/dL Final   Creatinine, Ser  Date Value Ref Range Status  04/18/2020 0.91 0.57 - 1.00 mg/dL Final          Passed - Patient is not pregnant      Passed - Valid encounter within last 12 months    Recent Outpatient Visits           3 months ago Type 2 diabetes mellitus with other neurologic complication, without long-term current use of insulin ([place])   [place], [place], [place]   7 months ago Type 2 diabetes mellitus with other neurologic complication, without long-term current use of insulin ([place])   [place], Prue, MD   10 months ago Type 2 diabetes mellitus with other specified complication, without long-term current use of insulin ([place])   [place], Enobong, MD   1 year ago Acute intractable headache, unspecified headache type   [place], [place]   1 year ago Acute pharyngitis, unspecified etiology   Sophia Kerin Perna, NP

## 2020-08-02 NOTE — Telephone Encounter (Signed)
FMLA is for my Vertigo, dates are for 08/26/2020 - 08/27/2021. Episodes can be 1 day sometimes 5 or 6 days, I never know.   Thank you  I will give you the paperwork on Friday morning.

## 2020-08-09 ENCOUNTER — Encounter: Payer: Self-pay | Admitting: Family Medicine

## 2020-08-16 ENCOUNTER — Other Ambulatory Visit: Payer: Self-pay | Admitting: Physician Assistant

## 2020-08-16 NOTE — Telephone Encounter (Signed)
Requested medications are due for refill today.  yes  Requested medications are on the active medications list.  yes  Last refill. 04/19/2020  Future visit scheduled.   no  Notes to clinic.  Medication not delegated.

## 2020-09-03 ENCOUNTER — Other Ambulatory Visit: Payer: Self-pay | Admitting: Family Medicine

## 2020-09-03 DIAGNOSIS — E1169 Type 2 diabetes mellitus with other specified complication: Secondary | ICD-10-CM

## 2020-09-03 NOTE — Telephone Encounter (Signed)
Requested Prescriptions  Pending Prescriptions Disp Refills  . atorvastatin (LIPITOR) 20 MG tablet [Pharmacy Med Name: Atorvastatin Calcium 20 MG Oral Tablet] 30 tablet 0    Sig: Take 1 tablet by mouth once daily     Cardiovascular:  Antilipid - Statins Failed - 09/03/2020  6:06 PM      Failed - Total Cholesterol in normal range and within 360 days    Cholesterol, Total  Date Value Ref Range Status  09/30/2019 224 (H) 100 - 199 mg/dL Final         Failed - LDL in normal range and within 360 days    LDL Chol Calc (NIH)  Date Value Ref Range Status  09/30/2019 137 (H) 0 - 99 mg/dL Final         Failed - Triglycerides in normal range and within 360 days    Triglycerides  Date Value Ref Range Status  09/30/2019 207 (H) 0 - 149 mg/dL Final         Passed - HDL in normal range and within 360 days    HDL  Date Value Ref Range Status  09/30/2019 50 >39 mg/dL Final         Passed - Patient is not pregnant      Passed - Valid encounter within last 12 months    Recent Outpatient Visits          4 months ago Type 2 diabetes mellitus with other neurologic complication, without long-term current use of insulin [facility])   [place], [place], [place]   8 months ago Type 2 diabetes mellitus with other neurologic complication, without long-term current use of insulin ([place])   [place], [place], MD   11 months ago Type 2 diabetes mellitus with other specified complication, without long-term current use of insulin ([place])   [place], Enobong, MD   1 year ago Acute intractable headache, unspecified headache type   [place], [place], [place]   1 year ago Acute pharyngitis, unspecified etiology   [place], Michelle P, NP

## 2020-09-18 ENCOUNTER — Ambulatory Visit: Payer: Self-pay | Admitting: *Deleted

## 2020-09-18 ENCOUNTER — Telehealth: Payer: 59 | Admitting: Physician Assistant

## 2020-09-18 ENCOUNTER — Encounter: Payer: Self-pay | Admitting: Physician Assistant

## 2020-09-18 DIAGNOSIS — U071 COVID-19: Secondary | ICD-10-CM | POA: Diagnosis not present

## 2020-09-18 MED ORDER — GUAIFENESIN ER 600 MG PO TB12: 600.0000 mg | ORAL_TABLET | Freq: Two times a day (BID) | ORAL | 0 refills | Status: DC

## 2020-09-18 MED ORDER — BENZONATATE 100 MG PO CAPS: 100.0000 mg | ORAL_CAPSULE | Freq: Three times a day (TID) | ORAL | 0 refills | Status: DC

## 2020-09-18 MED ORDER — MOLNUPIRAVIR EUA 200MG CAPSULE: 4.0000 | ORAL_CAPSULE | Freq: Two times a day (BID) | ORAL | 0 refills | Status: DC

## 2020-09-18 NOTE — Telephone Encounter (Signed)
Please add her to the schedule for tomorrow morning - virtual.  Thank you

## 2020-09-18 NOTE — Progress Notes (Signed)
Stacy Bryan, Stacy Bryan are scheduled for a virtual visit with your provider today.    Just as we do with appointments in the office, we must obtain your consent to participate.  Your consent will be active for this visit and any virtual visit you may have with one of our providers in the next 365 days.    If you have a MyChart account, I can also send a copy of this consent to you electronically.  All virtual visits are billed to your insurance company just like a traditional visit in the office.  As this is a virtual visit, video technology does not allow for your provider to perform a traditional examination.  This may limit your provider's ability to fully assess your condition.  If your provider identifies any concerns that need to be evaluated in person or the need to arrange testing such as labs, EKG, etc, we will make arrangements to do so.    Although advances in technology are sophisticated, we cannot ensure that it will always work on either your end or our end.  If the connection with a video visit is poor, we may have to switch to a telephone visit.  With either a video or telephone visit, we are not always able to ensure that we have a secure connection.   I need to obtain your verbal consent now.   Are you willing to proceed with your visit today?   Stacy Bryan has provided verbal consent on 09/18/2020 for a virtual visit (video or telephone).   Rodney Booze, PA-C 09/18/2020  3:58 PM   [name],you are scheduled for a virtual visit with your provider today.    Just as we do with appointments in the office, we must obtain your consent to participate.  Your consent will be active for this visit and any virtual visit you may have with one of our providers in the next 365 days.    If you have a MyChart account, I can also send a copy of this consent to you electronically.  All virtual visits are billed to your insurance company just like a traditional visit in the office.  As this is a  virtual visit, video technology does not allow for your provider to perform a traditional examination.  This may limit your provider's ability to fully assess your condition.  If your provider identifies any concerns that need to be evaluated in person or the need to arrange testing such as labs, EKG, etc, we will make arrangements to do so.    Although advances in technology are sophisticated, we cannot ensure that it will always work on either your end or our end.  If the connection with a video visit is poor, we may have to switch to a telephone visit.  With either a video or telephone visit, we are not always able to ensure that we have a secure connection.   I need to obtain your verbal consent now.   Are you willing to proceed with your visit today?   Stacy Bryan has provided verbal consent on 09/18/2020 for a virtual visit (video or telephone).   Rodney Booze, PA-C 09/18/2020  3:59 PM  [name],you are scheduled for a virtual visit with your provider today.    Just as we do with appointments in the office, we must obtain your consent to participate.  Your consent will be active for this visit and any virtual visit you may have with one of  our providers in the next 365 days.    If you have a MyChart account, I can also send a copy of this consent to you electronically.  All virtual visits are billed to your insurance company just like a traditional visit in the office.  As this is a virtual visit, video technology does not allow for your provider to perform a traditional examination.  This may limit your provider's ability to fully assess your condition.  If your provider identifies any concerns that need to be evaluated in person or the need to arrange testing such as labs, EKG, etc, we will make arrangements to do so.    Although advances in technology are sophisticated, we cannot ensure that it will always work on either your end or our end.  If the connection with a video visit is  poor, we may have to switch to a telephone visit.  With either a video or telephone visit, we are not always able to ensure that we have a secure connection.   I need to obtain your verbal consent now.   Are you willing to proceed with your visit today?   [name] has provided verbal consent on 09/18/2020 for a virtual visit (video or telephone).   Rodney Booze, PA-C 09/18/2020  4:20 PM   Date:  09/18/2020   ID:  Stacy Bryan, DOB 06-16-1955, MRN [id number]  Patient Location: Home Provider Location: Home Office   Participants: Patient and Provider for Visit and Wrap up  Method of visit: Video  Location of Patient: Home Location of Provider: Home Office Consent was obtain for visit over the video. Services rendered by provider: Visit was performed via video  A video enabled telemedicine application was used and I verified that I am speaking with the correct person using two identifiers.  PCP:  Charlott Rakes, MD   Chief Complaint:  covid  History of Present Illness:    Stacy Bryan is a 65 y.o. female with history as stated below. Presents video telehealth for an acute care visit  Onset of symptoms was 5 days ago and symptoms have been persistent and include: congestion, body aches, cough, headache diarrhea. Denies chest pain and states she has minimal shortness of breath. States diarrhea is improving and her main concern is the body aches and congestion. She has tried nyquil, dayquil, tylenol, and Alka seltzer  She has been vaccinated against covid but had positive home test a few days ago.  No other aggravating or relieving factors.  No other c/o.  Past Medical, Surgical, Social History, Allergies, and Medications have been Reviewed.  Past Medical History:  Diagnosis Date  . Abnormal Pap smear   . Allergy   . Anemia   . GERD (gastroesophageal reflux disease)   . Hyperlipidemia   . Hypertension   . Thyroid disease   . Vertigo     No  outpatient medications have been marked as taking for the 09/18/20 encounter (Appointment) with Tivis Ringer, Bridgit Eynon S, PA-C.   Current Facility-Administered Medications for the 09/18/20 encounter (Appointment) with Tivis Ringer, Anwen Cannedy S, PA-C  Medication  . 0.9 %  sodium chloride infusion     Allergies:   Patient has no known allergies.   ROS See HPI for history of present illness.  Physical Exam Vitals and nursing note reviewed.  Constitutional:      General: She is not in acute distress.    Appearance: She is well-developed. She is not ill-appearing or toxic-appearing.  HENT:  Head: Normocephalic and atraumatic.  Eyes:     Conjunctiva/sclera: Conjunctivae normal.  Pulmonary:     Effort: Pulmonary effort is normal.     Comments: No tachypnea. Pt is speaking in full sentences without observed dyspnea Musculoskeletal:        General: Normal range of motion.     Cervical back: Neck supple.  Neurological:     Mental Status: She is alert.         MDM: Pt with positive home covid test. sxs appear to be mild at this point but pt does have risk factors for severe disease so will give rx for molnupirovir. Will also give rx for mucinex and tessalon. Advised hydration and close pcp f/u.   There are no diagnoses linked to this encounter.   Time:   Today, I have spent 30 minutes with the patient with telehealth technology discussing the above problems, reviewing the chart, previous notes, medications and orders.    Tests Ordered: No orders of the defined types were placed in this encounter.   Medication Changes: No orders of the defined types were placed in this encounter.    Disposition:  Follow up  Signed, [place], PA-C  09/18/2020 4:20 PM

## 2020-09-18 NOTE — Patient Instructions (Signed)

## 2020-09-18 NOTE — Telephone Encounter (Signed)
FYI patient covid positive on 09/17/20

## 2020-09-18 NOTE — Telephone Encounter (Signed)
Per inititial encounter, "Pt is calling and has been having diarrhea since sat and cold since Thursday and body aches no opening at [facility]: contacted pt to discuss symptoms; the pt says her body aches are her worst symptom; she also has   congestion, sore throat, diarrhea; she had 3 episodes of watery diarrhea in 24 hours she has not taken temp but she has been having "cold sweats"; her home COVID test was positive on 09/17/20; recommendations made per nurse triage protocol; the pt is seen at [facility]; there is no availability with  Providers; pt encouraged to consider [facility] for evaluation; will route to office for notification of encounter and pt follow up; she can be contacted at [phone]   Reason for Disposition . [1] HIGH RISK for severe COVID complications (e.g., weak immune system, age > 37 years, obesity with BMI > 25, pregnant, chronic lung disease or other chronic medical condition) AND [2] COVID symptoms (e.g., cough, fever)  (Exceptions: Already seen by PCP and no new or worsening symptoms.)  Answer Assessment - Initial Assessment Questions 1. COVID-19 DIAGNOSIS: "Who made your COVID-19 diagnosis?" "Was it confirmed by a positive lab test or self-test?" If not diagnosed by a doctor (or NP/PA), ask "Are there lots of cases (community spread) where you live?" Note: See public health department website, if unsure.     Home test 09/17/20 2. COVID-19 EXPOSURE: "Was there any known exposure to COVID before the symptoms began?" CDC Definition of close contact: within 6 feet (2 meters) for a total of 15 minutes or more over a 24-hour period.       3. ONSET: "When did the COVID-19 symptoms start?"     09/14/20 4. WORST SYMPTOM: "What is your worst symptom?" (e.g., cough, fever, shortness of breath, muscle aches)     Body aches 5. COUGH: "Do you have a cough?" If Yes, ask: "How bad is the cough?"       moderate 6. FEVER: "Do you have a fever?" If Yes, ask: "What is  your temperature, how was it measured, and when did it start?"     Usure, cold sweats 7. RESPIRATORY STATUS: "Describe your breathing?" (e.g., shortness of breath, wheezing, unable to speak)      no 8. BETTER-SAME-WORSE: "Are you getting better, staying the same or getting worse compared to yesterday?"  If getting worse, ask, "In what way?"     worse 9. HIGH RISK DISEASE: "Do you have any chronic medical problems?" (e.g., asthma, heart or lung disease, weak immune system, obesity, etc.)     DM 10. VACCINE: "Have you had the COVID-19 vaccine?" If Yes, ask: "Which one, how many shots, when did you get it?"      Pfizer x 2 11. BOOSTER: "Have you received your COVID-19 booster?" If Yes, ask: "Which one and when did you get it?"       none 12. PREGNANCY: "Is there any chance you are pregnant?" "When was your last menstrual period?"       13. OTHER SYMPTOMS: "Do you have any other symptoms?"  (e.g., chills, fatigue, headache, loss of smell or taste, muscle pain, sore throat)      congestion, sore throat, diarrhea; she had 3 episodes of watery diarrhea in 24 hours she has not taken temp but she has been having "cold sweats";  14. O2 SATURATION MONITOR:  "Do you use an oxygen saturation monitor (pulse oximeter) at home?" If Yes, ask "What is your reading (oxygen  level) today?" "What is your usual oxygen saturation reading?" (e.g., 95%)    n/a  Protocols used: CORONAVIRUS (COVID-19) DIAGNOSED OR SUSPECTED-A-AH

## 2020-09-19 ENCOUNTER — Ambulatory Visit: Payer: 59 | Attending: Family Medicine | Admitting: Family Medicine

## 2020-09-19 ENCOUNTER — Other Ambulatory Visit: Payer: Self-pay

## 2020-09-19 DIAGNOSIS — U071 COVID-19: Secondary | ICD-10-CM

## 2020-09-19 MED ORDER — BENZONATATE 100 MG PO CAPS
100.0000 mg | ORAL_CAPSULE | Freq: Three times a day (TID) | ORAL | 0 refills | Status: AC
  Filled 2020-09-19: qty 15, 5d supply, fill #0

## 2020-09-19 MED ORDER — MOLNUPIRAVIR EUA 200MG CAPSULE
4.0000 | ORAL_CAPSULE | Freq: Two times a day (BID) | ORAL | 0 refills | Status: AC
Start: 2020-09-19 — End: 2020-09-24
  Filled 2020-09-19: qty 40, 5d supply, fill #0

## 2020-09-19 MED ORDER — BENZONATATE 100 MG PO CAPS: 100.0000 mg | ORAL_CAPSULE | Freq: Three times a day (TID) | ORAL | 0 refills | Status: DC

## 2020-09-19 MED ORDER — MOLNUPIRAVIR EUA 200MG CAPSULE: 4.0000 | ORAL_CAPSULE | Freq: Two times a day (BID) | ORAL | 0 refills | Status: DC

## 2020-09-19 NOTE — Progress Notes (Signed)
Virtual Visit via Telephone Note  I connected with Stacy Bryan, on 09/19/2020 at 10:35 AM by telephone due to the COVID-19 pandemic and verified that I am speaking with the correct person using two identifiers.   Consent: I discussed the limitations, risks, security and privacy concerns of performing an evaluation and management service by telephone and the availability of in person appointments. I also discussed with the patient that there may be a patient responsible charge related to this service. The patient expressed understanding and agreed to proceed.   Location of Patient: Home  Location of Provider: Clinic   Persons participating in Telemedicine visit: Stacy Bryan Dr. Margarita Rana     History of Present Illness: Stacy Bryan is a 64 year old female with a history of , Type 2 DM (A1c 6.2), Hypertension, chronic low back pain, hypothyroidism (secondary to radioactive treatment for multinodular goiter), chronic venous insufficiency, vertigohere for an acute visit  She started feeling bad 5 days ago and tested positive from a COVID home test 3 days ago. Received both doses of Pfizer but no Booster. She has body aches, headaches. Diarrhea occurred 2 days ago which improved today. She has a 'weird smell' but no abnormal taste, loss of appetite. She has fatigue on exertion but no dyspnea. Cough is like a rattle in her throat but is not productive.  Yesterday she had a telehealth visit where she was prescribed Molnupiravir which she states she does not think [facility] has the prescription for.  Past Medical History:  Diagnosis Date  . Abnormal Pap smear   . Allergy   . Anemia   . GERD (gastroesophageal reflux disease)   . Hyperlipidemia   . Hypertension   . Thyroid disease   . Vertigo    No Known Allergies  Current Outpatient Medications on File Prior to Visit  Medication Sig Dispense Refill  . Vitamin D, Ergocalciferol, (DRISDOL) 1.25 MG (50000 UNIT)  CAPS capsule Take 1 capsule (50,000 Units total) by mouth every 7 (seven) days. 16 capsule 0  . aspirin EC 81 MG tablet Take 81 mg by mouth daily.    Marland Kitchen atorvastatin (LIPITOR) 20 MG tablet Take 1 tablet by mouth once daily 30 tablet 0  . benzonatate (TESSALON) 100 MG capsule Take 1 capsule (100 mg total) by mouth every 8 (eight) hours for 5 days. 15 capsule 0  . Blood Glucose Monitoring Suppl (ACCU-CHEK GUIDE ME) w/Device KIT Use daily 1 kit 0  . calcium carbonate (TITRALAC) 420 MG CHEW chewable tablet Chew by mouth.    . Cholecalciferol (VITAMIN D-3 PO) Take 400 Units by mouth daily.     . cyclobenzaprine (FLEXERIL) 10 MG tablet Take 1 tablet (10 mg total) by mouth 2 (two) times daily as needed for muscle spasms. 60 tablet 1  . diclofenac Sodium (VOLTAREN) 1 % GEL Apply 4 g topically 4 (four) times daily as needed. (Patient not taking: Reported on 09/30/2019) 500 g 6  . EUTHYROX 75 MCG tablet TAKE 1 TABLET BY MOUTH ONCE DAILY BEFORE BREAKFAST 90 tablet 1  . fish oil-omega-3 fatty acids 1000 MG capsule Take 2 g by mouth daily.    . fluticasone (FLONASE) 50 MCG/ACT nasal spray Place 2 sprays into both nostrils daily. 16 g 6  . glucose blood (ACCU-CHEK GUIDE) test strip Use as instructed daily 100 each 3  . guaiFENesin (MUCINEX) 600 MG 12 hr tablet Take 1 tablet (600 mg total) by mouth 2 (two) times daily. 6 tablet 0  .  hydrochlorothiazide (MICROZIDE) 12.5 MG capsule Take 1 capsule (12.5 mg total) by mouth daily. 90 capsule 1  . magnesium oxide (MAG-OX) 400 MG tablet Take 400 mg by mouth daily.     . meclizine (ANTIVERT) 25 MG tablet Take 1 tablet (25 mg total) by mouth 3 (three) times daily as needed for dizziness. 60 tablet 1  . meloxicam (MOBIC) 15 MG tablet Take 1 tablet by mouth once daily 30 tablet 0  . metFORMIN (GLUCOPHAGE) 500 MG tablet Take 2 tablets (1,000 mg total) by mouth 2 (two) times daily with a meal. 360 tablet 1  . metroNIDAZOLE (FLAGYL) 500 MG tablet Take 1 tablet (500 mg total) by  mouth 2 (two) times daily. 14 tablet 0  . metroNIDAZOLE (METROGEL VAGINAL) 0.75 % vaginal gel Place 1 Applicatorful vaginally at bedtime. 70 g 0  . molnupiravir EUA 200 mg CAPS Take 4 capsules (800 mg total) by mouth 2 (two) times daily for 5 days. 40 capsule 0  . OVER THE COUNTER MEDICATION Chlor Tab, two tablets daily for allergies.     Current Facility-Administered Medications on File Prior to Visit  Medication Dose Route Frequency Provider Last Rate Last Admin  . 0.9 %  sodium chloride infusion  500 mL Intravenous Once Armbruster, Carlota Raspberry, MD        ROS: Awake, alert, and 2x3 Not in acute distress  Observations/Objective: Awake, alert, oriented x3 Not in acute distress  Assessment and Plan: 1. COVID-19 virus infection Prescription for Molnupiravir was previously done by clinician during video visit from yesterday and patient endorses receiving information about medication from clinician. Unfortunately she was unable to obtain this from [facility] and I have sent it to the pharmacy in house. - molnupiravir EUA 200 mg CAPS; Take 4 capsules (800 mg total) by mouth 2 (two) times daily for 5 days.  Dispense: 40 capsule; Refill: 0 - benzonatate (TESSALON) 100 MG capsule; Take 1 capsule (100 mg total) by mouth every 8 (eight) hours for 5 days.  Dispense: 15 capsule; Refill: 0   Follow Up Instructions: Advised that if symptoms progress or dyspnea worsen she needs to go to the ER.  Keep previously scheduled appointment   I discussed the assessment and treatment plan with the patient. The patient was provided an opportunity to ask questions and all were answered. The patient agreed with the plan and demonstrated an understanding of the instructions.   The patient was advised to call back or seek an in-person evaluation if the symptoms worsen or if the condition fails to improve as anticipated.     I provided 11 minutes total of non-face-to-face time during this encounter.   Charlott Rakes, MD, FAAFP. [facility] [place] [facility], [place]   09/19/2020, 10:35 AM

## 2020-09-22 ENCOUNTER — Encounter: Payer: Self-pay | Admitting: Family Medicine

## 2020-10-10 ENCOUNTER — Ambulatory Visit: Payer: Self-pay

## 2020-10-10 ENCOUNTER — Encounter (HOSPITAL_COMMUNITY): Payer: Self-pay

## 2020-10-10 ENCOUNTER — Emergency Department (HOSPITAL_COMMUNITY)
Admission: EM | Admit: 2020-10-10 | Discharge: 2020-10-10 | Disposition: A | Discharge status: Home / Self Care | Payer: 59 | Attending: Emergency Medicine | Admitting: Emergency Medicine

## 2020-10-10 ENCOUNTER — Other Ambulatory Visit: Payer: Self-pay

## 2020-10-10 ENCOUNTER — Emergency Department (HOSPITAL_COMMUNITY): Payer: 59

## 2020-10-10 DIAGNOSIS — I10 Essential (primary) hypertension: Secondary | ICD-10-CM | POA: Diagnosis not present

## 2020-10-10 DIAGNOSIS — Z87891 Personal history of nicotine dependence: Secondary | ICD-10-CM | POA: Insufficient documentation

## 2020-10-10 DIAGNOSIS — U071 COVID-19: Secondary | ICD-10-CM | POA: Insufficient documentation

## 2020-10-10 DIAGNOSIS — Z79899 Other long term (current) drug therapy: Secondary | ICD-10-CM | POA: Insufficient documentation

## 2020-10-10 DIAGNOSIS — E039 Hypothyroidism, unspecified: Secondary | ICD-10-CM | POA: Insufficient documentation

## 2020-10-10 DIAGNOSIS — R0789 Other chest pain: Secondary | ICD-10-CM | POA: Diagnosis present

## 2020-10-10 DIAGNOSIS — G44219 Episodic tension-type headache, not intractable: Secondary | ICD-10-CM | POA: Diagnosis not present

## 2020-10-10 DIAGNOSIS — R202 Paresthesia of skin: Secondary | ICD-10-CM | POA: Insufficient documentation

## 2020-10-10 DIAGNOSIS — R600 Localized edema: Secondary | ICD-10-CM | POA: Diagnosis not present

## 2020-10-10 DIAGNOSIS — Z7982 Long term (current) use of aspirin: Secondary | ICD-10-CM | POA: Insufficient documentation

## 2020-10-10 LAB — BASIC METABOLIC PANEL
Anion gap: 7 (ref 5–15)
BUN: 21 mg/dL (ref 8–23)
CO2: 28 mmol/L (ref 22–32)
Calcium: 9.8 mg/dL (ref 8.9–10.3)
Chloride: 105 mmol/L (ref 98–111)
Creatinine, Ser: 0.87 mg/dL (ref 0.44–1.00)
GFR, Estimated: >60 mL/min (ref >60)
Glucose, Bld: 83 mg/dL (ref 70–99)
Potassium: 3.8 mmol/L (ref 3.5–5.1)
Sodium: 140 mmol/L (ref 135–145)

## 2020-10-10 LAB — CBC
HCT: 39.6 % (ref 36.0–46.0)
Hemoglobin: 13 g/dL (ref 12.0–15.0)
MCH: 27.6 pg (ref 26.0–34.0)
MCHC: 32.8 g/dL (ref 30.0–36.0)
MCV: 84.1 fL (ref 80.0–100.0)
Platelets: 242 10*3/uL (ref 150–400)
RBC: 4.71 MIL/uL (ref 3.87–5.11)
RDW: 13.9 % (ref 11.5–15.5)
WBC: 3.8 10*3/uL — ABNORMAL LOW (ref 4.0–10.5)
nRBC: 0 % (ref 0.0–0.2)

## 2020-10-10 LAB — RESP PANEL BY RT-PCR (FLU A&B, COVID) ARPGX2
Influenza A by PCR: NEGATIVE
Influenza B by PCR: NEGATIVE
SARS Coronavirus 2 by RT PCR: POSITIVE — AB

## 2020-10-10 LAB — TROPONIN I (HIGH SENSITIVITY)
Troponin I (High Sensitivity): 11 ng/L (ref <18)
Troponin I (High Sensitivity): 12 ng/L (ref <18)

## 2020-10-10 NOTE — Discharge Instructions (Addendum)
You were seen in the ER today for your chest pain and headache.  Your physical exam for vital signs, blood work, chest x-ray, and EKG were very reassuring.  There is no sign of stress on your heart or infection or fluid in your lungs.  Additionally given you are tender to palpation over the area of your chest pain on the right, this is likely muscular pain.  You may utilize Tylenol as needed as well as topical pain relief such as Biofreeze or icy hot. Regarding your intermittent sore throat recommend he follow-up closely with your primary care doctor.  You have been tested for COVID-19 and influenza ; these test results are pending.  May follow them in the MyChart app.  Return to the emergency department if you develop any sudden worsening of your headache, blurry vision, double vision, sudden onset chest pain, shortness of breath, nausea, vomiting, or any other new severe symptoms.

## 2020-10-10 NOTE — ED Provider Notes (Addendum)
[facility] DEPT Provider Note   CSN: [id number] Arrival date & time: 10/10/20  1218     History Chief Complaint  Patient presents with   Chest Pain    Stacy Bryan is a 65 y.o. female who presents with concern for intermittent right-sided chest pain and headache x1 month.  Has not sought medical care prior to today.  She states that this morning when she had discharge about the pain in her right chest was more severe than it has been prior, squeezing pain without associated shortness of breath, and she presented to emergency for further evaluation.  He does feel sometimes chest pain "takes my breath away".  She endorses frontal headache intermittently over the last week not relieved with Tylenol, not associated with blurry vision or double vision at this time.  Does endorse intermittent tingling around her mouth though she is not experiencing the symptoms at this time.  She has been vaccinated x2 against COVID-19 and denies any fevers, chills, cough, shortness of breath though she does endorse mild sore throat x2 weeks.  I personally reviewed the patient's medical records.  She has history of hypertension on HCTZ, hyperlipidemia,, is on metformin but she denies diagnosis of diabetes.  No history of cardiac complications in the past.  Chronic bilateral lower extremity edema, not worsened recently.  HPI     Past Medical History:  Diagnosis Date   Abnormal Pap smear    Allergy    Anemia    GERD (gastroesophageal reflux disease)    Hyperlipidemia    Hypertension    Thyroid disease    Vertigo     Patient Active Problem List   Diagnosis Date Noted   Hypothyroidism, postablative 09/29/2017   Hyperthyroidism 02/26/2017   Vertigo 02/26/2017   Chronic venous insufficiency 01/10/2017   Varicose veins of both lower extremities with complications 82/80/0349   Preventative health care 07/04/2014   Chronic maxillary sinusitis 07/04/2014   Midline  low back pain without sciatica 10/08/2013   Encounter to establish care 10/08/2013   Other specified hypothyroidism 10/08/2013   Breast cancer screening 10/08/2013   Colon cancer screening 10/08/2013   Subclinical hypothyroidism 10/02/2011   HTN (hypertension), benign 06/01/2011   Dyslipidemia 06/01/2011   GERD (gastroesophageal reflux disease) 06/01/2011   History of seasonal allergies 06/01/2011    Past Surgical History:  Procedure Laterality Date   ABDOMINAL HYSTERECTOMY  1997   total   FOOT SURGERY     both feet  20 yrs ago   OVARY SURGERY  1980's   removal of R ovary (cyst)     OB History   No obstetric history on file.     Family History  Problem Relation Age of Onset   Hypertension Mother    Heart disease Father    Asthma Father    Hyperlipidemia Father    Asthma Sister    Breast cancer Sister    Stomach cancer Sister    Thyroid disease Sister        Surgery   Heart disease Sister    Diabetes Sister    Gout Sister    Diabetes Sister    Ulcers Sister    Cancer Maternal Grandmother    Colon cancer Neg Hx    Esophageal cancer Neg Hx    Liver cancer Neg Hx    Pancreatic cancer Neg Hx    Rectal cancer Neg Hx     Social History   Tobacco Use   Smoking status:  Former    Pack years: 0.00    Types: Cigarettes    Quit date: 06/02/2006    Years since quitting: 14.3   Smokeless tobacco: Never  Vaping Use   Vaping Use: Never used  Substance Use Topics   Alcohol use: Yes    Comment: occassionally   Drug use: No    Home Medications Prior to Admission medications   Medication Sig Start Date End Date Taking? Authorizing Provider  Vitamin D, Ergocalciferol, (DRISDOL) 1.25 MG (50000 UNIT) CAPS capsule Take 1 capsule (50,000 Units total) by mouth every 7 (seven) days. 04/19/20   Argentina Donovan, PA-C  aspirin EC 81 MG tablet Take 81 mg by mouth daily.    [provider]  atorvastatin (LIPITOR) 20 MG tablet Take 1 tablet by mouth once daily 09/03/20    Charlott Rakes, MD  Blood Glucose Monitoring Suppl (ACCU-CHEK GUIDE ME) w/Device KIT Use daily 09/30/19   Charlott Rakes, MD  calcium carbonate (TITRALAC) 420 MG CHEW chewable tablet Chew by mouth.    [provider]  Cholecalciferol (VITAMIN D-3 PO) Take 400 Units by mouth daily.     [provider]  cyclobenzaprine (FLEXERIL) 10 MG tablet Take 1 tablet (10 mg total) by mouth 2 (two) times daily as needed for muscle spasms. 09/30/19   Charlott Rakes, MD  diclofenac Sodium (VOLTAREN) 1 % GEL Apply 4 g topically 4 (four) times daily as needed. Patient not taking: Reported on 09/30/2019 06/08/19   Hilts, Legrand Como, MD  EUTHYROX 75 MCG tablet TAKE 1 TABLET BY MOUTH ONCE DAILY BEFORE BREAKFAST 07/31/20   Elayne Snare, MD  fish oil-omega-3 fatty acids 1000 MG capsule Take 2 g by mouth daily.    [provider]  fluticasone (FLONASE) 50 MCG/ACT nasal spray Place 2 sprays into both nostrils daily. 04/18/20   Argentina Donovan, PA-C  glucose blood (ACCU-CHEK GUIDE) test strip Use as instructed daily 09/30/19   Charlott Rakes, MD  guaiFENesin (MUCINEX) 600 MG 12 hr tablet Take 1 tablet (600 mg total) by mouth 2 (two) times daily. 09/18/20   Couture, Cortni S, PA-C  hydrochlorothiazide (MICROZIDE) 12.5 MG capsule Take 1 capsule (12.5 mg total) by mouth daily. 09/30/19   Charlott Rakes, MD  magnesium oxide (MAG-OX) 400 MG tablet Take 400 mg by mouth daily.     [provider]  meclizine (ANTIVERT) 25 MG tablet Take 1 tablet (25 mg total) by mouth 3 (three) times daily as needed for dizziness. 09/30/19   Charlott Rakes, MD  meloxicam (MOBIC) 15 MG tablet Take 1 tablet by mouth once daily 08/01/20   Charlott Rakes, MD  metFORMIN (GLUCOPHAGE) 500 MG tablet Take 2 tablets (1,000 mg total) by mouth 2 (two) times daily with a meal. 04/19/20   McClung, Dionne Bucy, PA-C  metroNIDAZOLE (FLAGYL) 500 MG tablet Take 1 tablet (500 mg total) by mouth 2 (two) times daily. 01/04/20   Charlott Rakes, MD   metroNIDAZOLE (METROGEL VAGINAL) 0.75 % vaginal gel Place 1 Applicatorful vaginally at bedtime. 01/04/20   Charlott Rakes, MD  OVER THE COUNTER MEDICATION Chlor Tab, two tablets daily for allergies.    [provider]    Allergies    Patient has no known allergies.  Review of Systems   Review of Systems  Constitutional: Negative.   HENT: Negative.    Eyes:  Negative for photophobia and visual disturbance.  Respiratory: Negative.  Negative for cough, chest tightness, shortness of breath, wheezing and stridor.   Cardiovascular:  Positive for chest pain, palpitations and leg swelling.  Gastrointestinal: Negative.   Genitourinary: Negative.   Musculoskeletal: Negative.   Skin: Negative.   Neurological:  Positive for headaches. Negative for dizziness, tremors, weakness and light-headedness.   Physical Exam Updated Vital Signs BP (!) 153/85   Pulse 66   Temp (!) 97.4 F (36.3 C)   Resp 16   Ht _0  (1.778 m)   Wt 102.1 kg   SpO2 98%   BMI 32.28 kg/m   Physical Exam Vitals and nursing note reviewed.  Constitutional:      Appearance: She is not toxic-appearing.  HENT:     Head: Normocephalic and atraumatic.     Nose: Nose normal.     Mouth/Throat:     Mouth: Mucous membranes are moist.     Pharynx: Oropharynx is clear. Uvula midline. No oropharyngeal exudate or posterior oropharyngeal erythema.  Eyes:     General: Lids are normal. Vision grossly intact.        Right eye: No discharge.        Left eye: No discharge.     Extraocular Movements: Extraocular movements intact.     Conjunctiva/sclera: Conjunctivae normal.     Pupils: Pupils are equal, round, and reactive to light.  Neck:     Trachea: Trachea and phonation normal.  Cardiovascular:     Rate and Rhythm: Normal rate and regular rhythm.     Pulses: Normal pulses.     Heart sounds: Normal heart sounds. No murmur heard. Pulmonary:     Effort: Pulmonary effort is normal. No tachypnea, bradypnea,  accessory muscle usage, prolonged expiration or respiratory distress.     Breath sounds: Normal breath sounds. No wheezing or rales.  Chest:     Chest wall: Tenderness present. No mass, lacerations, deformity, swelling, crepitus or edema.    Abdominal:     General: Bowel sounds are normal. There is no distension.     Palpations: Abdomen is soft.     Tenderness: There is no abdominal tenderness. There is no guarding or rebound.  Musculoskeletal:        General: No deformity.     Cervical back: Normal range of motion and neck supple. No edema, rigidity or crepitus. No pain with movement or spinous process tenderness.     Right lower leg: No tenderness. 1+ Edema present.     Left lower leg: No tenderness. 1+ Edema present.     Comments: FROM of shoulders bilaterally  Lymphadenopathy:     Cervical: No cervical adenopathy.  Skin:    General: Skin is warm and dry.     Capillary Refill: Capillary refill takes less than 2 seconds.  Neurological:     General: No focal deficit present.     Mental Status: She is alert and oriented to person, place, and time. Mental status is at baseline.  Psychiatric:        Mood and Affect: Mood normal.    ED Results / Procedures / Treatments   Labs (all labs ordered are listed, but only abnormal results are displayed) Labs Reviewed  RESP PANEL BY RT-PCR (FLU A&B, COVID) ARPGX2 - Abnormal; Notable for the following components:      Result Value   SARS Coronavirus 2 by RT PCR POSITIVE (*)    All other components within normal limits  CBC - Abnormal; Notable for the following components:   WBC 3.8 (*)    All other components within normal limits  BASIC METABOLIC PANEL  TROPONIN I (HIGH SENSITIVITY)  TROPONIN I (HIGH SENSITIVITY)    EKG EKG: sinus bradycardia, no STEMI.   Radiology DG Chest 2 View  Result Date: 10/10/2020 CLINICAL DATA:  Chest pain EXAM: CHEST - 2 VIEW COMPARISON:  None. FINDINGS: Lungs are clear. Heart is upper normal in size  with pulmonary vascularity normal. No adenopathy. No pneumothorax. No bone lesions. IMPRESSION: Lungs clear.  Heart upper normal in size. Electronically Signed   By: Lowella Grip III M.D.   On: 10/10/2020 14:25    Procedures Procedures   Medications Ordered in ED Medications - No data to display  ED Course  I have reviewed the triage vital signs and the nursing notes.  Pertinent labs & imaging results that were available during my care of the patient were reviewed by me and considered in my medical decision making (see chart for details).    MDM Rules/Calculators/A&P                         65 year old female presents with concern for right-sided chest pain.  X1 month.  Differential diagnosis includes limited to ACS, arrhythmia, pleural effusion, pneumonia, GERD, musculoskeletal injury.  Hypertensive on intake, borderline bradycardic with heart rate in the 50s, per chart review normal for this patient.  Cardiopulmonary exam is normal, abdominal exam is benign.  Musculoskeletal exam revealed mild tenderness palpation of the right chest wall without crepitus, bruising, erythema, induration, or skin changes.  1+ bilateral lower extremity edema without pitting.  Full range of motion's of the shoulder.  CBC with mild leukopenia of 3.8,  otherwise unremarkable.  BMP unremarkable, troponin negative, 11.  Delta troponin negative, 2.  Chest x-ray without acute cardiopulmonary disease.  EKG reassuring.  Will test patient for COVID-19 given recent onset of sore throat as well.  Patient is vaccinated.  Recommend close outpatient follow-up.  While the exact etiology of this patient's chest pain remains unclear, suspect muscular injury given tenderness palpation of the right chest wall and reassuring cardiac work-up.  No further work-up warranted in the ED at this time.  May follow-up on her COVID-19 test results in the MyChart app.  [place] voiced understanding of her medical evaluation treatment.   Each of her questions answered to her expressed satisfaction.  Return precautions given.  Patient is stable and appropriate for discharge at this time.  This chart was dictated using voice recognition software, Dragon. Despite the best efforts of this provider to proofread and correct errors, errors may still occur which can change documentation meaning.  Final Clinical Impression(s) / ED Diagnoses Final diagnoses:  Chest wall pain  Episodic tension-type headache, not intractable    Rx / DC Orders ED Discharge Orders     None        Emeline Darling, PA-C 10/11/20 0041    Ediel Unangst, Gypsy Balsam, PA-C 10/11/20 0041    Lennice Sites, DO 10/11/20 2316

## 2020-10-10 NOTE — ED Provider Notes (Signed)
Emergency Medicine Provider Triage Evaluation Note  Stacy Bryan , a 65 y.o. female  was evaluated in triage.  Pt complains of right-sided chest pain and headache.  Patient states that she has been having episodes of right-sided chest pain with associated headache for about a month.  However today she states that the pain felt more squeezing on the right side of her chest.  She also complains of frontal headache with occasional blurry vision and tingling around her mouth.  She feels like the chest pain sometimes will "take her breath away".  She denies any fevers, chills, cough, shortness of breath.  No dizziness or syncope.  Review of Systems  Positive: As above Negative: As above  Physical Exam  BP (!) 144/89 (BP Location: Right Arm)   Pulse (!) 56   Temp (!) 97.5 F (36.4 C)   Resp 18   Ht 5\' 10"  (1.778 m)   Wt 102.1 kg   SpO2 97%   BMI 32.28 kg/m  Gen:   Awake, no distress   Resp:  Normal effort  MSK:   Moves extremities without difficulty  Other:    Medical Decision Making  Medically screening exam initiated at 12:40 PM.  Appropriate orders placed.  Stacy Bryan was informed that the remainder of the evaluation will be completed by another provider, this initial triage assessment does not replace that evaluation, and the importance of remaining in the ED until their evaluation is complete.     Garald Balding, PA-C 10/10/20 1242    Lucrezia Starch, MD 10/11/20 [id number]

## 2020-10-10 NOTE — Telephone Encounter (Signed)
Pt. Reports she has been having chest pain that comes and goes x 1 month. Hurts on right side. Sometimes has jaw pain. Chest feels "sore now, not really hurting." This morning had dizziness as well as jaw pain.Pain was 5-6/10. Instructed to go to ED for evaluation. Will have someone drive her.

## 2020-10-10 NOTE — Telephone Encounter (Signed)
Noted  

## 2020-10-10 NOTE — Telephone Encounter (Signed)
Reason for Disposition . Pain also in shoulder(s) or arm(s) or jaw (Exception: pain is clearly made worse by movement)  Answer Assessment - Initial Assessment Questions 1. LOCATION: "Where does it hurt?"       Right 2. RADIATION: "Does the pain go anywhere else?" (e.g., into neck, jaw, arms, back)     Jaw 3. ONSET: "When did the chest pain begin?" (Minutes, hours or days)      1 month 4. PATTERN "Does the pain come and go, or has it been constant since it started?"  "Does it get worse with exertion?"      Comes and goes 5. DURATION: "How long does it last" (e.g., seconds, minutes, hours)     Minutes 6. SEVERITY: "How bad is the pain?"  (e.g., Scale 1-10; mild, moderate, or severe)    - MILD (1-3): doesn't interfere with normal activities     - MODERATE (4-7): interferes with normal activities or awakens from sleep    - SEVERE (8-10): excruciating pain, unable to do any normal activities       This morning 5-6   Gone now 7. CARDIAC RISK FACTORS: "Do you have any history of heart problems or risk factors for heart disease?" (e.g., angina, prior heart attack; diabetes, high blood pressure, high cholesterol, smoker, or strong family history of heart disease)     Diabetes 8. PULMONARY RISK FACTORS: "Do you have any history of lung disease?"  (e.g., blood clots in lung, asthma, emphysema, birth control pills)     No 9. CAUSE: "What do you think is causing the chest pain?"     Unsure 10. OTHER SYMPTOMS: "Do you have any other symptoms?" (e.g., dizziness, nausea, vomiting, sweating, fever, difficulty breathing, cough)       Lightheaded 11. PREGNANCY: "Is there any chance you are pregnant?" "When was your last menstrual period?"       No  Protocols used: Chest Pain-A-AH

## 2020-10-10 NOTE — ED Triage Notes (Signed)
Pt arrived via walk in, c/o chest pain on and off for months, worsening today, relieved by lying flat and headache x3 days. Denies any other sx or sick contacts.

## 2020-10-17 ENCOUNTER — Other Ambulatory Visit: Payer: Self-pay | Admitting: Family Medicine

## 2020-10-17 DIAGNOSIS — E1169 Type 2 diabetes mellitus with other specified complication: Secondary | ICD-10-CM

## 2020-10-18 NOTE — Telephone Encounter (Signed)
   Notes to clinic:  Requested script has expired  Review for new script    Requested Prescriptions  Pending Prescriptions Disp Refills   atorvastatin (LIPITOR) 20 MG tablet [Pharmacy Med Name: Atorvastatin Calcium 20 MG Oral Tablet] 30 tablet 0    Sig: Take 1 tablet by mouth once daily      Cardiovascular:  Antilipid - Statins Failed - 10/17/2020  6:05 PM      Failed - Total Cholesterol in normal range and within 360 days    Cholesterol, Total  Date Value Ref Range Status  09/30/2019 224 (H) 100 - 199 mg/dL Final          Failed - LDL in normal range and within 360 days    LDL Chol Calc (NIH)  Date Value Ref Range Status  09/30/2019 137 (H) 0 - 99 mg/dL Final          Failed - HDL in normal range and within 360 days    HDL  Date Value Ref Range Status  09/30/2019 50 >39 mg/dL Final          Failed - Triglycerides in normal range and within 360 days    Triglycerides  Date Value Ref Range Status  09/30/2019 207 (H) 0 - 149 mg/dL Final          Passed - Patient is not pregnant      Passed - Valid encounter within last 12 months    Recent Outpatient Visits           4 weeks ago COVID-19 virus infection   [place], [place], MD   6 months ago Type 2 diabetes mellitus with other neurologic complication, without long-term current use of insulin [place])   [place], [place], [place]   9 months ago Type 2 diabetes mellitus with other neurologic complication, without long-term current use of insulin ([place])   [place], Amery, MD   1 year ago Type 2 diabetes mellitus with other specified complication, without long-term current use of insulin ([place])   [place], [place], MD   1 year ago Acute intractable headache, unspecified headache type   [place], Dionne Bucy, [place]       Future  Appointments             In 5 days [facility]

## 2020-10-23 ENCOUNTER — Ambulatory Visit (INDEPENDENT_AMBULATORY_CARE_PROVIDER_SITE_OTHER): Payer: 59 | Admitting: Nurse Practitioner

## 2020-10-23 VITALS — BP 156/79 | HR 72 | Temp 97.9°F | Resp 18

## 2020-10-23 DIAGNOSIS — R079 Chest pain, unspecified: Secondary | ICD-10-CM

## 2020-10-23 DIAGNOSIS — Z8616 Personal history of COVID-19: Secondary | ICD-10-CM | POA: Diagnosis not present

## 2020-10-23 DIAGNOSIS — R002 Palpitations: Secondary | ICD-10-CM | POA: Diagnosis not present

## 2020-10-23 NOTE — Progress Notes (Signed)
'@Patient'  ID: Stacy Bryan, female    DOB: 08-06-1955, 65 y.o.   MRN: [id number]  Chief Complaint  Patient presents with   Hospitalization Follow-up    Referring provider: Charlott Rakes, MD    HPI  Patient presents today for a transitional care visit/hospital follow-up.  Patient was seen in the [facility] long emergency department on 10/10/2020 with chest pain.  Overall testing was negative and patient was discharged home.  Patient did test positive for COVID.  Patient states that she continues to have ongoing palpitations and intermittent chest pain.  She does have shortness of breath and fatigue at times.  Patient does have a significant family history of heart disease.  Denies f/c/s, n/v/d, hemoptysis, PND, chest pain or edema.       No Known Allergies  Immunization History  Administered Date(s) Administered   Influenza,inj,Quad PF,6+ Mos 12/09/2017, 01/11/2019, 01/03/2020   PFIZER(Purple Top)SARS-COV-2 Vaccination 05/08/2019, 05/29/2019   Pneumococcal Conjugate-13 01/11/2019   Tdap 09/21/2012    Past Medical History:  Diagnosis Date   Abnormal Pap smear    Allergy    Anemia    GERD (gastroesophageal reflux disease)    Hyperlipidemia    Hypertension    Thyroid disease    Vertigo     Tobacco History: Social History   Tobacco Use  Smoking Status Former   Pack years: 0.00   Types: Cigarettes   Quit date: 06/02/2006   Years since quitting: 14.4  Smokeless Tobacco Never   Counseling given: Yes   Outpatient Encounter Medications as of 10/23/2020  Medication Sig   Vitamin D, Ergocalciferol, (DRISDOL) 1.25 MG (50000 UNIT) CAPS capsule Take 1 capsule (50,000 Units total) by mouth every 7 (seven) days.   aspirin EC 81 MG tablet Take 81 mg by mouth daily.   atorvastatin (LIPITOR) 20 MG tablet Take 1 tablet by mouth once daily   Blood Glucose Monitoring Suppl (ACCU-CHEK GUIDE ME) w/Device KIT Use daily   calcium carbonate (TITRALAC) 420 MG CHEW chewable tablet Chew  by mouth.   Cholecalciferol (VITAMIN D-3 PO) Take 400 Units by mouth daily.    cyclobenzaprine (FLEXERIL) 10 MG tablet Take 1 tablet (10 mg total) by mouth 2 (two) times daily as needed for muscle spasms.   diclofenac Sodium (VOLTAREN) 1 % GEL Apply 4 g topically 4 (four) times daily as needed. (Patient not taking: Reported on 09/30/2019)   EUTHYROX 75 MCG tablet TAKE 1 TABLET BY MOUTH ONCE DAILY BEFORE BREAKFAST   fish oil-omega-3 fatty acids 1000 MG capsule Take 2 g by mouth daily.   fluticasone (FLONASE) 50 MCG/ACT nasal spray Place 2 sprays into both nostrils daily.   glucose blood (ACCU-CHEK GUIDE) test strip Use as instructed daily   guaiFENesin (MUCINEX) 600 MG 12 hr tablet Take 1 tablet (600 mg total) by mouth 2 (two) times daily.   hydrochlorothiazide (MICROZIDE) 12.5 MG capsule Take 1 capsule (12.5 mg total) by mouth daily.   magnesium oxide (MAG-OX) 400 MG tablet Take 400 mg by mouth daily.    meclizine (ANTIVERT) 25 MG tablet Take 1 tablet (25 mg total) by mouth 3 (three) times daily as needed for dizziness.   meloxicam (MOBIC) 15 MG tablet Take 1 tablet by mouth once daily   metFORMIN (GLUCOPHAGE) 500 MG tablet Take 2 tablets (1,000 mg total) by mouth 2 (two) times daily with a meal.   metroNIDAZOLE (FLAGYL) 500 MG tablet Take 1 tablet (500 mg total) by mouth 2 (two) times daily.   metroNIDAZOLE (METROGEL  VAGINAL) 0.75 % vaginal gel Place 1 Applicatorful vaginally at bedtime.   OVER THE COUNTER MEDICATION Chlor Tab, two tablets daily for allergies.   Facility-Administered Encounter Medications as of 10/23/2020  Medication   0.9 %  sodium chloride infusion     Review of Systems  Review of Systems  Constitutional: Negative.   HENT: Negative.    Respiratory:  Negative for cough and shortness of breath.   Cardiovascular:  Positive for chest pain and palpitations. Negative for leg swelling.  Gastrointestinal: Negative.   Allergic/Immunologic: Negative.   Neurological: Negative.    Psychiatric/Behavioral: Negative.        Physical Exam  BP (!) 156/79   Pulse 72   Temp 97.9 F (36.6 C)   Resp 18   SpO2 99%   Wt Readings from Last 5 Encounters:  10/10/20 225 lb (102.1 kg)  04/18/20 212 lb 9.6 oz (96.4 kg)  01/03/20 216 lb (98 kg)  09/30/19 223 lb (101.2 kg)  06/17/19 220 lb (99.8 kg)     Physical Exam Vitals and nursing note reviewed.  Constitutional:      General: She is not in acute distress.    Appearance: She is well-developed.  Cardiovascular:     Rate and Rhythm: Normal rate and regular rhythm.  Pulmonary:     Effort: Pulmonary effort is normal.     Breath sounds: Normal breath sounds.  Musculoskeletal:     Right lower leg: No edema.     Left lower leg: No edema.  Neurological:     Mental Status: She is alert and oriented to person, place, and time.     Lab Results:  CBC    Component Value Date/Time   WBC 3.8 (L) 10/10/2020 1242   RBC 4.71 10/10/2020 1242   HGB 13.0 10/10/2020 1242   HGB 12.6 02/26/2017 1113   HCT 39.6 10/10/2020 1242   HCT 38.0 02/26/2017 1113   PLT 242 10/10/2020 1242   PLT 247 02/26/2017 1113   MCV 84.1 10/10/2020 1242   MCV 83 02/26/2017 1113   MCH 27.6 10/10/2020 1242   MCHC 32.8 10/10/2020 1242   RDW 13.9 10/10/2020 1242   RDW 14.4 02/26/2017 1113   LYMPHSABS 1.5 03/05/2018 1424   LYMPHSABS 1.8 02/26/2017 1113   MONOABS 0.4 03/05/2018 1424   EOSABS 0.2 03/05/2018 1424   EOSABS 0.2 02/26/2017 1113   BASOSABS 0.0 03/05/2018 1424   BASOSABS 0.0 02/26/2017 1113    BMET    Component Value Date/Time   NA 140 10/10/2020 1242   NA 140 04/18/2020 1650   K 3.8 10/10/2020 1242   CL 105 10/10/2020 1242   CO2 28 10/10/2020 1242   GLUCOSE 83 10/10/2020 1242   BUN 21 10/10/2020 1242   BUN 16 04/18/2020 1650   CREATININE 0.87 10/10/2020 1242   CREATININE 0.87 10/08/2013 1130   CALCIUM 9.8 10/10/2020 1242   GFRNONAA >60 10/10/2020 1242   GFRNONAA 73 10/08/2013 1130   GFRAA 76 04/18/2020 1650    GFRAA 84 10/08/2013 1130    BNP No results found for: BNP  ProBNP No results found for: PROBNP  Imaging: DG Chest 2 View  Result Date: 10/10/2020 CLINICAL DATA:  Chest pain EXAM: CHEST - 2 VIEW COMPARISON:  None. FINDINGS: Lungs are clear. Heart is upper normal in size with pulmonary vascularity normal. No adenopathy. No pneumothorax. No bone lesions. IMPRESSION: Lungs clear.  Heart upper normal in size. Electronically Signed   By: Lowella Grip III M.D.  On: 10/10/2020 14:25     Assessment & Plan:   History of COVID-19 Chest pain Palpitations Fatigue:   Stay well hydrated  Stay active  Deep breathing exercises  May take tylenol for fever or pain  Will place referral to cardiology for further evaluation   Follow up:  Follow up if needed     Fenton Foy, NP 10/24/2020

## 2020-10-23 NOTE — Patient Instructions (Addendum)
Covid 19 Chest pain Palpitations Fatigue:   Stay well hydrated  Stay active  Deep breathing exercises  May take tylenol for fever or pain  Will place referral to cardiology for further evaluation   Follow up:  Follow up if needed

## 2020-10-23 NOTE — Progress Notes (Signed)
'@Patient'  ID: Stacy Bryan, female    DOB: 1956-03-08, 65 y.o.   MRN: [id number]  No chief complaint on file.   Referring provider: Charlott Rakes, MD  HPI: HPI   Recent [place] Pulmonary Encounters:     Tests:  Imaging:   Cardiac:   Labs:   Micro:   Chart Review:       No Known Allergies  Immunization History  Administered Date(s) Administered   Influenza,inj,Quad PF,6+ Mos 12/09/2017, 01/11/2019, 01/03/2020   PFIZER(Purple Top)SARS-COV-2 Vaccination 05/08/2019, 05/29/2019   Pneumococcal Conjugate-13 01/11/2019   Tdap 09/21/2012    Past Medical History:  Diagnosis Date   Abnormal Pap smear    Allergy    Anemia    GERD (gastroesophageal reflux disease)    Hyperlipidemia    Hypertension    Thyroid disease    Vertigo     Tobacco History: Social History   Tobacco Use  Smoking Status Former   Pack years: 0.00   Types: Cigarettes   Quit date: 06/02/2006   Years since quitting: 14.4  Smokeless Tobacco Never   Counseling given: Not Answered   Outpatient Encounter Medications as of 10/23/2020  Medication Sig   Vitamin D, Ergocalciferol, (DRISDOL) 1.25 MG (50000 UNIT) CAPS capsule Take 1 capsule (50,000 Units total) by mouth every 7 (seven) days.   aspirin EC 81 MG tablet Take 81 mg by mouth daily.   atorvastatin (LIPITOR) 20 MG tablet Take 1 tablet by mouth once daily   Blood Glucose Monitoring Suppl (ACCU-CHEK GUIDE ME) w/Device KIT Use daily   calcium carbonate (TITRALAC) 420 MG CHEW chewable tablet Chew by mouth.   Cholecalciferol (VITAMIN D-3 PO) Take 400 Units by mouth daily.    cyclobenzaprine (FLEXERIL) 10 MG tablet Take 1 tablet (10 mg total) by mouth 2 (two) times daily as needed for muscle spasms.   diclofenac Sodium (VOLTAREN) 1 % GEL Apply 4 g topically 4 (four) times daily as needed. (Patient not taking: Reported on 09/30/2019)   EUTHYROX 75 MCG tablet TAKE 1 TABLET BY MOUTH ONCE DAILY BEFORE BREAKFAST   fish oil-omega-3 fatty  acids 1000 MG capsule Take 2 g by mouth daily.   fluticasone (FLONASE) 50 MCG/ACT nasal spray Place 2 sprays into both nostrils daily.   glucose blood (ACCU-CHEK GUIDE) test strip Use as instructed daily   guaiFENesin (MUCINEX) 600 MG 12 hr tablet Take 1 tablet (600 mg total) by mouth 2 (two) times daily.   hydrochlorothiazide (MICROZIDE) 12.5 MG capsule Take 1 capsule (12.5 mg total) by mouth daily.   magnesium oxide (MAG-OX) 400 MG tablet Take 400 mg by mouth daily.    meclizine (ANTIVERT) 25 MG tablet Take 1 tablet (25 mg total) by mouth 3 (three) times daily as needed for dizziness.   meloxicam (MOBIC) 15 MG tablet Take 1 tablet by mouth once daily   metFORMIN (GLUCOPHAGE) 500 MG tablet Take 2 tablets (1,000 mg total) by mouth 2 (two) times daily with a meal.   metroNIDAZOLE (FLAGYL) 500 MG tablet Take 1 tablet (500 mg total) by mouth 2 (two) times daily.   metroNIDAZOLE (METROGEL VAGINAL) 0.75 % vaginal gel Place 1 Applicatorful vaginally at bedtime.   OVER THE COUNTER MEDICATION Chlor Tab, two tablets daily for allergies.   Facility-Administered Encounter Medications as of 10/23/2020  Medication   0.9 %  sodium chloride infusion     Review of Systems  Review of Systems     Physical Exam  There were no vitals taken for this  visit.  Wt Readings from Last 5 Encounters:  10/10/20 225 lb (102.1 kg)  04/18/20 212 lb 9.6 oz (96.4 kg)  01/03/20 216 lb (98 kg)  09/30/19 223 lb (101.2 kg)  06/17/19 220 lb (99.8 kg)     Physical Exam   Lab Results:  CBC    Component Value Date/Time   WBC 3.8 (L) 10/10/2020 1242   RBC 4.71 10/10/2020 1242   HGB 13.0 10/10/2020 1242   HGB 12.6 02/26/2017 1113   HCT 39.6 10/10/2020 1242   HCT 38.0 02/26/2017 1113   PLT 242 10/10/2020 1242   PLT 247 02/26/2017 1113   MCV 84.1 10/10/2020 1242   MCV 83 02/26/2017 1113   MCH 27.6 10/10/2020 1242   MCHC 32.8 10/10/2020 1242   RDW 13.9 10/10/2020 1242   RDW 14.4 02/26/2017 1113    LYMPHSABS 1.5 03/05/2018 1424   LYMPHSABS 1.8 02/26/2017 1113   MONOABS 0.4 03/05/2018 1424   EOSABS 0.2 03/05/2018 1424   EOSABS 0.2 02/26/2017 1113   BASOSABS 0.0 03/05/2018 1424   BASOSABS 0.0 02/26/2017 1113    BMET    Component Value Date/Time   NA 140 10/10/2020 1242   NA 140 04/18/2020 1650   K 3.8 10/10/2020 1242   CL 105 10/10/2020 1242   CO2 28 10/10/2020 1242   GLUCOSE 83 10/10/2020 1242   BUN 21 10/10/2020 1242   BUN 16 04/18/2020 1650   CREATININE 0.87 10/10/2020 1242   CREATININE 0.87 10/08/2013 1130   CALCIUM 9.8 10/10/2020 1242   GFRNONAA >60 10/10/2020 1242   GFRNONAA 73 10/08/2013 1130   GFRAA 76 04/18/2020 1650   GFRAA 84 10/08/2013 1130    BNP No results found for: BNP  ProBNP No results found for: PROBNP  Imaging: DG Chest 2 View  Result Date: 10/10/2020 CLINICAL DATA:  Chest pain EXAM: CHEST - 2 VIEW COMPARISON:  None. FINDINGS: Lungs are clear. Heart is upper normal in size with pulmonary vascularity normal. No adenopathy. No pneumothorax. No bone lesions. IMPRESSION: Lungs clear.  Heart upper normal in size. Electronically Signed   By: Lowella Grip III M.D.   On: 10/10/2020 14:25     Assessment & Plan:   No problem-specific Assessment & Plan notes found for this encounter.     Fenton Foy, NP 10/23/2020

## 2020-10-24 DIAGNOSIS — Z8616 Personal history of COVID-19: Secondary | ICD-10-CM | POA: Insufficient documentation

## 2020-10-24 DIAGNOSIS — R002 Palpitations: Secondary | ICD-10-CM | POA: Insufficient documentation

## 2020-10-24 DIAGNOSIS — R079 Chest pain, unspecified: Secondary | ICD-10-CM | POA: Insufficient documentation

## 2020-10-24 HISTORY — DX: Palpitations: R00.2

## 2020-10-24 NOTE — Assessment & Plan Note (Signed)
Chest pain Palpitations Fatigue:   Stay well hydrated  Stay active  Deep breathing exercises  May take tylenol for fever or pain  Will place referral to cardiology for further evaluation   Follow up:  Follow up if needed

## 2020-10-27 ENCOUNTER — Other Ambulatory Visit: Payer: Self-pay | Admitting: Family Medicine

## 2020-10-27 DIAGNOSIS — G8929 Other chronic pain: Secondary | ICD-10-CM

## 2020-10-27 NOTE — Telephone Encounter (Signed)
  Notes to clinic:  Review for continued use and refill    Requested Prescriptions  Pending Prescriptions Disp Refills   meloxicam (MOBIC) 15 MG tablet [Pharmacy Med Name: Meloxicam 15 MG Oral Tablet] 30 tablet 0    Sig: Take 1 tablet by mouth once daily      Analgesics:  COX2 Inhibitors Passed - 10/27/2020  5:30 AM      Passed - HGB in normal range and within 360 days    Hemoglobin  Date Value Ref Range Status  10/10/2020 13.0 12.0 - 15.0 g/dL Final  02/26/2017 12.6 11.1 - 15.9 g/dL Final          Passed - Cr in normal range and within 360 days    Creat  Date Value Ref Range Status  10/08/2013 0.87 0.50 - 1.10 mg/dL Final   Creatinine, Ser  Date Value Ref Range Status  10/10/2020 0.87 0.44 - 1.00 mg/dL Final          Passed - Patient is not pregnant      Passed - Valid encounter within last 12 months    Recent Outpatient Visits           1 month ago COVID-19 virus infection   [place], [place], MD   6 months ago Type 2 diabetes mellitus with other neurologic complication, without long-term current use of insulin [facility])   [place], [place], [place]   9 months ago Type 2 diabetes mellitus with other neurologic complication, without long-term current use of insulin ([place])   [place], [place], MD   1 year ago Type 2 diabetes mellitus with other specified complication, without long-term current use of insulin ([place])   [place], [place], MD   1 year ago Acute intractable headache, unspecified headache type   [place], [place], [place]

## 2020-12-03 ENCOUNTER — Other Ambulatory Visit: Payer: Self-pay | Admitting: Family Medicine

## 2020-12-03 DIAGNOSIS — M545 Low back pain, unspecified: Secondary | ICD-10-CM

## 2020-12-03 DIAGNOSIS — G8929 Other chronic pain: Secondary | ICD-10-CM

## 2020-12-03 NOTE — Telephone Encounter (Signed)
Requested Prescriptions  Pending Prescriptions Disp Refills  . meloxicam (MOBIC) 15 MG tablet [Pharmacy Med Name: Meloxicam 15 MG Oral Tablet] 30 tablet 0    Sig: Take 1 tablet by mouth once daily     Analgesics:  COX2 Inhibitors Passed - 12/03/2020 12:59 PM      Passed - HGB in normal range and within 360 days    Hemoglobin  Date Value Ref Range Status  10/10/2020 13.0 12.0 - 15.0 g/dL Final  02/26/2017 12.6 11.1 - 15.9 g/dL Final         Passed - Cr in normal range and within 360 days    Creat  Date Value Ref Range Status  10/08/2013 0.87 0.50 - 1.10 mg/dL Final   Creatinine, Ser  Date Value Ref Range Status  10/10/2020 0.87 0.44 - 1.00 mg/dL Final         Passed - Patient is not pregnant      Passed - Valid encounter within last 12 months    Recent Outpatient Visits          2 months ago COVID-19 virus infection   [place], [place], MD   7 months ago Type 2 diabetes mellitus with other neurologic complication, without long-term current use of insulin [facility])   [place], [place], [place]   11 months ago Type 2 diabetes mellitus with other neurologic complication, without long-term current use of insulin ([place])   [place], [place], MD   1 year ago Type 2 diabetes mellitus with other specified complication, without long-term current use of insulin ([place])   [place], Charlane Ferretti, MD   1 year ago Acute intractable headache, unspecified headache type   [place], Dionne [place], [place]      Future Appointments            In 1 month Tamala Julian, Lynnell Dike, MD [facility], [facility]

## 2020-12-11 ENCOUNTER — Other Ambulatory Visit: Payer: Self-pay

## 2020-12-11 ENCOUNTER — Emergency Department (HOSPITAL_COMMUNITY)
Admission: EM | Admit: 2020-12-11 | Discharge: 2020-12-11 | Disposition: A | Discharge status: Home / Self Care | Payer: 59 | Attending: Emergency Medicine | Admitting: Emergency Medicine

## 2020-12-11 ENCOUNTER — Ambulatory Visit: Payer: Self-pay | Admitting: *Deleted

## 2020-12-11 DIAGNOSIS — Z7984 Long term (current) use of oral hypoglycemic drugs: Secondary | ICD-10-CM | POA: Insufficient documentation

## 2020-12-11 DIAGNOSIS — Z7982 Long term (current) use of aspirin: Secondary | ICD-10-CM | POA: Insufficient documentation

## 2020-12-11 DIAGNOSIS — Z8616 Personal history of COVID-19: Secondary | ICD-10-CM | POA: Insufficient documentation

## 2020-12-11 DIAGNOSIS — I10 Essential (primary) hypertension: Secondary | ICD-10-CM | POA: Diagnosis not present

## 2020-12-11 DIAGNOSIS — Z79899 Other long term (current) drug therapy: Secondary | ICD-10-CM | POA: Insufficient documentation

## 2020-12-11 DIAGNOSIS — M79661 Pain in right lower leg: Secondary | ICD-10-CM | POA: Diagnosis present

## 2020-12-11 DIAGNOSIS — Z87891 Personal history of nicotine dependence: Secondary | ICD-10-CM | POA: Diagnosis not present

## 2020-12-11 DIAGNOSIS — M79604 Pain in right leg: Secondary | ICD-10-CM

## 2020-12-11 DIAGNOSIS — E039 Hypothyroidism, unspecified: Secondary | ICD-10-CM | POA: Insufficient documentation

## 2020-12-11 MED ORDER — ENOXAPARIN SODIUM 100 MG/ML IJ SOSY
1.0000 mg/kg | PREFILLED_SYRINGE | Freq: Once | INTRAMUSCULAR | Status: AC
  Administered 2020-12-11: 95 mg via SUBCUTANEOUS
  Filled 2020-12-11: qty 0.95

## 2020-12-11 MED ORDER — HYDROCODONE-ACETAMINOPHEN 5-325 MG PO TABS: 1.0000 | ORAL_TABLET | ORAL | 0 refills | Status: AC | PRN

## 2020-12-11 NOTE — Telephone Encounter (Signed)
Reason for Disposition  [1] Can't walk or can barely walk AND [2] new-onset    Right leg is swollen, warmer and painful.  Had to use a wheelchair in the airport yesterday due to inability to walk due to the pain.  Answer Assessment - Initial Assessment Questions 1. ONSET: "When did the swelling start?" (e.g., minutes, hours, days)     My right leg is swollen.   I traveled yesterday.   I felt it yesterday when I got up.    I was flying.   I could not walk had to use a wheelchair in the airport yesterday.    I arrived home last night.   I put a pain patch on it last night. 2. LOCATION: "What part of the leg is swollen?"  "Are both legs swollen or just one leg?"     Right inside above my knee.   It's swollen.   To the left towards my inner thigh is tender.    I flew out on [place].   I was fine.   But yesterday I woke up with the pain in my leg.   No history blood clots.    3. SEVERITY: "How bad is the swelling?" (e.g., localized; mild, moderate, severe)  - Localized - small area of swelling localized to one leg  - MILD pedal edema - swelling limited to foot and ankle, pitting edema < 1/4 inch (6 mm) deep, rest and elevation eliminate most or all swelling  - MODERATE edema - swelling of lower leg to knee, pitting edema > 1/4 inch (6 mm) deep, rest and elevation only partially reduce swelling  - SEVERE edema - swelling extends above knee, facial or hand swelling present      Yesterday it really was sore and painful.   I put the pain patch on my leg.   5 on pain scale.    I did not go to work today.   4. REDNESS: "Does the swelling look red or infected?"     It's warmer than my left leg.   5. PAIN: "Is the swelling painful to touch?" If Yes, ask: "How painful is it?"   (Scale 1-10; mild, moderate or severe)     5 6. FEVER: "Do you have a fever?" If Yes, ask: "What is it, how was it measured, and when did it start?"      No  7. CAUSE: "What do you think is causing the leg swelling?"     I'm not  sure 8. MEDICAL HISTORY: "Do you have a history of heart failure, kidney disease, liver failure, or cancer?"     No history of blood clots.   Not on blood thinners. 9. RECURRENT SYMPTOM: "Have you had leg swelling before?" If Yes, ask: "When was the last time?" "What happened that time?"     No 10. OTHER SYMPTOMS: "Do you have any other symptoms?" (e.g., chest pain, difficulty breathing)       Denies shortness of breath or chest tightness. 11. PREGNANCY: "Is there any chance you are pregnant?" "When was your last menstrual period?"       Not asked due to age  Protocols used: Leg Swelling and Edema-A-AH

## 2020-12-11 NOTE — Discharge Instructions (Addendum)
Return tomorrow as instructed for a formal doppler study. You can take Norco for pain and can also continue your Meloxicam as well.

## 2020-12-11 NOTE — ED Triage Notes (Signed)
Pt came from home via POV. C/c: right knee pain that radiates to right side of groin. Pt states pain started yesterday morning, pt had to fly back home, and pain got worse. Pain unrelieved by topical pain patch. Pt reports having a virtual appt with PCP this AM and they advised her to come here to rule out DVT. Pt ambulatory with pain in triage.

## 2020-12-11 NOTE — Telephone Encounter (Signed)
Pt called in c/o right leg pain, swelling and tenderness after flying home yesterday.   Her right leg was in so much pain yesterday she had to use a wheelchair in the airport.   She flew out Thursday and flew back to [facility] yesterday afternoon.  When I woke up yesterday my leg was swollen and painful.   See triage notes.  I have referred her to the ED per the protocol.   Pt was agreeable to going and is going to [facility] now.  I instructed her to remove the pain patch ([place]) and not to rub her leg.     I sent my notes to Dr. Margarita Rana at [facility].

## 2020-12-11 NOTE — ED Provider Notes (Signed)
Post89 year old female complaining of some right knee pain going up into her groin that started yesterday.  Just returned from a plane flight from [place].  Unfortunately by the time she got back into the treatment area vascular ultrasound has gone home.  I did a bedside ultrasound that did not show any obvious clot with compressible femoral and popliteal veins.  She will need a formal ultrasound tomorrow for confirmation.  Ultrasound ED DVT  Date/Time: 12/11/2020 10:59 PM Performed by: Hayden Rasmussen, MD Authorized by: Hayden Rasmussen, MD   Procedure details:    Indications: lower extremity pain     Assessment for:  DVT   Images Archived: No     Limitations:  None RLE Findings:    Right common femoral vein:  Compressible   Right popliteal vein:  Compressible IMPRESSION:   DVT:     None    Hayden Rasmussen, MD 12/12/20 [phone]

## 2020-12-11 NOTE — ED Provider Notes (Signed)
[facility] DEPT Provider Note   CSN: [id number] Arrival date & time: 12/11/20  1316     History Chief Complaint  Patient presents with   Knee Pain    Stacy Bryan is a 65 y.o. female.  Patient with history of HTN, HLD, GERD, varicose veins, thyroid disease presents with pain that started 2 days ago in the right medial thigh from the knee to the groin. No swelling or redness. She reports having a "charlie horse" in the right calf briefly yesterday but otherwise no calf pain. No history of clots. She is not anticoagulated. No known injury. She states she just completed a trip to [place] in the last week, returning yesterday, by air travel.   The history is provided by the patient. No language interpreter was used.  Knee Pain Associated symptoms: no fever       Past Medical History:  Diagnosis Date   Abnormal Pap smear    Allergy    Anemia    GERD (gastroesophageal reflux disease)    Hyperlipidemia    Hypertension    Thyroid disease    Vertigo     Patient Active Problem List   Diagnosis Date Noted   History of COVID-19 10/24/2020   Palpitations 10/24/2020   Intermittent chest pain 10/24/2020   Hypothyroidism, postablative 09/29/2017   Hyperthyroidism 02/26/2017   Vertigo 02/26/2017   Chronic venous insufficiency 01/10/2017   Varicose veins of both lower extremities with complications 25/95/6387   Preventative health care 07/04/2014   Chronic maxillary sinusitis 07/04/2014   Midline low back pain without sciatica 10/08/2013   Encounter to establish care 10/08/2013   Other specified hypothyroidism 10/08/2013   Breast cancer screening 10/08/2013   Colon cancer screening 10/08/2013   Subclinical hypothyroidism 10/02/2011   HTN (hypertension), benign 06/01/2011   Dyslipidemia 06/01/2011   GERD (gastroesophageal reflux disease) 06/01/2011   History of seasonal allergies 06/01/2011    Past Surgical History:  Procedure Laterality Date    ABDOMINAL HYSTERECTOMY  1997   total   FOOT SURGERY     both feet  20 yrs ago   OVARY SURGERY  1980's   removal of R ovary (cyst)     OB History   No obstetric history on file.     Family History  Problem Relation Age of Onset   Hypertension Mother    Heart disease Father    Asthma Father    Hyperlipidemia Father    Asthma Sister    Breast cancer Sister    Stomach cancer Sister    Thyroid disease Sister        Surgery   Heart disease Sister    Diabetes Sister    Gout Sister    Diabetes Sister    Ulcers Sister    Cancer Maternal Grandmother    Colon cancer Neg Hx    Esophageal cancer Neg Hx    Liver cancer Neg Hx    Pancreatic cancer Neg Hx    Rectal cancer Neg Hx     Social History   Tobacco Use   Smoking status: Former    Types: Cigarettes    Quit date: 06/02/2006    Years since quitting: 14.5   Smokeless tobacco: Never  Vaping Use   Vaping Use: Never used  Substance Use Topics   Alcohol use: Yes    Comment: occassionally   Drug use: No    Home Medications Prior to Admission medications   Medication Sig Start Date End  Date Taking? Authorizing Provider  Vitamin D, Ergocalciferol, (DRISDOL) 1.25 MG (50000 UNIT) CAPS capsule Take 1 capsule (50,000 Units total) by mouth every 7 (seven) days. 04/19/20   Argentina Donovan, PA-C  aspirin EC 81 MG tablet Take 81 mg by mouth daily.    [provider]  atorvastatin (LIPITOR) 20 MG tablet Take 1 tablet by mouth once daily 10/18/20   Charlott Rakes, MD  Blood Glucose Monitoring Suppl (ACCU-CHEK GUIDE ME) w/Device KIT Use daily 09/30/19   Charlott Rakes, MD  calcium carbonate (TITRALAC) 420 MG CHEW chewable tablet Chew by mouth.    [provider]  Cholecalciferol (VITAMIN D-3 PO) Take 400 Units by mouth daily.     [provider]  cyclobenzaprine (FLEXERIL) 10 MG tablet Take 1 tablet (10 mg total) by mouth 2 (two) times daily as needed for muscle spasms. 09/30/19   Charlott Rakes, MD   diclofenac Sodium (VOLTAREN) 1 % GEL Apply 4 g topically 4 (four) times daily as needed. Patient not taking: Reported on 09/30/2019 06/08/19   Hilts, Legrand Como, MD  EUTHYROX 75 MCG tablet TAKE 1 TABLET BY MOUTH ONCE DAILY BEFORE BREAKFAST 07/31/20   Elayne Snare, MD  fish oil-omega-3 fatty acids 1000 MG capsule Take 2 g by mouth daily.    [provider]  fluticasone (FLONASE) 50 MCG/ACT nasal spray Place 2 sprays into both nostrils daily. 04/18/20   Argentina Donovan, PA-C  glucose blood (ACCU-CHEK GUIDE) test strip Use as instructed daily 09/30/19   Charlott Rakes, MD  guaiFENesin (MUCINEX) 600 MG 12 hr tablet Take 1 tablet (600 mg total) by mouth 2 (two) times daily. 09/18/20   Couture, Cortni S, PA-C  hydrochlorothiazide (MICROZIDE) 12.5 MG capsule Take 1 capsule (12.5 mg total) by mouth daily. 09/30/19   Charlott Rakes, MD  magnesium oxide (MAG-OX) 400 MG tablet Take 400 mg by mouth daily.     [provider]  meclizine (ANTIVERT) 25 MG tablet Take 1 tablet (25 mg total) by mouth 3 (three) times daily as needed for dizziness. 09/30/19   Charlott Rakes, MD  meloxicam (MOBIC) 15 MG tablet Take 1 tablet by mouth once daily 12/03/20   Charlott Rakes, MD  metFORMIN (GLUCOPHAGE) 500 MG tablet Take 2 tablets (1,000 mg total) by mouth 2 (two) times daily with a meal. 04/19/20   McClung, Dionne Bucy, PA-C  metroNIDAZOLE (FLAGYL) 500 MG tablet Take 1 tablet (500 mg total) by mouth 2 (two) times daily. 01/04/20   Charlott Rakes, MD  metroNIDAZOLE (METROGEL VAGINAL) 0.75 % vaginal gel Place 1 Applicatorful vaginally at bedtime. 01/04/20   Charlott Rakes, MD  OVER THE COUNTER MEDICATION Chlor Tab, two tablets daily for allergies.    [provider]    Allergies    Patient has no known allergies.  Review of Systems   Review of Systems  Constitutional:  Negative for chills and fever.  Respiratory: Negative.  Negative for shortness of breath.   Cardiovascular: Negative.  Negative for chest  pain.  Gastrointestinal: Negative.   Musculoskeletal:        See HPI.  Skin: Negative.  Negative for color change.  Neurological: Negative.  Negative for weakness and numbness.   Physical Exam Updated Vital Signs BP (!) 152/83 (BP Location: Right Arm)   Pulse (!) 58   Temp 98.2 F (36.8 C) (Oral)   Resp 16   Ht '5\' 10"'  (1.778 m)   Wt 95.1 kg   SpO2 98%   BMI 30.07 kg/m  Physical Exam Constitutional:      General: She is not in acute distress.    Appearance: She is well-developed. She is not ill-appearing.  Pulmonary:     Effort: Pulmonary effort is normal.  Musculoskeletal:        General: Normal range of motion.     Cervical back: Normal range of motion.       Legs:     Comments: Tenderness without swelling, redness to the right medial thigh. No calf swelling/tenderness. Distal pulses intact.  Skin:    General: Skin is warm and dry.  Neurological:     Mental Status: She is alert and oriented to person, place, and time.     Sensory: No sensory deficit.    ED Results / Procedures / Treatments   Labs (all labs ordered are listed, but only abnormal results are displayed) Labs Reviewed - No data to display  EKG None  Radiology No results found.  Procedures Procedures   Medications Ordered in ED Medications - No data to display  ED Course  I have reviewed the triage vital signs and the nursing notes.  Pertinent labs & imaging results that were available during my care of the patient were reviewed by me and considered in my medical decision making (see chart for details).    MDM Rules/Calculators/A&P                           Patient to ED with ss/sxs as per HPI.   Her exam is reassuring, without redness or swelling. No calf tenderness. A bedside US was performed by Dr. Melina Copa without evidence of significant thrombosis. No SOB of chest pain to suggest PE.   Discussed the importance of returning in the morning for a scheduled doppler study. Will give  Lovenox tonight. Will provide pain medication.   Final Clinical Impression(s) / ED Diagnoses Final diagnoses:  None   Right LE pain  Rx / DC Orders ED Discharge Orders     None        Dennie Bible 12/11/20 2307    Tegeler, Gwenyth Allegra, MD 12/12/20 [phone]

## 2020-12-11 NOTE — Telephone Encounter (Signed)
Routing to PCP for review.

## 2020-12-12 ENCOUNTER — Ambulatory Visit (HOSPITAL_COMMUNITY)
Admission: RE | Admit: 2020-12-12 | Discharge: 2020-12-12 | Disposition: A | Discharge status: Home / Self Care | Payer: 59 | Source: Ambulatory Visit | Attending: Emergency Medicine | Admitting: Emergency Medicine

## 2020-12-12 DIAGNOSIS — M79651 Pain in right thigh: Secondary | ICD-10-CM | POA: Insufficient documentation

## 2020-12-12 NOTE — Progress Notes (Signed)
Right lower extremity venous duplex completed. Refer to "CV Proc" under chart review to view preliminary results.  12/12/2020 11:35 AM Kelby Aline., MHA, RVT, RDCS, RDMS

## 2021-01-07 NOTE — Progress Notes (Deleted)
Cardiology Office Note:    Date:  01/07/2021   ID:  Stacy Bryan, DOB 03-21-1956, MRN [id number]  PCP:  Charlott Rakes, MD  Cardiologist:  None   Referring MD: Fenton Foy, NP   No chief complaint on file.   History of Present Illness:    Stacy Bryan is a 65 y.o. female with a hx of referred for evaluation of chest pain and palpitations.  ***  Past Medical History:  Diagnosis Date   Abnormal Pap smear    Allergy    Anemia    GERD (gastroesophageal reflux disease)    Hyperlipidemia    Hypertension    Thyroid disease    Vertigo     Past Surgical History:  Procedure Laterality Date   ABDOMINAL HYSTERECTOMY  1997   total   FOOT SURGERY     both feet  20 yrs ago   OVARY SURGERY  1980's   removal of R ovary (cyst)    Current Medications: No outpatient medications have been marked as taking for the 01/09/21 encounter (Appointment) with Belva Crome, MD.   Current Facility-Administered Medications for the 01/09/21 encounter (Appointment) with Belva Crome, MD  Medication   0.9 %  sodium chloride infusion     Allergies:   Patient has no known allergies.   Social History   Socioeconomic History   Marital status: Single    Spouse name: Not on file   Number of children: Not on file   Years of education: Not on file   Highest education level: Not on file  Occupational History   Not on file  Tobacco Use   Smoking status: Former    Types: Cigarettes    Quit date: 06/02/2006    Years since quitting: 14.6   Smokeless tobacco: Never  Vaping Use   Vaping Use: Never used  Substance and Sexual Activity   Alcohol use: Yes    Comment: occassionally   Drug use: No   Sexual activity: Not on file  Other Topics Concern   Not on file  Social History Narrative   Not on file   Social Determinants of Health   Financial Resource Strain: Not on file  Food Insecurity: Not on file  Transportation Needs: Not on file  Physical Activity: Not on file   Stress: Not on file  Social Connections: Not on file     Family History: The patient's family history includes Asthma in her father and sister; Breast cancer in her sister; Cancer in her maternal grandmother; Diabetes in her sister and sister; Gout in her sister; Heart disease in her father and sister; Hyperlipidemia in her father; Hypertension in her mother; Stomach cancer in her sister; Thyroid disease in her sister; Ulcers in her sister. There is no history of Colon cancer, Esophageal cancer, Liver cancer, Pancreatic cancer, or Rectal cancer.  ROS:   Please see the history of present illness.    *** All other systems reviewed and are negative.  EKGs/Labs/Other Studies Reviewed:    The following studies were reviewed today: ***  EKG:  EKG ***  Recent Labs: 04/18/2020: ALT 19; TSH 3.270 10/10/2020: BUN 21; Creatinine, Ser 0.87; Hemoglobin 13.0; Platelets 242; Potassium 3.8; Sodium 140  Recent Lipid Panel    Component Value Date/Time   CHOL 224 (H) 09/30/2019 1050   TRIG 207 (H) 09/30/2019 1050   HDL 50 09/30/2019 1050   CHOLHDL 4.5 (H) 09/30/2019 1050   CHOLHDL 4.8 10/08/2013 1130   VLDL  35 10/08/2013 1130   LDLCALC 137 (H) 09/30/2019 1050    Physical Exam:    VS:  There were no vitals taken for this visit.    Wt Readings from Last 3 Encounters:  12/11/20 209 lb 9.6 oz (95.1 kg)  10/10/20 225 lb (102.1 kg)  04/18/20 212 lb 9.6 oz (96.4 kg)     GEN: ***. No acute distress HEENT: Normal NECK: No JVD. LYMPHATICS: No lymphadenopathy CARDIAC: *** murmur. RRR *** gallop, or edema. VASCULAR: *** Normal Pulses. No bruits. RESPIRATORY:  Clear to auscultation without rales, wheezing or rhonchi  ABDOMEN: Soft, non-tender, non-distended, No pulsatile mass, MUSCULOSKELETAL: No deformity  SKIN: Warm and dry NEUROLOGIC:  Alert and oriented x 3 PSYCHIATRIC:  Normal affect   ASSESSMENT:    1. Chest pain of uncertain etiology   2. Palpitations   3. HTN (hypertension), benign    4. Chronic venous insufficiency   5. Dyslipidemia   6. Hyperthyroidism    PLAN:    In order of problems listed above:  ***   Medication Adjustments/Labs and Tests Ordered: Current medicines are reviewed at length with the patient today.  Concerns regarding medicines are outlined above.  No orders of the defined types were placed in this encounter.  No orders of the defined types were placed in this encounter.   There are no Patient Instructions on file for this visit.   Signed, Sinclair Grooms, MD  01/07/2021 5:18 PM    [facility]

## 2021-01-09 ENCOUNTER — Ambulatory Visit: Payer: 59 | Admitting: Interventional Cardiology

## 2021-01-09 DIAGNOSIS — E785 Hyperlipidemia, unspecified: Secondary | ICD-10-CM

## 2021-01-09 DIAGNOSIS — I10 Essential (primary) hypertension: Secondary | ICD-10-CM

## 2021-01-09 DIAGNOSIS — R079 Chest pain, unspecified: Secondary | ICD-10-CM

## 2021-01-09 DIAGNOSIS — I872 Venous insufficiency (chronic) (peripheral): Secondary | ICD-10-CM

## 2021-01-09 DIAGNOSIS — R002 Palpitations: Secondary | ICD-10-CM

## 2021-01-09 DIAGNOSIS — E0590 Thyrotoxicosis, unspecified without thyrotoxic crisis or storm: Secondary | ICD-10-CM

## 2021-01-21 ENCOUNTER — Other Ambulatory Visit: Payer: Self-pay | Admitting: Family Medicine

## 2021-01-21 DIAGNOSIS — I10 Essential (primary) hypertension: Secondary | ICD-10-CM

## 2021-01-21 NOTE — Telephone Encounter (Signed)
Requested Prescriptions  Pending Prescriptions Disp Refills  . hydrochlorothiazide (MICROZIDE) 12.5 MG capsule [Pharmacy Med Name: hydroCHLOROthiazide 12.5 MG Oral Capsule] 90 capsule 1    Sig: Take 1 capsule by mouth once daily     Cardiovascular: Diuretics - Thiazide Failed - 01/21/2021  5:18 PM      Failed - Last BP in normal range    BP Readings from Last 1 Encounters:  12/11/20 (!) 149/79         Passed - Ca in normal range and within 360 days    Calcium  Date Value Ref Range Status  10/10/2020 9.8 8.9 - 10.3 mg/dL Final         Passed - Cr in normal range and within 360 days    Creat  Date Value Ref Range Status  10/08/2013 0.87 0.50 - 1.10 mg/dL Final   Creatinine, Ser  Date Value Ref Range Status  10/10/2020 0.87 0.44 - 1.00 mg/dL Final         Passed - K in normal range and within 360 days    Potassium  Date Value Ref Range Status  10/10/2020 3.8 3.5 - 5.1 mmol/L Final         Passed - Na in normal range and within 360 days    Sodium  Date Value Ref Range Status  10/10/2020 140 135 - 145 mmol/L Final  04/18/2020 140 134 - 144 mmol/L Final         Passed - Valid encounter within last 6 months    Recent Outpatient Visits          4 months ago COVID-19 virus infection   [place], Vesta, MD   9 months ago Type 2 diabetes mellitus with other neurologic complication, without long-term current use of insulin [facility])   [place], [place], [place]   1 year ago Type 2 diabetes mellitus with other neurologic complication, without long-term current use of insulin ([place])   [place], Hepburn, MD   1 year ago Type 2 diabetes mellitus with other specified complication, without long-term current use of insulin ([place])   [place], Charlane Ferretti, MD   1 year ago Acute intractable headache, unspecified headache type   [place], [place]      Future Appointments            In 2 days Charlott Rakes, MD [facility]   In 1 month Tamala Julian, Lynnell Dike, MD [facility], [facility]

## 2021-01-23 ENCOUNTER — Other Ambulatory Visit: Payer: Self-pay

## 2021-01-23 ENCOUNTER — Encounter: Payer: Self-pay | Admitting: Family Medicine

## 2021-01-23 ENCOUNTER — Ambulatory Visit: Payer: 59 | Attending: Family Medicine | Admitting: Family Medicine

## 2021-01-23 ENCOUNTER — Telehealth: Payer: Self-pay | Admitting: Family Medicine

## 2021-01-23 VITALS — BP 134/78 | HR 60 | Ht 70.0 in | Wt 212.6 lb

## 2021-01-23 DIAGNOSIS — E785 Hyperlipidemia, unspecified: Secondary | ICD-10-CM

## 2021-01-23 DIAGNOSIS — M5441 Lumbago with sciatica, right side: Secondary | ICD-10-CM

## 2021-01-23 DIAGNOSIS — F32A Depression, unspecified: Secondary | ICD-10-CM | POA: Diagnosis not present

## 2021-01-23 DIAGNOSIS — F439 Reaction to severe stress, unspecified: Secondary | ICD-10-CM

## 2021-01-23 DIAGNOSIS — Z23 Encounter for immunization: Secondary | ICD-10-CM

## 2021-01-23 DIAGNOSIS — E1159 Type 2 diabetes mellitus with other circulatory complications: Secondary | ICD-10-CM

## 2021-01-23 DIAGNOSIS — G8929 Other chronic pain: Secondary | ICD-10-CM

## 2021-01-23 DIAGNOSIS — F419 Anxiety disorder, unspecified: Secondary | ICD-10-CM | POA: Diagnosis not present

## 2021-01-23 DIAGNOSIS — E1169 Type 2 diabetes mellitus with other specified complication: Secondary | ICD-10-CM | POA: Diagnosis not present

## 2021-01-23 DIAGNOSIS — Z1159 Encounter for screening for other viral diseases: Secondary | ICD-10-CM

## 2021-01-23 DIAGNOSIS — I152 Hypertension secondary to endocrine disorders: Secondary | ICD-10-CM

## 2021-01-23 LAB — POCT GLYCOSYLATED HEMOGLOBIN (HGB A1C): HbA1c, POC (controlled diabetic range): 6.6 % (ref 0.0–7.0)

## 2021-01-23 MED ORDER — ATORVASTATIN CALCIUM 20 MG PO TABS: 20.0000 mg | ORAL_TABLET | Freq: Every day | ORAL | 1 refills | Status: DC

## 2021-01-23 MED ORDER — HYDROCHLOROTHIAZIDE 12.5 MG PO CAPS: 12.5000 mg | ORAL_CAPSULE | Freq: Every day | ORAL | 1 refills | Status: DC

## 2021-01-23 MED ORDER — MELOXICAM 15 MG PO TABS: 15.0000 mg | ORAL_TABLET | Freq: Every day | ORAL | 3 refills | Status: DC

## 2021-01-23 MED ORDER — DULOXETINE HCL 60 MG PO CPEP: 60.0000 mg | ORAL_CAPSULE | Freq: Every day | ORAL | 3 refills | Status: AC

## 2021-01-23 MED ORDER — METFORMIN HCL 500 MG PO TABS: 500.0000 mg | ORAL_TABLET | Freq: Two times a day (BID) | ORAL | 1 refills | Status: DC

## 2021-01-23 MED ORDER — CYCLOBENZAPRINE HCL 10 MG PO TABS: 10.0000 mg | ORAL_TABLET | Freq: Two times a day (BID) | ORAL | 1 refills | Status: AC | PRN

## 2021-01-23 NOTE — Patient Instructions (Signed)
Sciatica Rehab °Ask your health care provider which exercises are safe for you. Do exercises exactly as told by your health care provider and adjust them as directed. It is normal to feel mild stretching, pulling, tightness, or discomfort as you do these exercises. Stop right away if you feel sudden pain or your pain gets worse. Do not begin these exercises until told by your health care provider. °Stretching and range-of-motion exercises °These exercises warm up your muscles and joints and improve the movement and flexibility of your hips and back. These exercises also help to relieve pain, numbness, and tingling. °Sciatic nerve glide °Sit in a chair with your head facing down toward your chest. Place your hands behind your back. Let your shoulders slump forward. °Slowly straighten one of your legs while you tilt your head back as if you are looking toward the ceiling. Only straighten your leg as far as you can without making your symptoms worse. °Hold this position for __________ seconds. °Slowly return to the starting position. °Repeat with your other leg. °Repeat __________ times. Complete this exercise __________ times a day. °Knee to chest with hip adduction and internal rotation ° °Lie on your back on a firm surface with both legs straight. °Bend one of your knees and move it up toward your chest until you feel a gentle stretch in your lower back and buttock. Then, move your knee toward the shoulder that is on the opposite side from your leg. This is hip adduction and internal rotation. °Hold your leg in this position by holding on to the front of your knee. °Hold this position for __________ seconds. °Slowly return to the starting position. °Repeat with your other leg. °Repeat __________ times. Complete this exercise __________ times a day. °Prone extension on elbows ° °Lie on your abdomen on a firm surface. A bed may be too soft for this exercise. °Prop yourself up on your elbows. °Use your arms to help  lift your chest up until you feel a gentle stretch in your abdomen and your lower back. °This will place some of your body weight on your elbows. If this is uncomfortable, try stacking pillows under your chest. °Your hips should stay down, against the surface that you are lying on. Keep your hip and back muscles relaxed. °Hold this position for __________ seconds. °Slowly relax your upper body and return to the starting position. °Repeat __________ times. Complete this exercise __________ times a day. °Strengthening exercises °These exercises build strength and endurance in your back. Endurance is the ability to use your muscles for a long time, even after they get tired. °Pelvic tilt °This exercise strengthens the muscles that lie deep in the abdomen. °Lie on your back on a firm surface. Bend your knees and keep your feet flat on the floor. °Tense your abdominal muscles. Tip your pelvis up toward the ceiling and flatten your lower back into the floor. °To help with this exercise, you may place a small towel under your lower back and try to push your back into the towel. °Hold this position for __________ seconds. °Let your muscles relax completely before you repeat this exercise. °Repeat __________ times. Complete this exercise __________ times a day. °Alternating arm and leg raises ° °Get on your hands and knees on a firm surface. If you are on a hard floor, you may want to use padding, such as an exercise mat, to cushion your knees. °Line up your arms and legs. Your hands should be directly below your shoulders,   and your knees should be directly below your hips. °Lift your left leg behind you. At the same time, raise your right arm and straighten it in front of you. °Do not lift your leg higher than your hip. °Do not lift your arm higher than your shoulder. °Keep your abdominal and back muscles tight. °Keep your hips facing the ground. °Do not arch your back. °Keep your balance carefully, and do not hold your  breath. °Hold this position for __________ seconds. °Slowly return to the starting position. °Repeat with your right leg and your left arm. °Repeat __________ times. Complete this exercise __________ times a day. °Posture and body mechanics °Good posture and healthy body mechanics can help to relieve stress in your body's tissues and joints. Body mechanics refers to the movements and positions of your body while you do your daily activities. Posture is part of body mechanics. Good posture means: °Your spine is in its natural S-curve position (neutral). °Your shoulders are pulled back slightly. °Your head is not tipped forward. °Follow these guidelines to improve your posture and body mechanics in your everyday activities. °Standing ° °When standing, keep your spine neutral and your feet about hip width apart. Keep a slight bend in your knees. Your ears, shoulders, and hips should line up. °When you do a task in which you stand in one place for a long time, place one foot up on a stable object that is 2-4 inches (5-10 cm) high, such as a footstool. This helps keep your spine neutral. °Sitting ° °When sitting, keep your spine neutral and keep your feet flat on the floor. Use a footrest, if necessary, and keep your thighs parallel to the floor. Avoid rounding your shoulders, and avoid tilting your head forward. °When working at a desk or a computer, keep your desk at a height where your hands are slightly lower than your elbows. Slide your chair under your desk so you are close enough to maintain good posture. °When working at a computer, place your monitor at a height where you are looking straight ahead and you do not have to tilt your head forward or downward to look at the screen. °Resting °When lying down and resting, avoid positions that are most painful for you. °If you have pain with activities such as sitting, bending, stooping, or squatting, lie in a position in which your body does not bend very much. For  example, avoid curling up on your side with your arms and knees near your chest (fetal position). °If you have pain with activities such as standing for a long time or reaching with your arms, lie with your spine in a neutral position and bend your knees slightly. Try the following positions: °Lying on your side with a pillow between your knees. °Lying on your back with a pillow under your knees. °Lifting ° °When lifting objects, keep your feet at least shoulder width apart and tighten your abdominal muscles. °Bend your knees and hips and keep your spine neutral. It is important to lift using the strength of your legs, not your back. Do not lock your knees straight out. °Always ask for help to lift heavy or awkward objects. °This information is not intended to replace advice given to you by your health care provider. Make sure you discuss any questions you have with your health care provider. °Document Revised: 07/24/2018 Document Reviewed: 04/23/2018 °Elsevier Patient Education © 2022 [employer] ° °

## 2021-01-23 NOTE — Progress Notes (Signed)
Subjective:  Patient ID: Stacy Bryan, female    DOB: [birth year]  Age: 65 y.o. MRN: [id number]  CC: Back Pain   HPI Stacy Bryan is a 65 y.o. year old female with a history of Type 2 DM (A1c 6.6), Hypertension, chronic low back pain, hypothyroidism (secondary to radioactive treatment for multinodular goiter), chronic venous insufficiency, vertigo.  Interval History: A1c 6.6 which is up from 6.2 previously.  She endorses emotional eating.  Taking metformin 500 mg twice daily rather than 1000 mg twice daily due to GI upset with the higher dose.  She has no hypoglycemic symptoms.  Endorses eating out a lot because she gets home late.  PHQ 9 is 19 today up from 17 previously. She is depressed and fatigued from work. Work has been more busy and she has taken some days off to help her stress but has noticed minimal benefit. States she wishes she could retire but she has bills to pay.  She has R sided low back pain and R hip pain which started last week and is sharp. She has to push herself to get up and needs to rest a bit before she gets going. Pain sometimes radiates to her R thigh. She has been doing a lot of Meloxicam and she takes Flexeril to go to bed at night. Compliant with her antihypertensive. Past Medical History:  Diagnosis Date   Abnormal Pap smear    Allergy    Anemia    GERD (gastroesophageal reflux disease)    Hyperlipidemia    Hypertension    Thyroid disease    Vertigo     Past Surgical History:  Procedure Laterality Date   ABDOMINAL HYSTERECTOMY  1997   total   FOOT SURGERY     both feet  20 yrs ago   OVARY SURGERY  1980's   removal of R ovary (cyst)    Family History  Problem Relation Age of Onset   Hypertension Mother    Heart disease Father    Asthma Father    Hyperlipidemia Father    Asthma Sister    Breast cancer Sister    Stomach cancer Sister    Thyroid disease Sister        Surgery   Heart disease Sister    Diabetes Sister     Gout Sister    Diabetes Sister    Ulcers Sister    Cancer Maternal Grandmother    Colon cancer Neg Hx    Esophageal cancer Neg Hx    Liver cancer Neg Hx    Pancreatic cancer Neg Hx    Rectal cancer Neg Hx     No Known Allergies  Outpatient Medications Prior to Visit  Medication Sig Dispense Refill   aspirin EC 81 MG tablet Take 81 mg by mouth daily.     Blood Glucose Monitoring Suppl (ACCU-CHEK GUIDE ME) w/Device KIT Use daily 1 kit 0   calcium carbonate (TITRALAC) 420 MG CHEW chewable tablet Chew by mouth.     Cholecalciferol (VITAMIN D-3 PO) Take 400 Units by mouth daily.      EUTHYROX 75 MCG tablet TAKE 1 TABLET BY MOUTH ONCE DAILY BEFORE BREAKFAST 90 tablet 1   fish oil-omega-3 fatty acids 1000 MG capsule Take 2 g by mouth daily.     fluticasone (FLONASE) 50 MCG/ACT nasal spray Place 2 sprays into both nostrils daily. 16 g 6   glucose blood (ACCU-CHEK GUIDE) test strip Use as instructed daily 100 each 3  HYDROcodone-acetaminophen (NORCO/VICODIN) 5-325 MG tablet Take 1 tablet by mouth every 4 (four) hours as needed. 10 tablet 0   magnesium oxide (MAG-OX) 400 MG tablet Take 400 mg by mouth daily.      meclizine (ANTIVERT) 25 MG tablet Take 1 tablet (25 mg total) by mouth 3 (three) times daily as needed for dizziness. 60 tablet 1   OVER THE COUNTER MEDICATION Chlor Tab, two tablets daily for allergies.     Vitamin D, Ergocalciferol, (DRISDOL) 1.25 MG (50000 UNIT) CAPS capsule Take 1 capsule (50,000 Units total) by mouth every 7 (seven) days. 16 capsule 0   atorvastatin (LIPITOR) 20 MG tablet Take 1 tablet by mouth once daily 30 tablet 0   cyclobenzaprine (FLEXERIL) 10 MG tablet Take 1 tablet (10 mg total) by mouth 2 (two) times daily as needed for muscle spasms. 60 tablet 1   hydrochlorothiazide (MICROZIDE) 12.5 MG capsule Take 1 capsule by mouth once daily 90 capsule 1   meloxicam (MOBIC) 15 MG tablet Take 1 tablet by mouth once daily 30 tablet 0   metFORMIN (GLUCOPHAGE) 500 MG  tablet Take 2 tablets (1,000 mg total) by mouth 2 (two) times daily with a meal. 360 tablet 1   diclofenac Sodium (VOLTAREN) 1 % GEL Apply 4 g topically 4 (four) times daily as needed. (Patient not taking: No sig reported) 500 g 6   guaiFENesin (MUCINEX) 600 MG 12 hr tablet Take 1 tablet (600 mg total) by mouth 2 (two) times daily. 6 tablet 0   metroNIDAZOLE (FLAGYL) 500 MG tablet Take 1 tablet (500 mg total) by mouth 2 (two) times daily. (Patient not taking: Reported on 01/23/2021) 14 tablet 0   metroNIDAZOLE (METROGEL VAGINAL) 0.75 % vaginal gel Place 1 Applicatorful vaginally at bedtime. 70 g 0   Facility-Administered Medications Prior to Visit  Medication Dose Route Frequency Provider Last Rate Last Admin   0.9 %  sodium chloride infusion  500 mL Intravenous Once Armbruster, Carlota Raspberry, MD         ROS Review of Systems  Constitutional:  Negative for activity change, appetite change and fatigue.  HENT:  Negative for congestion, sinus pressure and sore throat.   Eyes:  Negative for visual disturbance.  Respiratory:  Negative for cough, chest tightness, shortness of breath and wheezing.   Cardiovascular:  Negative for chest pain and palpitations.  Gastrointestinal:  Negative for abdominal distention, abdominal pain and constipation.  Endocrine: Negative for polydipsia.  Genitourinary:  Negative for dysuria and frequency.  Musculoskeletal:  Negative for arthralgias and back pain.  Skin:  Negative for rash.  Neurological:  Negative for tremors, light-headedness and numbness.  Hematological:  Does not bruise/bleed easily.  Psychiatric/Behavioral:  Negative for agitation and behavioral problems.    Objective:  BP 134/78   Pulse 60   Ht '5\' 10"'  (1.778 m)   Wt 212 lb 9.6 oz (96.4 kg)   SpO2 100%   BMI 30.50 kg/m   BP/Weight 01/23/2021 12/11/2020 6/76/7209  Systolic BP 470 962 836  Diastolic BP 78 79 79  Wt. (Lbs) 212.6 209.6 -  BMI 30.5 30.07 -      Physical Exam Constitutional:       Appearance: She is well-developed.  Cardiovascular:     Rate and Rhythm: Normal rate.     Heart sounds: Normal heart sounds. No murmur heard. Pulmonary:     Effort: Pulmonary effort is normal.     Breath sounds: Normal breath sounds. No wheezing or rales.  Chest:  Chest wall: No tenderness.  Abdominal:     General: Bowel sounds are normal. There is no distension.     Palpations: Abdomen is soft. There is no mass.     Tenderness: There is no abdominal tenderness.  Musculoskeletal:        General: Tenderness (R GLUTEAL REGION) present.     Right lower leg: Edema (1+ non pitting ankle) present.     Left lower leg: Edema (1+ non pitting ankle) present.     Comments: Positive straight leg raise on the right  Neurological:     Mental Status: She is alert and oriented to person, place, and time.  Psychiatric:        Mood and Affect: Mood normal.    CMP Latest Ref Rng & Units 10/10/2020 04/18/2020 09/30/2019  Glucose 70 - 99 mg/dL 83 125(H) 130(H)  BUN 8 - 23 mg/dL '21 16 13  ' Creatinine 0.44 - 1.00 mg/dL 0.87 0.91 0.81  Sodium 135 - 145 mmol/L 140 140 139  Potassium 3.5 - 5.1 mmol/L 3.8 3.9 4.3  Chloride 98 - 111 mmol/L 105 104 100  CO2 22 - 32 mmol/L '28 26 27  ' Calcium 8.9 - 10.3 mg/dL 9.8 9.4 9.8  Total Protein 6.0 - 8.5 g/dL - 7.0 7.4  Total Bilirubin 0.0 - 1.2 mg/dL - 0.3 0.3  Alkaline Phos 44 - 121 IU/L - 95 106  AST 0 - 40 IU/L - 13 21  ALT 0 - 32 IU/L - 19 23    Lipid Panel     Component Value Date/Time   CHOL 224 (H) 09/30/2019 1050   TRIG 207 (H) 09/30/2019 1050   HDL 50 09/30/2019 1050   CHOLHDL 4.5 (H) 09/30/2019 1050   CHOLHDL 4.8 10/08/2013 1130   VLDL 35 10/08/2013 1130   LDLCALC 137 (H) 09/30/2019 1050    CBC    Component Value Date/Time   WBC 3.8 (L) 10/10/2020 1242   RBC 4.71 10/10/2020 1242   HGB 13.0 10/10/2020 1242   HGB 12.6 02/26/2017 1113   HCT 39.6 10/10/2020 1242   HCT 38.0 02/26/2017 1113   PLT 242 10/10/2020 1242   PLT 247 02/26/2017  1113   MCV 84.1 10/10/2020 1242   MCV 83 02/26/2017 1113   MCH 27.6 10/10/2020 1242   MCHC 32.8 10/10/2020 1242   RDW 13.9 10/10/2020 1242   RDW 14.4 02/26/2017 1113   LYMPHSABS 1.5 03/05/2018 1424   LYMPHSABS 1.8 02/26/2017 1113   MONOABS 0.4 03/05/2018 1424   EOSABS 0.2 03/05/2018 1424   EOSABS 0.2 02/26/2017 1113   BASOSABS 0.0 03/05/2018 1424   BASOSABS 0.0 02/26/2017 1113    Lab Results  Component Value Date   HGBA1C 6.6 01/23/2021    Depression screen PHQ 2/9 01/23/2021 04/18/2020 01/03/2020 09/30/2019 06/17/2019  Decreased Interest 1 1 0 1 1  Down, Depressed, Hopeless '2 1 1 1 ' 0  PHQ - 2 Score '3 2 1 2 1  ' Altered sleeping '3 3 2 2 2  ' Tired, decreased energy '3 3 3 3 3  ' Change in appetite '3 3 2 3 3  ' Feeling bad or failure about yourself  2 2 0 1 1  Trouble concentrating '3 3 2 2 3  ' Moving slowly or fidgety/restless 2 1 0 0 0  Suicidal thoughts 0 0 0 0 0  PHQ-9 Score '19 17 10 13 13  ' Some recent data might be hidden    Assessment & Plan:  1. Type 2 diabetes mellitus with  other specified complication, without long-term current use of insulin ([place]) Controlled with A1c of 6.6 but this has trended up from 6.2 previously Emotional eating coupled with decreased dose of metformin contributory No regimen change today and she will work on her lifestyle Counseled on Diabetic diet, my plate method, 409 minutes of moderate intensity exercise/week Blood sugar logs with fasting goals of 80-120 mg/dl, random of less than 180 and in the event of sugars less than 60 mg/dl or greater than 400 mg/dl encouraged to notify the clinic. Advised on the need for annual eye exams, annual foot exams, Pneumonia vaccine. - POCT glycosylated hemoglobin (Hb A1C) - Microalbumin / creatinine urine ratio; Future - metFORMIN (GLUCOPHAGE) 500 MG tablet; Take 1 tablet (500 mg total) by mouth 2 (two) times daily with a meal.  Dispense: 180 tablet; Refill: 1 - CMP14+EGFR; Future - Lipid panel; Future  2. Chronic  right-sided low back pain with right-sided sciatica Uncontrolled Acute on chronic with recent acute episode starting a week ago Demonstrated stretching exercises We will add on Cymbalta - DULoxetine (CYMBALTA) 60 MG capsule; Take 1 capsule (60 mg total) by mouth daily.  Dispense: 30 capsule; Refill: 3 - meloxicam (MOBIC) 15 MG tablet; Take 1 tablet (15 mg total) by mouth daily.  Dispense: 30 tablet; Refill: 3 - cyclobenzaprine (FLEXERIL) 10 MG tablet; Take 1 tablet (10 mg total) by mouth 2 (two) times daily as needed for muscle spasms.  Dispense: 60 tablet; Refill: 1  3. Hypertension associated with diabetes ([place]) Controlled Counseled on blood pressure goal of less than 130/80, low-sodium, DASH diet, medication compliance, 150 minutes of moderate intensity exercise per week. Discussed medication compliance, adverse effects. - hydrochlorothiazide (MICROZIDE) 12.5 MG capsule; Take 1 capsule (12.5 mg total) by mouth daily.  Dispense: 90 capsule; Refill: 1  4. Dyslipidemia associated with type 2 diabetes mellitus ([place]) Uncontrolled from 09/2019 We will check lipid panel and adjust regimen accordingly - atorvastatin (LIPITOR) 20 MG tablet; Take 1 tablet (20 mg total) by mouth daily.  Dispense: 90 tablet; Refill: 1  5. Anxiety and depression Triggered by stress at work and inability to take adequate time off Will refer to LCSW for psychotherapy Discussed benefits and adverse effects of Cymbalta including onset of action and after shared decision making she is willing to try Cymbalta - DULoxetine (CYMBALTA) 60 MG capsule; Take 1 capsule (60 mg total) by mouth daily.  Dispense: 30 capsule; Refill: 3  6. Stress See #5 above  7. Need for hepatitis C screening test - HCV RNA quant rflx ultra or genotyp([facility]); Future  8. Need for immunization against influenza - Flu Vaccine QUAD 71moIM (Fluarix, Fluzone & Alfiuria Quad PF)  Health Care Maintenance: PCV 20 at next office visit Meds  ordered this encounter  Medications   DULoxetine (CYMBALTA) 60 MG capsule    Sig: Take 1 capsule (60 mg total) by mouth daily.    Dispense:  30 capsule    Refill:  3   metFORMIN (GLUCOPHAGE) 500 MG tablet    Sig: Take 1 tablet (500 mg total) by mouth 2 (two) times daily with a meal.    Dispense:  180 tablet    Refill:  1   meloxicam (MOBIC) 15 MG tablet    Sig: Take 1 tablet (15 mg total) by mouth daily.    Dispense:  30 tablet    Refill:  3   hydrochlorothiazide (MICROZIDE) 12.5 MG capsule    Sig: Take 1 capsule (12.5 mg total) by mouth daily.  Dispense:  90 capsule    Refill:  1   atorvastatin (LIPITOR) 20 MG tablet    Sig: Take 1 tablet (20 mg total) by mouth daily.    Dispense:  90 tablet    Refill:  1   cyclobenzaprine (FLEXERIL) 10 MG tablet    Sig: Take 1 tablet (10 mg total) by mouth 2 (two) times daily as needed for muscle spasms.    Dispense:  60 tablet    Refill:  1    Follow-up: Return in about 6 months (around 07/24/2021) for Medical conditions.       Charlott Rakes, MD, FAAFP. [facility] and [place], [place]   01/23/2021, 2:37 PM

## 2021-01-23 NOTE — Telephone Encounter (Signed)
Patient with elevated PHQ-9 scores, stress at work, anxiety and depression commenced on Cymbalta today.  She will benefit from psychotherapy.

## 2021-01-24 ENCOUNTER — Ambulatory Visit: Payer: 59 | Attending: Family Medicine

## 2021-01-24 DIAGNOSIS — Z1159 Encounter for screening for other viral diseases: Secondary | ICD-10-CM

## 2021-01-24 DIAGNOSIS — E1169 Type 2 diabetes mellitus with other specified complication: Secondary | ICD-10-CM

## 2021-01-25 LAB — MICROALBUMIN / CREATININE URINE RATIO
Creatinine, Urine: 107.9 mg/dL
Microalb/Creat Ratio: 9 mg/g{creat} (ref 0–29)
Microalbumin, Urine: 10.2 ug/mL

## 2021-01-26 ENCOUNTER — Other Ambulatory Visit: Payer: Self-pay | Admitting: Family Medicine

## 2021-01-26 DIAGNOSIS — E785 Hyperlipidemia, unspecified: Secondary | ICD-10-CM

## 2021-01-26 DIAGNOSIS — E1169 Type 2 diabetes mellitus with other specified complication: Secondary | ICD-10-CM

## 2021-01-26 MED ORDER — ATORVASTATIN CALCIUM 40 MG PO TABS: 40.0000 mg | ORAL_TABLET | Freq: Every day | ORAL | 1 refills | Status: DC

## 2021-01-27 LAB — HCV RNA QUANT RFLX ULTRA OR GENOTYP: HCV Quant Baseline: NOT DETECTED IU/mL

## 2021-01-27 LAB — LIPID PANEL
Chol/HDL Ratio: 3.8 ratio (ref 0.0–4.4)
Cholesterol, Total: 200 mg/dL — ABNORMAL HIGH (ref 100–199)
HDL: 53 mg/dL (ref >39)
LDL Chol Calc (NIH): 125 mg/dL — ABNORMAL HIGH (ref 0–99)
Triglycerides: 124 mg/dL (ref 0–149)
VLDL Cholesterol Cal: 22 mg/dL (ref 5–40)

## 2021-01-27 LAB — CMP14+EGFR
ALT: 17 IU/L (ref 0–32)
AST: 11 IU/L (ref 0–40)
Albumin/Globulin Ratio: 1.9 (ref 1.2–2.2)
Albumin: 4.6 g/dL (ref 3.8–4.8)
Alkaline Phosphatase: 103 IU/L (ref 44–121)
BUN/Creatinine Ratio: 21 (ref 12–28)
BUN: 19 mg/dL (ref 8–27)
Bilirubin Total: 0.4 mg/dL (ref 0.0–1.2)
CO2: 25 mmol/L (ref 20–29)
Calcium: 9.7 mg/dL (ref 8.7–10.3)
Chloride: 103 mmol/L (ref 96–106)
Creatinine, Ser: 0.91 mg/dL (ref 0.57–1.00)
Globulin, Total: 2.4 g/dL (ref 1.5–4.5)
Glucose: 109 mg/dL — ABNORMAL HIGH (ref 70–99)
Potassium: 4.2 mmol/L (ref 3.5–5.2)
Sodium: 141 mmol/L (ref 134–144)
Total Protein: 7 g/dL (ref 6.0–8.5)
eGFR: 69 mL/min/{1.73_m2} (ref >59)

## 2021-02-01 ENCOUNTER — Telehealth: Payer: Self-pay

## 2021-02-01 NOTE — Telephone Encounter (Signed)
Received fax from Pharmacy for Euthyrox they changed manufacturer and cannot get brand pt has been on. Patient has not been seen since April 2021.

## 2021-02-16 ENCOUNTER — Ambulatory Visit: Payer: Self-pay

## 2021-02-16 NOTE — Telephone Encounter (Signed)
Patient has an appt scheduled at 0810 on Tues.   Did not want appt on Monday with [facility] at [facility]. Advised to call on Monday for cancellations.

## 2021-02-16 NOTE — Telephone Encounter (Signed)
Pt. Has had fever, chills, sore throat, headache x 3 days. COVID 19 home test negative. Does not have a thermometer, "but I feel feverish." Requesting an in office visit today. Please advise pt.     Answer Assessment - Initial Assessment Questions 1. TEMPERATURE: "What is the most recent temperature?"  "How was it measured?"      Unsure 2. ONSET: "When did the fever start?"      3 days ago 3. CHILLS: "Do you have chills?" If yes: "How bad are they?"  (e.g., none, mild, moderate, severe)   - NONE: no chills   - MILD: feeling cold   - MODERATE: feeling very cold, some shivering (feels better under a thick blanket)   - SEVERE: feeling extremely cold with shaking chills (general body shaking, rigors; even under a thick blanket)      Yes 4. OTHER SYMPTOMS: "Do you have any other symptoms besides the fever?"  (e.g., abdomen pain, cough, diarrhea, earache, headache, sore throat, urination pain)     Headache, sore throat, chills, nausea 5. CAUSE: If there are no symptoms, ask: "What do you think is causing the fever?"      Unsure 6. CONTACTS: "Does anyone else in the family have an infection?"     No 7. TREATMENT: "What have you done so far to treat this fever?" (e.g., medications)     Tylenol 8. IMMUNOCOMPROMISE: "Do you have of the following: diabetes, HIV positive, splenectomy, cancer chemotherapy, chronic steroid treatment, transplant patient, etc."     No 9. PREGNANCY: "Is there any chance you are pregnant?" "When was your last menstrual period?"     No 10. TRAVEL: "Have you traveled out of the country in the last month?" (e.g., travel history, exposures)       No  Protocols used: [facility]

## 2021-02-19 ENCOUNTER — Other Ambulatory Visit: Payer: Self-pay

## 2021-02-19 ENCOUNTER — Ambulatory Visit: Payer: 59 | Attending: Family Medicine | Admitting: Clinical

## 2021-02-19 DIAGNOSIS — F4323 Adjustment disorder with mixed anxiety and depressed mood: Secondary | ICD-10-CM | POA: Diagnosis not present

## 2021-02-20 ENCOUNTER — Ambulatory Visit: Payer: 59 | Attending: Family Medicine | Admitting: Family Medicine

## 2021-02-20 ENCOUNTER — Encounter: Payer: Self-pay | Admitting: Family Medicine

## 2021-02-20 DIAGNOSIS — J0110 Acute frontal sinusitis, unspecified: Secondary | ICD-10-CM | POA: Diagnosis not present

## 2021-02-20 MED ORDER — AMOXICILLIN-POT CLAVULANATE 875-125 MG PO TABS
1.0000 | ORAL_TABLET | Freq: Two times a day (BID) | ORAL | 0 refills | Status: DC
Start: 2021-02-20 — End: 2021-10-04

## 2021-02-20 MED ORDER — ALBUTEROL SULFATE HFA 108 (90 BASE) MCG/ACT IN AERS: 2.0000 | INHALATION_SPRAY | Freq: Four times a day (QID) | RESPIRATORY_TRACT | 2 refills | Status: DC | PRN

## 2021-02-20 NOTE — Progress Notes (Signed)
Virtual Visit via Telephone Note  I connected with Stacy Bryan, on 02/20/2021 at 8:13 AM by telephone due to the COVID-19 pandemic and verified that I am speaking with the correct person using two identifiers.   Consent: I discussed the limitations, risks, security and privacy concerns of performing an evaluation and management service by telephone and the availability of in person appointments. I also discussed with the patient that there may be a patient responsible charge related to this service. The patient expressed understanding and agreed to proceed.   Location of Patient: Home  Location of Provider: Clinic   Persons participating in Telemedicine visit: Stacy Bryan Dr. Margarita Rana     History of Present Illness: Stacy Bryan is a 65 y.o. year old female with a history of Type 2 DM (A1c 6.6), Hypertension, chronic low back pain, hypothyroidism (secondary to radioactive treatment for multinodular goiter), chronic venous insufficiency, vertigo.    Complains of congestion. She took her COVID booster on 02/09/21 and symptoms started shortly after that. Fatigue, headache, congestion, productive cough, cold chills followed. She used Alka Seltzer plus, Tylenol,Theraflu, Nyquil which did help some. When she lies flat she hears rattling in her chest and has some dyspnea.  She does have a subjective fever but has no thermometer to check her temperature. She also has a sore throat but does not have facial tenderness.  Past Medical History:  Diagnosis Date   Abnormal Pap smear    Allergy    Anemia    GERD (gastroesophageal reflux disease)    Hyperlipidemia    Hypertension    Thyroid disease    Vertigo    No Known Allergies  Current Outpatient Medications on File Prior to Visit  Medication Sig Dispense Refill   aspirin EC 81 MG tablet Take 81 mg by mouth daily.     atorvastatin (LIPITOR) 40 MG tablet Take 1 tablet (40 mg total) by mouth daily. 90 tablet 1    Blood Glucose Monitoring Suppl (ACCU-CHEK GUIDE ME) w/Device KIT Use daily 1 kit 0   calcium carbonate (TITRALAC) 420 MG CHEW chewable tablet Chew by mouth.     Cholecalciferol (VITAMIN D-3 PO) Take 400 Units by mouth daily.      cyclobenzaprine (FLEXERIL) 10 MG tablet Take 1 tablet (10 mg total) by mouth 2 (two) times daily as needed for muscle spasms. 60 tablet 1   diclofenac Sodium (VOLTAREN) 1 % GEL Apply 4 g topically 4 (four) times daily as needed. (Patient not taking: No sig reported) 500 g 6   DULoxetine (CYMBALTA) 60 MG capsule Take 1 capsule (60 mg total) by mouth daily. 30 capsule 3   EUTHYROX 75 MCG tablet TAKE 1 TABLET BY MOUTH ONCE DAILY BEFORE BREAKFAST 90 tablet 1   fish oil-omega-3 fatty acids 1000 MG capsule Take 2 g by mouth daily.     fluticasone (FLONASE) 50 MCG/ACT nasal spray Place 2 sprays into both nostrils daily. 16 g 6   glucose blood (ACCU-CHEK GUIDE) test strip Use as instructed daily 100 each 3   guaiFENesin (MUCINEX) 600 MG 12 hr tablet Take 1 tablet (600 mg total) by mouth 2 (two) times daily. 6 tablet 0   hydrochlorothiazide (MICROZIDE) 12.5 MG capsule Take 1 capsule (12.5 mg total) by mouth daily. 90 capsule 1   HYDROcodone-acetaminophen (NORCO/VICODIN) 5-325 MG tablet Take 1 tablet by mouth every 4 (four) hours as needed. 10 tablet 0   magnesium oxide (MAG-OX) 400 MG tablet Take 400 mg by  mouth daily.      meclizine (ANTIVERT) 25 MG tablet Take 1 tablet (25 mg total) by mouth 3 (three) times daily as needed for dizziness. 60 tablet 1   meloxicam (MOBIC) 15 MG tablet Take 1 tablet (15 mg total) by mouth daily. 30 tablet 3   metFORMIN (GLUCOPHAGE) 500 MG tablet Take 1 tablet (500 mg total) by mouth 2 (two) times daily with a meal. 180 tablet 1   metroNIDAZOLE (FLAGYL) 500 MG tablet Take 1 tablet (500 mg total) by mouth 2 (two) times daily. (Patient not taking: Reported on 01/23/2021) 14 tablet 0   metroNIDAZOLE (METROGEL VAGINAL) 0.75 % vaginal gel Place 1  Applicatorful vaginally at bedtime. 70 g 0   OVER THE COUNTER MEDICATION Chlor Tab, two tablets daily for allergies.     Vitamin D, Ergocalciferol, (DRISDOL) 1.25 MG (50000 UNIT) CAPS capsule Take 1 capsule (50,000 Units total) by mouth every 7 (seven) days. 16 capsule 0   Current Facility-Administered Medications on File Prior to Visit  Medication Dose Route Frequency Provider Last Rate Last Admin   0.9 %  sodium chloride infusion  500 mL Intravenous Once Armbruster, Carlota Raspberry, MD        ROS: See HPI  Observations/Objective: Awake, alert, ranted x3 Not in acute distress Nasal speech Patient coughing throughout encounter  CMP Latest Ref Rng & Units 01/24/2021 10/10/2020 04/18/2020  Glucose 70 - 99 mg/dL 109(H) 83 125(H)  BUN 8 - 27 mg/dL '19 21 16  ' Creatinine 0.57 - 1.00 mg/dL 0.91 0.87 0.91  Sodium 134 - 144 mmol/L 141 140 140  Potassium 3.5 - 5.2 mmol/L 4.2 3.8 3.9  Chloride 96 - 106 mmol/L 103 105 104  CO2 20 - 29 mmol/L '25 28 26  ' Calcium 8.7 - 10.3 mg/dL 9.7 9.8 9.4  Total Protein 6.0 - 8.5 g/dL 7.0 - 7.0  Total Bilirubin 0.0 - 1.2 mg/dL 0.4 - 0.3  Alkaline Phos 44 - 121 IU/L 103 - 95  AST 0 - 40 IU/L 11 - 13  ALT 0 - 32 IU/L 17 - 19    Lipid Panel     Component Value Date/Time   CHOL 200 (H) 01/24/2021 0953   TRIG 124 01/24/2021 0953   HDL 53 01/24/2021 0953   CHOLHDL 3.8 01/24/2021 0953   CHOLHDL 4.8 10/08/2013 1130   VLDL 35 10/08/2013 1130   LDLCALC 125 (H) 01/24/2021 0953   LABVLDL 22 01/24/2021 0953    Lab Results  Component Value Date   HGBA1C 6.6 01/23/2021    Assessment and Plan: 1. Acute non-recurrent frontal sinusitis Symptoms have persisted for 9 to 10 days Given protracted course I am concerned that this could progress to a lower respiratory infection so I will place her on antibiotics - amoxicillin-clavulanate (AUGMENTIN) 875-125 MG tablet; Take 1 tablet by mouth 2 (two) times daily.  Dispense: 20 tablet; Refill: 0 - albuterol (VENTOLIN HFA) 108  (90 Base) MCG/ACT inhaler; Inhale 2 puffs into the lungs every 6 (six) hours as needed for wheezing or shortness of breath.  Dispense: 8 g; Refill: 2   Follow Up Instructions: Advised to present to urgent care if symptoms do not improve.   I discussed the assessment and treatment plan with the patient. The patient was provided an opportunity to ask questions and all were answered. The patient agreed with the plan and demonstrated an understanding of the instructions.   The patient was advised to call back or seek an in-person evaluation if the symptoms  worsen or if the condition fails to improve as anticipated.     I provided 11 minutes total of non-face-to-face time during this encounter.   Charlott Rakes, MD, FAAFP. [facility] and [place], [place]   02/20/2021, 8:13 AM

## 2021-02-20 NOTE — BH Specialist Note (Signed)
Integrated Behavioral Health Initial In-Person Visit  MRN: [id number] Name: Stacy Bryan  Number of [place] Clinician visits:: 1/6 Session Start time: 4:20pm  Session End time: 5:10pm Total time: 50  minutes  Types of Service: Individual psychotherapy  Interpretor:No. Interpretor Name and Language: N/A   Warm Hand Off Completed.        Subjective: Stacy Bryan is a 65 y.o. female accompanied by  self Patient was referred by Charlott Rakes, MD for anxiety and depression. Patient reports the following symptoms/concerns: Reports feeling anxious, excessive worrying, trouble relaxing, restlessness, irritability, feeling depressed, excessive sleeping, decreased energy, and trouble concentrating. Reports that she has been stressed at work. Reports that she has been worried about maintaining her job due to her director writing her up. Reports working for [employer] for six years. Reports that she has been overwhelmed and has experienced decreased motivation with work. Reports a desire to retire but worries about finances.  Duration of problem: 3 months; Severity of problem: moderate  Objective: Mood: Anxious and Affect: Appropriate Risk of harm to self or others: No plan to harm self or others  Life Context: Family and Social: Pt has adult children who live in [place]. Pt spends time with friends. Pt has limited social support as pt is from [place]. School/Work: Pt is employed full time.  Self-Care: No substance use. Pt is engaged in medication management with PCP. Pt has recently stopped medication due to being sick after receiving COVID shot but plans to restart them. Life Changes: Pt has been experiencing stress at work.   Patient and/or Family's Strengths/Protective Factors: Social and Emotional competence  Goals Addressed: Patient will: Reduce symptoms of: anxiety and depression Increase knowledge and/or ability of: coping skills and stress reduction   Demonstrate ability to: Increase healthy adjustment to current life circumstances  Progress towards Goals: Ongoing  Interventions: Interventions utilized: Mindfulness or Psychologist, educational, CBT Cognitive Behavioral Therapy, Supportive Counseling, and Psychoeducation and/or Health Education  Standardized Assessments completed: GAD-7 and PHQ 9 GAD 7 : Generalized Anxiety Score 02/19/2021 01/23/2021 04/18/2020 01/03/2020  Nervous, Anxious, on Edge 3 3 1  0  Control/stop worrying 3 3 2 1   Worry too much - different things 3 3 3 1   Trouble relaxing 3 3 3 2   Restless 3 2 3 1   Easily annoyed or irritable 3 3 3 1   Afraid - awful might happen 2 2 1  0  Total GAD 7 Score 20 19 16 6      Depression screen [facility] 2/9 02/19/2021 01/23/2021 04/18/2020 01/03/2020 09/30/2019  Decreased Interest 2 1 1  0 1  Down, Depressed, Hopeless 3 2 1 1 1   PHQ - 2 Score 5 3 2 1 2   Altered sleeping 3 3 3 2 2   Tired, decreased energy 3 3 3 3 3   Change in appetite 3 3 3 2 3   Feeling bad or failure about yourself  2 2 2  0 1  Trouble concentrating 3 3 3 2 2   Moving slowly or fidgety/restless 2 2 1  0 0  Suicidal thoughts 0 0 0 0 0  PHQ-9 Score 21 19 17 10 13   Some recent data might be hidden    Patient and/or Family Response: Pt receptive to tx. Pt receptive to psychoeducation provided on depression and anxiety. Pt receptive to cognitive restructuring in order to assist with cognitive processing skills. Pt will increase daily self-care and incorporate self-care days/time-off from work periodically. Pt will utilize deep breathing exercises and begin establishing healthy boundaries.  Patient Centered Plan: Patient is on the following Treatment Plan(s):  Stress  Assessment: Denies SI/HI. Denies auditory/visual hallucinations. No safety risks. Patient currently experiencing an adjustment reaction with depression and anxiety. Pt experiences stress at work and feeling overwhelmed. Pt has taken on multiple tasks at work.   Patient  may benefit from engaging in brief interventions. LCSWA provided psychoeducation on depression and anxiety. LCSWA utilized cognitive restructuring to assist with cognitive processing skills. LCSWA encouraged pt to incorporate daily self-care, incorporate self-care days, utilize deep breathing exercises, and establishing healthy boundaries. LCSWA will fu with pt.  Plan: Follow up with behavioral health clinician on : 03/14/21 Behavioral recommendations: Incorporate daily self-care, utilize deep breathing exercises, and establish healthy boundaries.  Referral(s): [place] (In Clinic) "From scale of 1-10, how likely are you to follow plan?": 10  Emani Taussig C Maryjo Ragon, LCSW

## 2021-03-12 ENCOUNTER — Other Ambulatory Visit: Payer: Self-pay

## 2021-03-12 ENCOUNTER — Ambulatory Visit: Payer: 59 | Admitting: Interventional Cardiology

## 2021-03-12 ENCOUNTER — Encounter: Payer: Self-pay | Admitting: Interventional Cardiology

## 2021-03-12 VITALS — BP 138/78 | HR 62 | Ht 70.0 in | Wt 212.2 lb

## 2021-03-12 DIAGNOSIS — E118 Type 2 diabetes mellitus with unspecified complications: Secondary | ICD-10-CM | POA: Diagnosis not present

## 2021-03-12 DIAGNOSIS — Z794 Long term (current) use of insulin: Secondary | ICD-10-CM

## 2021-03-12 DIAGNOSIS — R072 Precordial pain: Secondary | ICD-10-CM | POA: Diagnosis not present

## 2021-03-12 DIAGNOSIS — E785 Hyperlipidemia, unspecified: Secondary | ICD-10-CM

## 2021-03-12 DIAGNOSIS — Z8249 Family history of ischemic heart disease and other diseases of the circulatory system: Secondary | ICD-10-CM

## 2021-03-12 DIAGNOSIS — I10 Essential (primary) hypertension: Secondary | ICD-10-CM | POA: Diagnosis not present

## 2021-03-12 LAB — BASIC METABOLIC PANEL
BUN/Creatinine Ratio: 18 (ref 12–28)
BUN: 16 mg/dL (ref 8–27)
CO2: 24 mmol/L (ref 20–29)
Calcium: 9.4 mg/dL (ref 8.7–10.3)
Chloride: 105 mmol/L (ref 96–106)
Creatinine, Ser: 0.91 mg/dL (ref 0.57–1.00)
Glucose: 112 mg/dL — ABNORMAL HIGH (ref 70–99)
Potassium: 4 mmol/L (ref 3.5–5.2)
Sodium: 142 mmol/L (ref 134–144)
eGFR: 69 mL/min/{1.73_m2} (ref >59)

## 2021-03-12 MED ORDER — METOPROLOL TARTRATE 50 MG PO TABS: ORAL_TABLET | ORAL | 0 refills | Status: DC

## 2021-03-12 MED ORDER — EZETIMIBE 10 MG PO TABS: 10.0000 mg | ORAL_TABLET | Freq: Every day | ORAL | 3 refills | Status: DC

## 2021-03-12 NOTE — Patient Instructions (Addendum)
Medication Instructions:  1) START Zetia 10mg  once daily  *If you need a refill on your cardiac medications before your next appointment, please call your pharmacy*   Lab Work: BMET today  Lipid and Liver in 6 weeks.  You will need to be fasting for these labs (nothing to eat or drink after midnight except water and black coffee).  If you have labs (blood work) drawn today and your tests are completely normal, you will receive your results only by: [place] (if you have MyChart) OR A paper copy in the mail If you have any lab test that is abnormal or we need to change your treatment, we will call you to review the results.   Testing/Procedures: Your physician recommends that you have a Coronary CT performed.   Follow-Up:  Your physician recommends that you schedule a follow-up appointment in: with [facility] shortly after your fasting labs are done.  At [facility], you and your health needs are our priority.  As part of our continuing mission to provide you with exceptional heart care, we have created designated Provider Care Teams.  These Care Teams include your primary Cardiologist (physician) and Advanced Practice Providers (APPs -  Physician Assistants and Nurse Practitioners) who all work together to provide you with the care you need, when you need it.  We recommend signing up for the patient portal called "MyChart".  Sign up information is provided on this After Visit Summary.  MyChart is used to connect with patients for Virtual Visits (Telemedicine).  Patients are able to view lab/test results, encounter notes, upcoming appointments, etc.  Non-urgent messages can be sent to your provider as well.   To learn more about what you can do with MyChart, go to [email or web address].    Your next appointment:   9-12 month(s)  The format for your next appointment:   In Person  Provider:   Daneen Schick, III, MD    Other Instructions    Your cardiac CT will be  scheduled at one of the below locations:   [facility] [address] [place], [place] [address] [phone]  [place] [address] [place], [place] [address] [phone]  If scheduled at [facility], please arrive at the [facility] main entrance (entrance A) of [facility] 30 minutes prior to test start time. You can use the FREE valet parking offered at the main entrance (encouraged to control the heart rate for the test) Proceed to the [facility] Radiology Department (first floor) to check-in and test prep.  If scheduled at [facility], please arrive 15 mins early for check-in and test prep.  Please follow these instructions carefully (unless otherwise directed):  Hold all erectile dysfunction medications at least 3 days (72 hrs) prior to test.  On the Night Before the Test: Be sure to Drink plenty of water. Do not consume any caffeinated/decaffeinated beverages or chocolate 12 hours prior to your test. Do not take any antihistamines 12 hours prior to your test.   On the Day of the Test: Drink plenty of water until 1 hour prior to the test. Do not eat any food 4 hours prior to the test. You may take your regular medications prior to the test.  Take metoprolol (Lopressor) two hours prior to test. HOLD Furosemide/Hydrochlorothiazide morning of the test. FEMALES- please wear underwire-free bra if available, avoid dresses & tight clothing  After the Test: Drink plenty of water. After receiving IV contrast, you may experience a mild flushed feeling. This is normal. On occasion, you may experience a mild rash up to 24 hours after the test. This is not dangerous. If this occurs, you can take Benadryl 25 mg and increase your fluid intake. If you experience trouble breathing, this can be serious. If it is severe call 911 IMMEDIATELY. If it is mild, please call our  office. If you take any of these medications: Glipizide/Metformin, Avandament, Glucavance, please do not take 48 hours after completing test unless otherwise instructed.  Please allow 2-4 weeks for scheduling of routine cardiac CTs. Some insurance companies require a pre-authorization which may delay scheduling of this test.   For non-scheduling related questions, please contact the cardiac imaging nurse navigator should you have any questions/concerns: Marchia Bond, Cardiac Imaging Nurse Navigator Gordy Clement, Cardiac Imaging Nurse Navigator [facility] Direct Office Dial: [phone]   For scheduling needs, including cancellations and rescheduling, please call [place], [phone].

## 2021-03-12 NOTE — Progress Notes (Signed)
Cardiology Office Note:    Date:  03/12/2021   ID:  Stacy Bryan, DOB 11/01/1955, MRN [id number]  PCP:  Charlott Rakes, MD  Cardiologist:  None   Referring MD: Fenton Foy, NP   Chief Complaint  Patient presents with   Coronary Artery Disease   Hypertension   Follow-up    DM II Fam h/o CABG    History of Present Illness:    Stacy Bryan is a 64 y.o. female with a hx of palpitations and chest pain for cardiac evaluation.  Referred by nurse practitioner Lazaro Arms.  In June there was an emergency room visit because of chest discomfort and left leg discomfort August 2022 evaluation was performed including.  The chest discomfort was described as right lower chest wall pain associated with headache.  It was not exertional related.  Discomfort would last seconds and resolve.  It was more of a shooting/sticking type discomfort.  It has not recurred.  It has not been as much of an issue lately.  During today's exam she mentions that she was seen for left leg discomfort after a plane flight in August.  She gave the history that she was also having chest pain at the same time that was under the left breast.  This pain was similar to what she was having in June.  The discomfort has also resolved.  She is physically and active.  She has a prior smoker.  2 sisters her brother and her father have had coronary bypass surgery.  The older sister had bypass surgery in her late 68s.  The patient is diabetic and has high blood pressure.  She is currently on therapy for hyperlipidemia with atorvastatin 40 mg daily yielding an LDL of 125 mg/dL in October 2022.  Past Medical History:  Diagnosis Date   Abnormal Pap smear    Allergy    Anemia    GERD (gastroesophageal reflux disease)    Hyperlipidemia    Hypertension    Thyroid disease    Vertigo     Past Surgical History:  Procedure Laterality Date   ABDOMINAL HYSTERECTOMY  1997   total   FOOT SURGERY     both feet  20  yrs ago   OVARY SURGERY  1980's   removal of R ovary (cyst)    Current Medications: Current Meds  Medication Sig   albuterol (VENTOLIN HFA) 108 (90 Base) MCG/ACT inhaler Inhale 2 puffs into the lungs every 6 (six) hours as needed for wheezing or shortness of breath.   amoxicillin-clavulanate (AUGMENTIN) 875-125 MG tablet Take 1 tablet by mouth 2 (two) times daily.   aspirin EC 81 MG tablet Take 81 mg by mouth daily.   atorvastatin (LIPITOR) 40 MG tablet Take 1 tablet (40 mg total) by mouth daily.   Blood Glucose Monitoring Suppl (ACCU-CHEK GUIDE ME) w/Device KIT Use daily   calcium carbonate (TITRALAC) 420 MG CHEW chewable tablet Chew by mouth.   Cholecalciferol (VITAMIN D-3 PO) Take 400 Units by mouth daily.    cyclobenzaprine (FLEXERIL) 10 MG tablet Take 1 tablet (10 mg total) by mouth 2 (two) times daily as needed for muscle spasms.   diclofenac Sodium (VOLTAREN) 1 % GEL Apply 4 g topically 4 (four) times daily as needed.   DULoxetine (CYMBALTA) 60 MG capsule Take 1 capsule (60 mg total) by mouth daily.   EUTHYROX 75 MCG tablet TAKE 1 TABLET BY MOUTH ONCE DAILY BEFORE BREAKFAST   ezetimibe (ZETIA) 10 MG tablet Take  1 tablet (10 mg total) by mouth daily.   fish oil-omega-3 fatty acids 1000 MG capsule Take 2 g by mouth daily.   glucose blood (ACCU-CHEK GUIDE) test strip Use as instructed daily   hydrochlorothiazide (MICROZIDE) 12.5 MG capsule Take 1 capsule (12.5 mg total) by mouth daily.   magnesium oxide (MAG-OX) 400 MG tablet Take 400 mg by mouth daily.    meclizine (ANTIVERT) 25 MG tablet Take 1 tablet (25 mg total) by mouth 3 (three) times daily as needed for dizziness.   meloxicam (MOBIC) 15 MG tablet Take 1 tablet (15 mg total) by mouth daily.   metFORMIN (GLUCOPHAGE) 500 MG tablet Take 1 tablet (500 mg total) by mouth 2 (two) times daily with a meal.   metoprolol tartrate (LOPRESSOR) 50 MG tablet Take one tablet by mouth 2 hours prior to your CT   metroNIDAZOLE (FLAGYL) 500 MG  tablet Take 1 tablet (500 mg total) by mouth 2 (two) times daily.   OVER THE COUNTER MEDICATION Chlor Tab, two tablets daily for allergies.   Vitamin D, Ergocalciferol, (DRISDOL) 1.25 MG (50000 UNIT) CAPS capsule Take 1 capsule (50,000 Units total) by mouth every 7 (seven) days.   Current Facility-Administered Medications for the 03/12/21 encounter (Office Visit) with Belva Crome, MD  Medication   0.9 %  sodium chloride infusion     Allergies:   Patient has no known allergies.   Social History   Socioeconomic History   Marital status: Single    Spouse name: Not on file   Number of children: Not on file   Years of education: Not on file   Highest education level: Not on file  Occupational History   Not on file  Tobacco Use   Smoking status: Former    Types: Cigarettes    Quit date: 06/02/2006    Years since quitting: 14.7   Smokeless tobacco: Never  Vaping Use   Vaping Use: Never used  Substance and Sexual Activity   Alcohol use: Yes    Comment: occassionally   Drug use: No   Sexual activity: Not on file  Other Topics Concern   Not on file  Social History Narrative   Not on file   Social Determinants of Health   Financial Resource Strain: Not on file  Food Insecurity: Not on file  Transportation Needs: Not on file  Physical Activity: Not on file  Stress: Not on file  Social Connections: Not on file     Family History: The patient's family history includes Asthma in her father and sister; Breast cancer in her sister; Cancer in her maternal grandmother; Diabetes in her sister and sister; Gout in her sister; Heart disease in her father and sister; Hyperlipidemia in her father; Hypertension in her mother; Stomach cancer in her sister; Thyroid disease in her sister; Ulcers in her sister. There is no history of Colon cancer, Esophageal cancer, Liver cancer, Pancreatic cancer, or Rectal cancer.  ROS:   Please see the history of present illness.    Concerned about her  health.  Also concerned about medical bills and somewhat reluctant to assume too much.  All other systems reviewed and are negative.  EKGs/Labs/Other Studies Reviewed:    The following studies were reviewed today: Venous Doppler study done in August 2022 was negative for DVT  EKG:  EKG normal sinus rhythm, P wave abnormality.  Overall, normal in appearance and when compared to June 2022, EKG is improved and heart rate is faster.  Recent Labs:  04/18/2020: TSH 3.270 10/10/2020: Hemoglobin 13.0; Platelets 242 01/24/2021: ALT 17; BUN 19; Creatinine, Ser 0.91; Potassium 4.2; Sodium 141  Recent Lipid Panel    Component Value Date/Time   CHOL 200 (H) 01/24/2021 0953   TRIG 124 01/24/2021 0953   HDL 53 01/24/2021 0953   CHOLHDL 3.8 01/24/2021 0953   CHOLHDL 4.8 10/08/2013 1130   VLDL 35 10/08/2013 1130   LDLCALC 125 (H) 01/24/2021 0953    Physical Exam:    VS:  BP 138/78   Pulse 62   Ht '5\' 10"'  (1.778 m)   Wt 212 lb 3.2 oz (96.3 kg)   SpO2 95%   BMI 30.45 kg/m     Wt Readings from Last 3 Encounters:  03/12/21 212 lb 3.2 oz (96.3 kg)  01/23/21 212 lb 9.6 oz (96.4 kg)  12/11/20 209 lb 9.6 oz (95.1 kg)     GEN: Moderate obesity.  No acute distress HEENT: Normal NECK: No JVD. LYMPHATICS: No lymphadenopathy CARDIAC: No murmur. RRR no gallop, or edema. VASCULAR:  Normal Pulses. No bruits. RESPIRATORY:  Clear to auscultation without rales, wheezing or rhonchi  ABDOMEN: Soft, non-tender, non-distended, No pulsatile mass, MUSCULOSKELETAL: No deformity  SKIN: Warm and dry NEUROLOGIC:  Alert and oriented x 3 PSYCHIATRIC:  Normal affect   ASSESSMENT:    1. Precordial pain   2. Dyslipidemia   3. Type 2 diabetes mellitus with complication, with long-term current use of insulin ([place])   4. Primary hypertension   5. Family history of early CAD    PLAN:    In order of problems listed above:  Chest discomfort described affecting both the right and left chest is likely noncardiac.   However she has significant risk factors as outlined above.  I have recommended coronary CTA with FFR if indicated to define burden of coronary disease and help guide therapy. Cannot tolerate 80 mg of atorvastatin.  We therefore are adding Zetia 10 mg daily to atorvastatin 40 mg/day.  Lipid panel in 6 to 8 weeks.  Lipid clinic thereafter if LDL is not at target of less than 70, which it will probably not be.  The strong family history of coronary artery disease is likely related to a significant underlying lipid abnormality. Would be a candidate for SGLT2 therapy at some point in the future. Blood pressure is adequately controlled on the current regimen. Noted  Overall education and awareness concerning primary risk prevention was discussed in detail: LDL less than 70, hemoglobin A1c less than 7, blood pressure target less than 130/80 mmHg, >150 minutes of moderate aerobic activity per week, avoidance of smoking, weight control (via diet and exercise), and continued surveillance/management of/for obstructive sleep apnea.    Medication Adjustments/Labs and Tests Ordered: Current medicines are reviewed at length with the patient today.  Concerns regarding medicines are outlined above.  Orders Placed This Encounter  Procedures   CT CORONARY MORPH W/CTA COR W/SCORE W/CA W/CM &/OR WO/CM   Lipid panel   Hepatic function panel   Basic metabolic panel   AMB Referral to Heartcare Pharm-D   EKG 12-Lead   Meds ordered this encounter  Medications   ezetimibe (ZETIA) 10 MG tablet    Sig: Take 1 tablet (10 mg total) by mouth daily.    Dispense:  90 tablet    Refill:  3   metoprolol tartrate (LOPRESSOR) 50 MG tablet    Sig: Take one tablet by mouth 2 hours prior to your CT    Dispense:  1 tablet  Refill:  0    Patient Instructions  Medication Instructions:  1) START Zetia 30m once daily  *If you need a refill on your cardiac medications before your next appointment, please call your  pharmacy*   Lab Work: BMET today  Lipid and Liver in 6 weeks.  You will need to be fasting for these labs (nothing to eat or drink after midnight except water and black coffee).  If you have labs (blood work) drawn today and your tests are completely normal, you will receive your results only by: [place](if you have MyChart) OR A paper copy in the mail If you have any lab test that is abnormal or we need to change your treatment, we will call you to review the results.   Testing/Procedures: Your physician recommends that you have a Coronary CT performed.   Follow-Up:  Your physician recommends that you schedule a [facility] after your fasting labs are done.  [facility] and your [facility] needs are our [facility].  As part of our continuing mission to provide you with exceptional heart care, we have created designated Provider Care Teams.  These Care Teams include your primary Cardiologist (physician) and Advanced Practice Providers (APPs -  Physician Assistants and Nurse Practitioners) who all work together to provide you with the care you need, when you need it.  We recommend signing up for the patient portal called "MyChart".  Sign up information is provided on this After Visit Summary.  MyChart is used to connect with patients for Virtual Visits (Telemedicine).  Patients are able to view lab/test results, encounter notes, upcoming appointments, etc.  Non-urgent messages can be sent to your provider as well.   To learn more about what you can do with MyChart, go to [email or web address]    Your next appointment:   9-12 month(s)  The format for your next appointment:   In Person  Provider:   HDaneen Schick III, MD    Other Instructions    Your cardiac CT will be scheduled at one of the below locations:   [facility] [address]([phone] [place] [phone] [facility] please [facility] at the [facility] (entrance A[facility] minutes prior to test start time. You can use the FREE valet parking offered at the main entrance (encouraged to control the heart rate for the test) [facility] (first floor) to check-in and test prep.  [facility] please arrive 15 mins early for check-in and test prep.  Please follow these instructions carefully (unless otherwise directed):  Hold all erectile dysfunction medications at least 3 days (72 hrs) prior to test.  On the Night Before the Test: Be sure to Drink plenty of water. Do not consume any caffeinated/decaffeinated beverages or chocolate 12 hours prior to your test. Do not take any antihistamines 12 hours prior to your test.   On the Day of the Test: Drink plenty of water until 1 hour prior to the test. Do not eat any food 4 hours prior to the test. You may take your regular medications prior to the test.  Take metoprolol (Lopressor) two hours prior to test. HOLD Furosemide/Hydrochlorothiazide morning of the test. FEMALES- please wear underwire-free bra if available, avoid  dresses & tight clothing       After the Test: Drink plenty of water. After receiving IV contrast, you may experience a mild flushed feeling. This is normal. On occasion, you may experience a mild rash up to 24 hours after the test. This is not dangerous. If this occurs, you can take Benadryl 25 mg and increase your fluid intake. If you experience trouble breathing, this can be serious. If it is severe call 911 IMMEDIATELY. If it is mild, please call our office. If you take any of these medications: Glipizide/Metformin, Avandament, Glucavance, please do not take 48 hours after completing test unless  otherwise instructed.  Please allow 2-4 weeks for scheduling of routine cardiac CTs. Some insurance companies require a pre-authorization which may delay scheduling of this test.   For non-scheduling related questions, please contact the cardiac imaging nurse navigator should you have any questions/concerns: Marchia Bond, Cardiac Imaging Nurse Navigator Gordy Clement, Cardiac Imaging Nurse Navigator [facility] Direct Office Dial: [phone]   For scheduling needs, including cancellations and rescheduling, please call [place], [phone].     Signed, Sinclair Grooms, MD  03/12/2021 12:07 PM    [place]

## 2021-03-14 ENCOUNTER — Ambulatory Visit: Payer: 59 | Admitting: Clinical

## 2021-03-28 ENCOUNTER — Other Ambulatory Visit: Payer: Self-pay

## 2021-03-28 ENCOUNTER — Ambulatory Visit: Payer: 59 | Attending: Family Medicine | Admitting: Clinical

## 2021-03-28 DIAGNOSIS — F4323 Adjustment disorder with mixed anxiety and depressed mood: Secondary | ICD-10-CM

## 2021-04-04 IMAGING — MG DIGITAL SCREENING BILAT W/ CAD
4 series · 4 of 4 positions shown · non-contrast
Comparison: Previous exam(s).

ACR Breast Density Category a: The breast tissue is almost entirely
fatty.

CLINICAL DATA: Screening.

EXAM:
DIGITAL SCREENING BILATERAL MAMMOGRAM WITH CAD

[R CC]
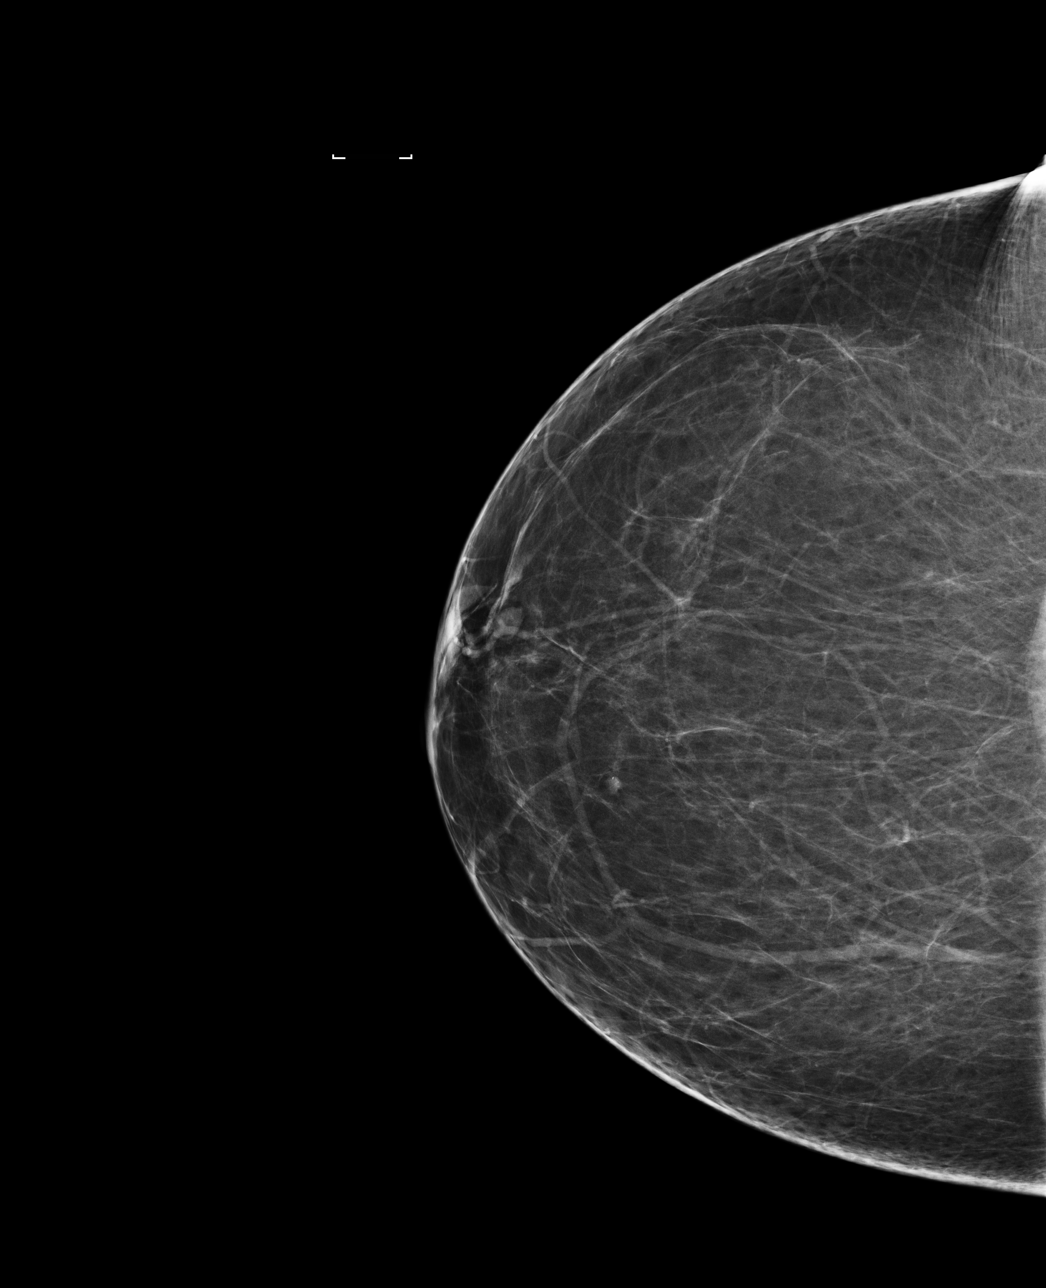

[R MLO]
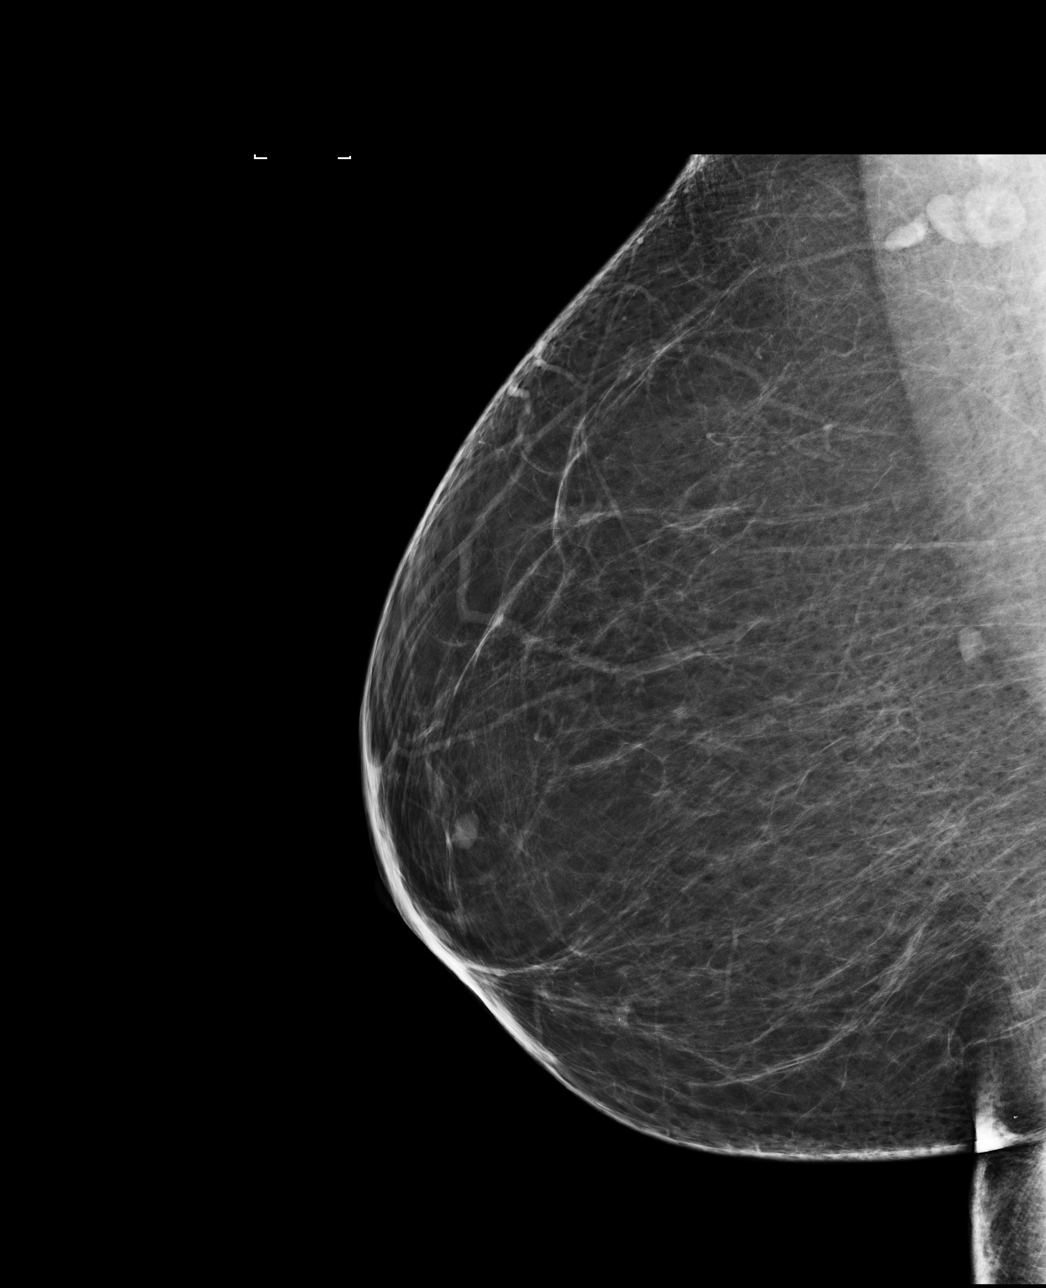

[L CC]
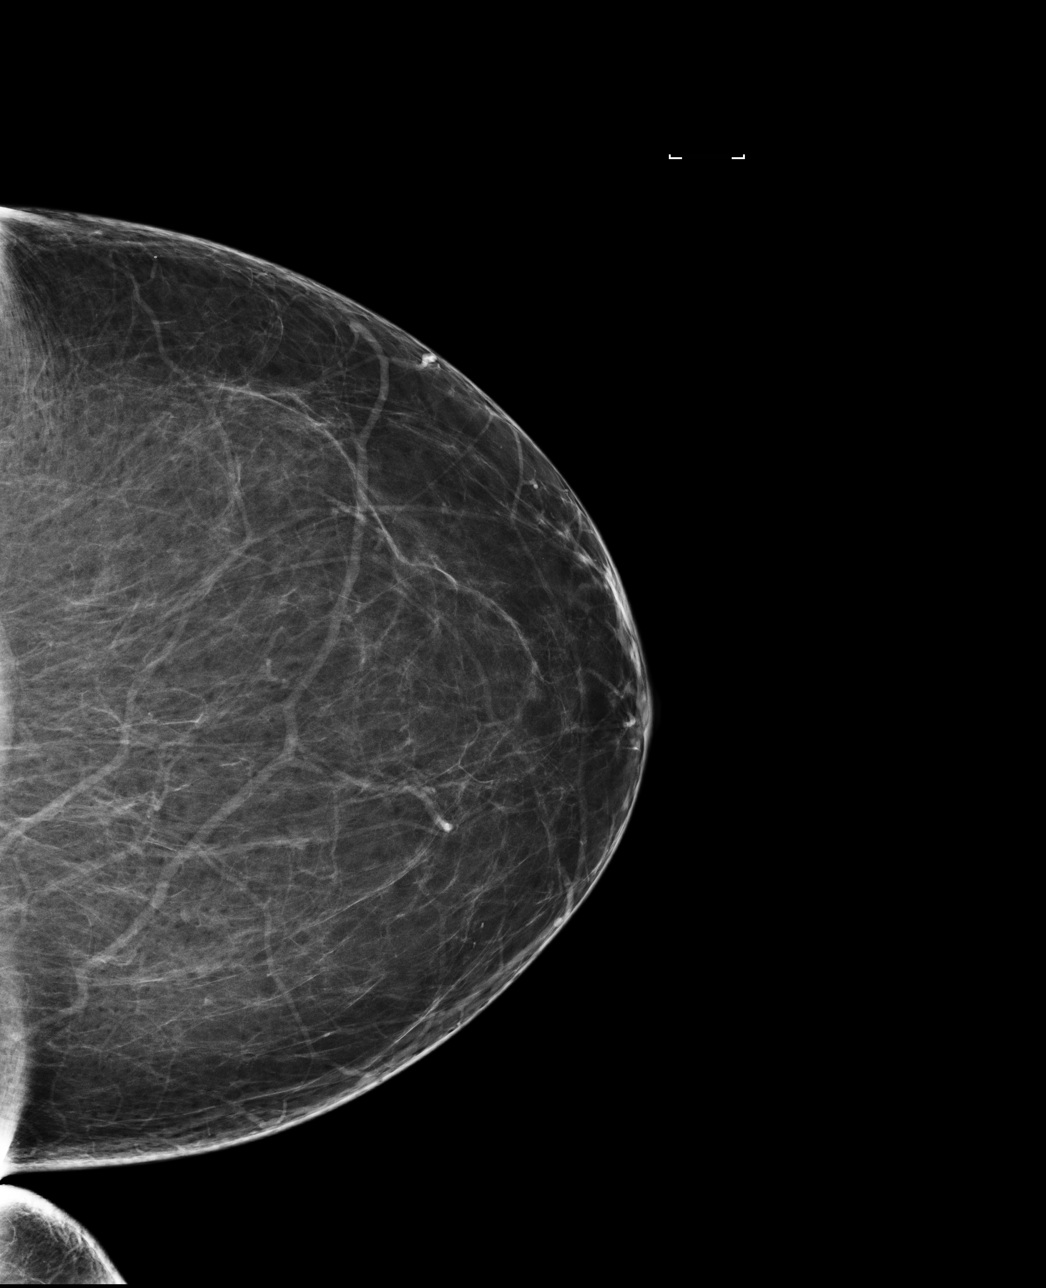

[L MLO]
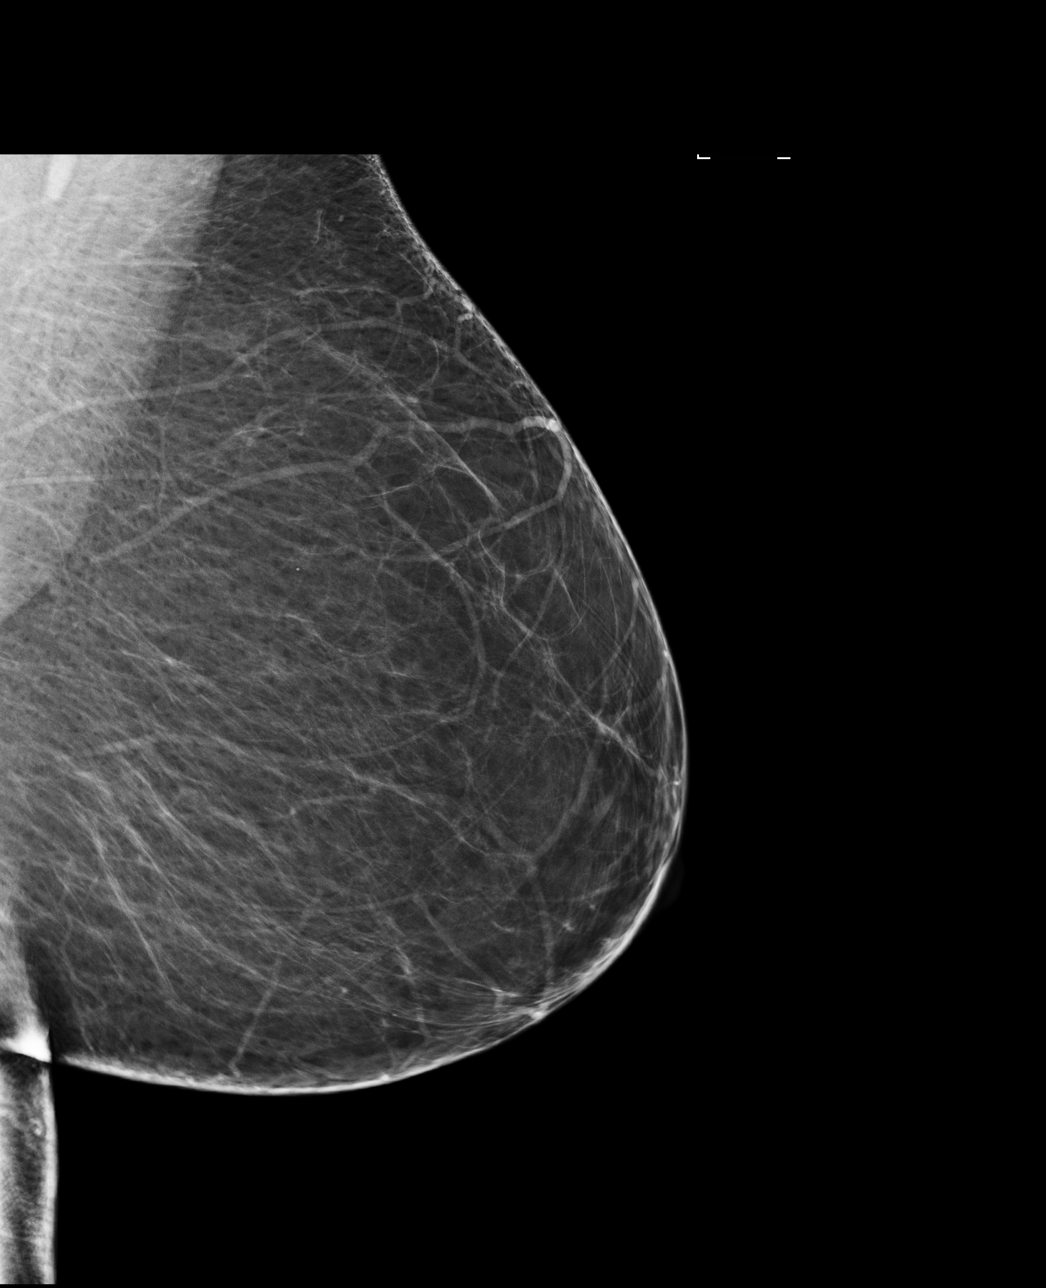

[4 of 4 positions shown; findings below may reference images not displayed]

FINDINGS: There are no findings suspicious for malignancy. Images were
processed with CAD.
IMPRESSION: No mammographic evidence of malignancy. A result letter of this
screening mammogram will be mailed directly to the patient.

RECOMMENDATION:
Screening mammogram in one year. (Code:[id number])

BI-RADS CATEGORY  1: Negative.

## 2021-04-06 NOTE — BH Specialist Note (Signed)
Integrated Behavioral Health Follow Up In-Person Visit  MRN: [id number] Name: Stacy Bryan  Number of [place] Clinician visits: 2/6 Session Start time: 4:00pm  Session End time: 4:40pm Total time: 40  minutes  Types of Service: Individual psychotherapy  Interpretor:No. Interpretor Name and Language: N/A  Subjective: Stacy Bryan is a 65 y.o. female accompanied by  self Patient was referred by Stacy Rakes, MD for anxiety and depression . Patient reports the following symptoms/concerns: Reports feeling anxious, excessive worrying, feeling overwhelmed, and experiencing decreased energy. Reports that she continues to feel overwhelmed at work.  Duration of problem: 4 months; Severity of problem: moderate  Objective: Mood: Anxious and Affect: Appropriate Risk of harm to self or others: No plan to harm self or others  Life Context: Family and Social: Pt has adult children who live in [place]. Pt spends time with friends. Pt has limited social support as pt is from [place]. School/Work: Pt is employed full time.  Self-Care: No substance use. Pt is engaged in medication management with PCP. Pt has recently stopped medication due to being sick after receiving COVID shot but plans to restart them. Life Changes: Pt has been experiencing stress at work.   Patient and/or Family's Strengths/Protective Factors: Social and Emotional competence  Goals Addressed: Patient will:  Reduce symptoms of: anxiety and depression   Increase knowledge and/or ability of: coping skills and stress reduction   Demonstrate ability to: Increase healthy adjustment to current life circumstances  Progress towards Goals: Ongoing  Interventions: Interventions utilized:  Mindfulness or Psychologist, educational, CBT Cognitive Behavioral Therapy, and Supportive Counseling Standardized Assessments completed: Not Needed  Patient and/or Family Response:  Pt receptive to tx. Pt  receptive to psychoeducation provided on anxiety. Pt receptive to cognitive restructuring. Pt will continue increasing daily self-care and deep breathing exercises.  Patient Centered Plan: Patient is on the following Treatment Plan(s): Stress  Assessment: Denies SI/HI. Denies auditory/visual hallucinations. Patient currently experiencing stress with her current job. Pt continues to be overwhelmed with her current job. Pt has difficulty with incorporating daily self-care and setting boundaries at work.   Patient may benefit from increasing daily self-care. LCSWA provided psychoeducation on stress and anxiety. LCSWA utilized Adult nurse. LCSWA encouraged pt to incorporate daily self-care and utilize deep breathing exercises. .  Plan: Follow up with behavioral health clinician on : 04/25/21 Behavioral recommendations: Incorporate daily self-care and deep breathing exercises Referral(s): [place] (In Clinic) "From scale of 1-10, how likely are you to follow plan?": 10  Rorey Bisson C Hilery Wintle, LCSW

## 2021-04-12 ENCOUNTER — Other Ambulatory Visit: Payer: Self-pay | Admitting: *Deleted

## 2021-04-12 DIAGNOSIS — R072 Precordial pain: Secondary | ICD-10-CM

## 2021-04-23 ENCOUNTER — Other Ambulatory Visit: Payer: Self-pay | Admitting: Family Medicine

## 2021-04-23 ENCOUNTER — Other Ambulatory Visit: Payer: 59

## 2021-04-23 DIAGNOSIS — Z1231 Encounter for screening mammogram for malignant neoplasm of breast: Secondary | ICD-10-CM

## 2021-04-24 ENCOUNTER — Telehealth: Payer: Self-pay | Admitting: *Deleted

## 2021-04-24 NOTE — Telephone Encounter (Signed)
Copied from [place] [id number]. Topic: General - Other >> Apr 24, 2021 10:32 AM Leward Quan A wrote: Reason for CRM: Patient called in to inform Ms [employer] that she is not feeling well so need a call back to reschedule her appointment can be reached at Ph# [phone]

## 2021-04-25 ENCOUNTER — Ambulatory Visit: Payer: 59 | Admitting: Clinical

## 2021-04-30 ENCOUNTER — Ambulatory Visit: Payer: 59

## 2021-05-02 ENCOUNTER — Other Ambulatory Visit: Payer: Self-pay

## 2021-05-02 ENCOUNTER — Encounter (HOSPITAL_COMMUNITY): Payer: Self-pay | Admitting: Emergency Medicine

## 2021-05-02 DIAGNOSIS — E890 Postprocedural hypothyroidism: Secondary | ICD-10-CM

## 2021-05-02 MED ORDER — LEVOTHYROXINE SODIUM 75 MCG PO TABS: 75.0000 ug | ORAL_TABLET | Freq: Every day | ORAL | 1 refills | Status: DC

## 2021-05-16 ENCOUNTER — Encounter: Payer: Self-pay | Admitting: *Deleted

## 2021-05-23 ENCOUNTER — Ambulatory Visit
Admission: RE | Admit: 2021-05-23 | Discharge: 2021-05-23 | Disposition: A | Discharge status: Home / Self Care | Payer: 59 | Source: Ambulatory Visit | Attending: Family Medicine | Admitting: Family Medicine

## 2021-05-23 ENCOUNTER — Other Ambulatory Visit: Payer: Self-pay

## 2021-05-23 DIAGNOSIS — Z1231 Encounter for screening mammogram for malignant neoplasm of breast: Secondary | ICD-10-CM

## 2021-06-04 NOTE — Telephone Encounter (Signed)
Final attempt to reach pt to reschedule this appt. No answer, left vm. Will send mychart message.

## 2021-06-19 ENCOUNTER — Ambulatory Visit: Payer: Self-pay | Admitting: *Deleted

## 2021-06-19 NOTE — Telephone Encounter (Signed)
?  Chief Complaint: chest tightness, shortness of breath, sweating, palpitations, heart pounding, lightheaded ?Symptoms: occurring after coming up a flight of stairs just now   Sitting in office now with a fan on her. ?Frequency: Been feeling bad for last 2 weeks. ?Pertinent Negatives: Patient denies heart problems. ?Disposition: '[x]'$ ED /'[]'$ Urgent Care (no appt availability in office) / '[]'$ Appointment(In office/virtual)/ '[]'$  [place] [facility]/ '[]'$ Home Care/ '[]'$ Refused Recommended Disposition /'[]'$  Mobile Bus/ '[]'$  Follow-up with PCP ?Additional Notes: Going to [facility].  I instructed her to call 911 if she could not get someone to take her to the ED.   Not to drive since she is feeling lightheaded.   She was agreeable.  ?

## 2021-06-19 NOTE — Telephone Encounter (Signed)
Reason for Disposition ? [1] Chest pain lasts > 5 minutes AND [2] described as crushing, pressure-like, or heavy ? ?Answer Assessment - Initial Assessment Questions ?1. RESPIRATORY STATUS: "Describe your breathing?" (e.g., wheezing, shortness of breath, unable to speak, severe coughing)  ?    Not feeling good for a couple of weeks.   I get heart palpitations, headache, sweating and shortness of breath.    ?I walked up some stairs today and my heart is pounding and hurting.   I'm sitting in my office now.   Yesterday I was light headed, sweaty, and dizzy.   I stayed home from work yesterday.   I'm working today but don't feel good. ?The tightness was over left breast area.   It did not radiate.   It lasted maybe 5 minutes.   I did go sit down.   I'm sitting down now in my office.  ?I saw a cardiologist Dr. Margarita Rana recommended and did not find anything.   ?2. ONSET: "When did this breathing problem begin?"  ?    Over the last couple of weeks.   Today going up the stairs just now was really concerning me which is why I called due to the symptoms I'm having.   ?I'm a little short of breath now and have a fan blowing on me.      I little light headed right now.    ?3. PATTERN "Does the difficult breathing come and go, or has it been constant since it started?"  ?    *No Answer* ?4. SEVERITY: "How bad is your breathing?" (e.g., mild, moderate, severe)  ?  - MILD: No SOB at rest, mild SOB with walking, speaks normally in sentences, can lie down, no retractions, pulse < 100.  ?  - MODERATE: SOB at rest, SOB with minimal exertion and prefers to sit, cannot lie down flat, speaks in phrases, mild retractions, audible wheezing, pulse 100-120.  ?  - SEVERE: Very SOB at rest, speaks in single words, struggling to breathe, sitting hunched forward, retractions, pulse > 120  ?    *No Answer* ?5. RECURRENT SYMPTOM: "Have you had difficulty breathing before?" If Yes, ask: "When was the last time?" and "What happened that time?"  ?     *No Answer* ?6. CARDIAC HISTORY: "Do you have any history of heart disease?" (e.g., heart attack, angina, bypass surgery, angioplasty)  ?    No  Saw cardiologist but nothing was found ?7. LUNG HISTORY: "Do you have any history of lung disease?"  (e.g., pulmonary embolus, asthma, emphysema) ?    No ?8. CAUSE: "What do you think is causing the breathing problem?"  ?    *No Answer* ?9. OTHER SYMPTOMS: "Do you have any other symptoms? (e.g., dizziness, runny nose, cough, chest pain, fever) ?    Palpitations, heart pounding, chest tightness, short of breath, sweating, light headed  ?10. O2 SATURATION MONITOR:  "Do you use an oxygen saturation monitor (pulse oximeter) at home?" If Yes, "What is your reading (oxygen level) today?" "What is your usual oxygen saturation reading?" (e.g., 95%) ?      *No Answer* ?11. PREGNANCY: "Is there any chance you are pregnant?" "When was your last menstrual period?" ?      *No Answer* ?12. TRAVEL: "Have you traveled out of the country in the last month?" (e.g., travel history, exposures) ?      *No Answer* ? ?Answer Assessment - Initial Assessment Questions ?1. LOCATION: "Where does it hurt?"   ?  Over left breast ?2. RADIATION: "Does the pain go anywhere else?" (e.g., into neck, jaw, arms, back) ?    No ?3. ONSET: "When did the chest pain begin?" (Minutes, hours or days)  ?    A few minutes ago after coming up a flight of stairs but I've not been feeling good for a couple of weeks. ?4. PATTERN "Does the pain come and go, or has it been constant since it started?"  "Does it get worse with exertion?"  ?    *No Answer* ?5. DURATION: "How long does it last" (e.g., seconds, minutes, hours) ?    *No Answer* ?6. SEVERITY: "How bad is the pain?"  (e.g., Scale 1-10; mild, moderate, or severe) ?   - MILD (1-3): doesn't interfere with normal activities  ?   - MODERATE (4-7): interferes with normal activities or awakens from sleep ?   - SEVERE (8-10): excruciating pain, unable to do any normal  activities   ?    Maybe 5 minutes.  Chest tightness ?7. CARDIAC RISK FACTORS: "Do you have any history of heart problems or risk factors for heart disease?" (e.g., angina, prior heart attack; diabetes, high blood pressure, high cholesterol, smoker, or strong family history of heart disease) ?    No ?8. PULMONARY RISK FACTORS: "Do you have any history of lung disease?"  (e.g., blood clots in lung, asthma, emphysema, birth control pills) ?    No ?9. CAUSE: "What do you think is causing the chest pain?" ?    *No Answer* ?10. OTHER SYMPTOMS: "Do you have any other symptoms?" (e.g., dizziness, nausea, vomiting, sweating, fever, difficulty breathing, cough) ?      See shortness of breath protocol. ?11. PREGNANCY: "Is there any chance you are pregnant?" "When was your last menstrual period?" ?      *No Answer* ? ?Protocols used: Breathing Difficulty-A-AH, Chest Pain-A-AH ? ?

## 2021-06-29 ENCOUNTER — Encounter: Payer: Self-pay | Admitting: *Deleted

## 2021-07-10 ENCOUNTER — Telehealth: Payer: Self-pay

## 2021-07-17 NOTE — Telephone Encounter (Signed)
No additional notes needed  

## 2021-07-23 ENCOUNTER — Ambulatory Visit: Payer: 59 | Admitting: Family Medicine

## 2021-08-07 ENCOUNTER — Other Ambulatory Visit: Payer: Self-pay | Admitting: Physician Assistant

## 2021-08-07 DIAGNOSIS — E1169 Type 2 diabetes mellitus with other specified complication: Secondary | ICD-10-CM

## 2021-08-23 ENCOUNTER — Other Ambulatory Visit: Payer: Self-pay | Admitting: Family Medicine

## 2021-08-23 DIAGNOSIS — G8929 Other chronic pain: Secondary | ICD-10-CM

## 2021-09-11 ENCOUNTER — Ambulatory Visit: Payer: Self-pay

## 2021-09-11 ENCOUNTER — Ambulatory Visit: Payer: 59 | Admitting: Critical Care Medicine

## 2021-09-11 NOTE — Telephone Encounter (Signed)
    Chief Complaint: Right breast pain, no lump detected Symptoms: Pain at nipple Frequency: 2-3 weeks ago Pertinent Negatives: Patient denies discharge Disposition: '[]'$ ED /'[]'$ Urgent Care (no appt availability in office) / '[x]'$ Appointment(In office/virtual)/ '[]'$  [facility] [place] [facility]/ '[]'$ Home Care/ '[]'$ Refused Recommended Disposition /'[]'$ [place] Mobile [facility]/ '[]'$  Follow-up with PCP Additional Notes:   Reason for Disposition  [1] Breast pain AND [2] cause is not known  Answer Assessment - Initial Assessment Questions 1. SYMPTOM: "What's the main symptom you're concerned about?"  (e.g., lump, pain, rash, nipple discharge)     Pain right breast 2. LOCATION: "Where is the pain located?"     Right breast - at nipple 3. ONSET: "When did symptoms  start?"     2 -3 weeks  4. PRIOR HISTORY: "Do you have any history of prior problems with your breasts?" (e.g., lumps, cancer, fibrocystic breast disease)     No 5. CAUSE: "What do you think is causing this symptom?"     Unsure 6. OTHER SYMPTOMS: "Do you have any other symptoms?" (e.g., fever, breast pain, redness or rash, nipple discharge)     Eye pain 7. PREGNANCY-BREASTFEEDING: "Is there any chance you are pregnant?" "When was your last menstrual period?" "Are you breastfeeding?"     No  Protocols used: Breast Symptoms-A-AH

## 2021-09-24 ENCOUNTER — Other Ambulatory Visit: Payer: Self-pay | Admitting: Family Medicine

## 2021-09-24 DIAGNOSIS — G8929 Other chronic pain: Secondary | ICD-10-CM

## 2021-09-25 NOTE — Telephone Encounter (Signed)
Requested Prescriptions  Pending Prescriptions Disp Refills  . meloxicam (MOBIC) 15 MG tablet [Pharmacy Med Name: Meloxicam 15 MG Oral Tablet] 30 tablet 2    Sig: Take 1 tablet by mouth once daily     Analgesics:  COX2 Inhibitors Failed - 09/24/2021  2:21 PM      Failed - Manual Review: Labs are only required if the patient has taken medication for more than 8 weeks.      Passed - HGB in normal range and within 360 days    Hemoglobin  Date Value Ref Range Status  10/10/2020 13.0 12.0 - 15.0 g/dL Final  02/26/2017 12.6 11.1 - 15.9 g/dL Final         Passed - Cr in normal range and within 360 days    Creat  Date Value Ref Range Status  10/08/2013 0.87 0.50 - 1.10 mg/dL Final   Creatinine, Ser  Date Value Ref Range Status  03/12/2021 0.91 0.57 - 1.00 mg/dL Final         Passed - HCT in normal range and within 360 days    HCT  Date Value Ref Range Status  10/10/2020 39.6 36.0 - 46.0 % Final   Hematocrit  Date Value Ref Range Status  02/26/2017 38.0 34.0 - 46.6 % Final         Passed - AST in normal range and within 360 days    AST  Date Value Ref Range Status  01/24/2021 11 0 - 40 IU/L Final         Passed - ALT in normal range and within 360 days    ALT  Date Value Ref Range Status  01/24/2021 17 0 - 32 IU/L Final         Passed - eGFR is 30 or above and within 360 days    GFR, Est African American  Date Value Ref Range Status  10/08/2013 84 mL/min Final   GFR calc Af Amer  Date Value Ref Range Status  04/18/2020 76 >59 mL/min/1.73 Final    Comment:    **In accordance with recommendations from the NKF-ASN Task force,**   [facility] is in the process of updating its eGFR calculation to the   2021 CKD-EPI creatinine equation that estimates kidney function   without a race variable.    GFR, Est Non African American  Date Value Ref Range Status  10/08/2013 73 mL/min Final    Comment:      The estimated GFR is a calculation valid for adults (>=18 years  old) that uses the CKD-EPI algorithm to adjust for age and sex. It is   not to be used for children, pregnant women, hospitalized patients,    patients on dialysis, or with rapidly changing kidney function. According to the NKDEP, eGFR >89 is normal, 60-89 shows mild impairment, 30-59 shows moderate impairment, 15-29 shows severe impairment and <15 is ESRD.     GFR, Estimated  Date Value Ref Range Status  10/10/2020 >60 >60 mL/min Final    Comment:    (NOTE) Calculated using the CKD-EPI Creatinine Equation (2021)    eGFR  Date Value Ref Range Status  03/12/2021 69 >59 mL/min/1.73 Final         Passed - Patient is not pregnant      Passed - Valid encounter within last 12 months    Recent Outpatient Visits          7 months ago Acute non-recurrent frontal sinusitis   [place], [place], [place]   8 months ago Type 2 diabetes mellitus with other specified complication, without long-term current use of insulin ([place])   [place], Charlane Ferretti, MD   1 year ago COVID-19 virus infection   [place], [place], MD   1 year ago Type 2 diabetes mellitus with other neurologic complication, without long-term current use of insulin [facility])   [place], [place], [place]   1 year ago Type 2 diabetes mellitus with other neurologic complication, without long-term current use of insulin [facility])   [place], [place], MD      Future Appointments            In 1 week [place], [place]

## 2021-10-04 ENCOUNTER — Ambulatory Visit: Payer: 59 | Attending: Physician Assistant | Admitting: Physician Assistant

## 2021-10-04 ENCOUNTER — Other Ambulatory Visit: Payer: Self-pay

## 2021-10-04 VITALS — BP 137/85 | HR 63 | Wt 213.8 lb

## 2021-10-04 DIAGNOSIS — Z7902 Long term (current) use of antithrombotics/antiplatelets: Secondary | ICD-10-CM | POA: Diagnosis not present

## 2021-10-04 DIAGNOSIS — E1169 Type 2 diabetes mellitus with other specified complication: Secondary | ICD-10-CM | POA: Insufficient documentation

## 2021-10-04 DIAGNOSIS — Z794 Long term (current) use of insulin: Secondary | ICD-10-CM | POA: Diagnosis not present

## 2021-10-04 DIAGNOSIS — E785 Hyperlipidemia, unspecified: Secondary | ICD-10-CM | POA: Insufficient documentation

## 2021-10-04 DIAGNOSIS — E559 Vitamin D deficiency, unspecified: Secondary | ICD-10-CM | POA: Insufficient documentation

## 2021-10-04 DIAGNOSIS — R0789 Other chest pain: Secondary | ICD-10-CM | POA: Insufficient documentation

## 2021-10-04 DIAGNOSIS — Z7984 Long term (current) use of oral hypoglycemic drugs: Secondary | ICD-10-CM | POA: Insufficient documentation

## 2021-10-04 DIAGNOSIS — Z760 Encounter for issue of repeat prescription: Secondary | ICD-10-CM | POA: Insufficient documentation

## 2021-10-04 DIAGNOSIS — H6981 Other specified disorders of Eustachian tube, right ear: Secondary | ICD-10-CM

## 2021-10-04 DIAGNOSIS — Z79899 Other long term (current) drug therapy: Secondary | ICD-10-CM | POA: Insufficient documentation

## 2021-10-04 DIAGNOSIS — R0602 Shortness of breath: Secondary | ICD-10-CM | POA: Diagnosis not present

## 2021-10-04 DIAGNOSIS — E1159 Type 2 diabetes mellitus with other circulatory complications: Secondary | ICD-10-CM | POA: Diagnosis not present

## 2021-10-04 DIAGNOSIS — E039 Hypothyroidism, unspecified: Secondary | ICD-10-CM | POA: Insufficient documentation

## 2021-10-04 DIAGNOSIS — I152 Hypertension secondary to endocrine disorders: Secondary | ICD-10-CM

## 2021-10-04 DIAGNOSIS — R42 Dizziness and giddiness: Secondary | ICD-10-CM | POA: Diagnosis not present

## 2021-10-04 DIAGNOSIS — E890 Postprocedural hypothyroidism: Secondary | ICD-10-CM

## 2021-10-04 LAB — POCT GLYCOSYLATED HEMOGLOBIN (HGB A1C): HbA1c, POC (controlled diabetic range): 7.5 % — AB (ref 0.0–7.0)

## 2021-10-04 LAB — GLUCOSE, POCT (MANUAL RESULT ENTRY): POC Glucose: 144 mg/dl — AB (ref 70–99)

## 2021-10-04 MED ORDER — LEVOTHYROXINE SODIUM 75 MCG PO TABS: 75.0000 ug | ORAL_TABLET | Freq: Every day | ORAL | 1 refills | Status: DC

## 2021-10-04 MED ORDER — EZETIMIBE 10 MG PO TABS: 10.0000 mg | ORAL_TABLET | Freq: Every day | ORAL | 3 refills | Status: AC

## 2021-10-04 MED ORDER — HYDROCHLOROTHIAZIDE 12.5 MG PO CAPS: 12.5000 mg | ORAL_CAPSULE | Freq: Every day | ORAL | 1 refills | Status: AC

## 2021-10-04 MED ORDER — METFORMIN HCL 500 MG PO TABS: 1000.0000 mg | ORAL_TABLET | Freq: Two times a day (BID) | ORAL | 1 refills | Status: DC

## 2021-10-04 MED ORDER — ATORVASTATIN CALCIUM 40 MG PO TABS: 40.0000 mg | ORAL_TABLET | Freq: Every day | ORAL | 1 refills | Status: AC

## 2021-10-04 MED ORDER — FLUTICASONE PROPIONATE 50 MCG/ACT NA SUSP: 2.0000 | Freq: Every day | NASAL | 6 refills | Status: AC

## 2021-10-04 NOTE — Progress Notes (Signed)
Stacy Bryan, is a 66 y.o. female  OYD:[id number]  EHM:[id number]  DOB - 01-26-1956  Chief Complaint  Patient presents with   Headache   Dizziness       Subjective:   Stacy Bryan is a 66 y.o. female here today for med RF.  She never started taking the increased dose of metformin; so she has only been taking 1043m metformin daily.    She is c/o increased frequency of chest squeezing, dizziness and SOB.  She has not seen cardiologist in about 6 months.  No radiating pain.  Not pressure. This has not changed but it seems to be occurring more often.  Most recently occurred last night while ironing.  She sat down for a few mins and it resolved all together.  Dizziness has been long standing and treated/worked up various times.    She stopped taking cymbalta about 2 months ago on her own.  She may want to resume this.    No problems updated.  ALLERGIES: No Known Allergies  PAST MEDICAL HISTORY: Past Medical History:  Diagnosis Date   Abnormal Pap smear    Allergy    Anemia    GERD (gastroesophageal reflux disease)    Hyperlipidemia    Hypertension    Thyroid disease    Vertigo     MEDICATIONS AT HOME: Prior to Admission medications   Medication Sig Start Date End Date Taking? Authorizing Provider  aspirin EC 81 MG tablet Take 81 mg by mouth daily.   Yes [provider]  Blood Glucose Monitoring Suppl (ACCU-CHEK GUIDE ME) w/Device KIT Use daily 09/30/19  Yes NCharlott Rakes MD  calcium carbonate (TITRALAC) 420 MG CHEW chewable tablet Chew by mouth.   Yes [provider]  cyclobenzaprine (FLEXERIL) 10 MG tablet Take 1 tablet (10 mg total) by mouth 2 (two) times daily as needed for muscle spasms. 01/23/21  Yes NCharlott Rakes MD  meclizine (ANTIVERT) 25 MG tablet Take 1 tablet (25 mg total) by mouth 3 (three) times daily as needed for dizziness. 09/30/19  Yes NCharlott Rakes MD  albuterol (VENTOLIN HFA) 108 (90 Base) MCG/ACT inhaler Inhale 2 puffs into  the lungs every 6 (six) hours as needed for wheezing or shortness of breath. Patient not taking: Reported on 10/04/2021 02/20/21   NCharlott Rakes MD  atorvastatin (LIPITOR) 40 MG tablet Take 1 tablet (40 mg total) by mouth daily. 10/04/21   MArgentina Donovan PA-C  Cholecalciferol (VITAMIN D-3 PO) Take 400 Units by mouth daily.  Patient not taking: Reported on 10/04/2021    [provider]  diclofenac Sodium (VOLTAREN) 1 % GEL Apply 4 g topically 4 (four) times daily as needed. Patient not taking: Reported on 10/04/2021 06/08/19   Hilts, MLegrand Como MD  DULoxetine (CYMBALTA) 60 MG capsule Take 1 capsule (60 mg total) by mouth daily. Patient not taking: Reported on 10/04/2021 01/23/21   NCharlott Rakes MD  ezetimibe (ZETIA) 10 MG tablet Take 1 tablet (10 mg total) by mouth daily. 10/04/21   MArgentina Donovan PA-C  fish oil-omega-3 fatty acids 1000 MG capsule Take 2 g by mouth daily. Patient not taking: Reported on 10/04/2021    [provider]  fluticasone (FLONASE) 50 MCG/ACT nasal spray Place 2 sprays into both nostrils daily. 10/04/21   MArgentina Donovan PA-C  glucose blood (ACCU-CHEK GUIDE) test strip Use as instructed daily Patient not taking: Reported on 10/04/2021 09/30/19   NCharlott Rakes MD  hydrochlorothiazide (MICROZIDE) 12.5 MG capsule Take 1 capsule (12.5  mg total) by mouth daily. 10/04/21   Argentina Donovan, PA-C  HYDROcodone-acetaminophen (NORCO/VICODIN) 5-325 MG tablet Take 1 tablet by mouth every 4 (four) hours as needed. Patient not taking: Reported on 03/12/2021 12/11/20   Charlann Lange, PA-C  levothyroxine (EUTHYROX) 75 MCG tablet Take 1 tablet (75 mcg total) by mouth daily before breakfast. 10/04/21 04/02/22  Argentina Donovan, PA-C  magnesium oxide (MAG-OX) 400 MG tablet Take 400 mg by mouth daily.  Patient not taking: Reported on 10/04/2021    [provider]  meloxicam (MOBIC) 15 MG tablet Take 1 tablet by mouth once daily Patient not taking: Reported on  10/04/2021 09/25/21   Charlott Rakes, MD  metFORMIN (GLUCOPHAGE) 500 MG tablet Take 2 tablets (1,000 mg total) by mouth 2 (two) times daily with a meal. 10/04/21   Drayson Dorko, Dionne Bucy, PA-C  metroNIDAZOLE (FLAGYL) 500 MG tablet Take 1 tablet (500 mg total) by mouth 2 (two) times daily. Patient not taking: Reported on 10/04/2021 01/04/20   Charlott Rakes, MD  metroNIDAZOLE (METROGEL VAGINAL) 0.75 % vaginal gel Place 1 Applicatorful vaginally at bedtime. 01/04/20   Charlott Rakes, MD  OVER THE COUNTER MEDICATION Chlor Tab, two tablets daily for allergies. Patient not taking: Reported on 10/04/2021    [provider]  Vitamin D, Ergocalciferol, (DRISDOL) 1.25 MG (50000 UNIT) CAPS capsule Take 1 capsule (50,000 Units total) by mouth every 7 (seven) days. Patient not taking: Reported on 10/04/2021 04/19/20   Argentina Donovan, PA-C    ROS: Neg HEENT Neg GI Neg GU Neg MS Neg psych  Objective:   Vitals:   10/04/21 1014  BP: 137/85  Pulse: 63  SpO2: 95%  Weight: 213 lb 12.8 oz (97 kg)   Exam General appearance : Awake, alert, not in any distress. Speech Clear. Not toxic looking HEENT: Atraumatic and Normocephalic, pupils equally reactive to light and accomodation, EOM intact B without nystagmus.   Neck: Supple, no JVD. No cervical lymphadenopathy. No bruit B Chest: Good air entry bilaterally, CTAB.  No rales/rhonchi/wheezing CVS: S1 S2 regular, no murmurs.  Extremities: B/L Lower Ext shows no edema, both legs are warm to touch Neurology: Awake alert, and oriented X 3, CN II-XII intact, Non focal Skin: No Rash  Data Review Lab Results  Component Value Date   HGBA1C 7.5 (A) 10/04/2021   HGBA1C 6.6 01/23/2021   HGBA1C 6.5 (H) 04/18/2020    Assessment & Plan   1. Chest tightness No ST changes.  EKG reviewed with Dr Margarita Rana.  To ED/call 911 if CP worsens or becomes different.  Patient agrees to call and schedule an appt with her cardiologist - Lipid panel - Comprehensive metabolic  panel  2. Dizziness This has been a fleeting and ongoing for years-she feels it has increased.  No red flags today - Comprehensive metabolic panel - CBC with Differential/Platelet  3. Type 2 diabetes mellitus with other specified complication, without long-term current use of insulin (HCC) Uncontrolled-she was only taking 1000 mg metformin daily-titrate to bid - Glucose (CBG) - HgB A1c - Comprehensive metabolic panel - CBC with Differential/Platelet - Thyroid Panel With TSH - metFORMIN (GLUCOPHAGE) 500 MG tablet; Take 2 tablets (1,000 mg total) by mouth 2 (two) times daily with a meal.  Dispense: 360 tablet; Refill: 1  4. Hypertension associated with diabetes ([place]) - Comprehensive metabolic panel - CBC with Differential/Platelet - hydrochlorothiazide (MICROZIDE) 12.5 MG capsule; Take 1 capsule (12.5 mg total) by mouth daily.  Dispense: 90 capsule; Refill: 1  5.  Dyslipidemia associated with type 2 diabetes mellitus (HCC) - Lipid panel - Comprehensive metabolic panel - CBC with Differential/Platelet - ezetimibe (ZETIA) 10 MG tablet; Take 1 tablet (10 mg total) by mouth daily.  Dispense: 90 tablet; Refill: 3 - atorvastatin (LIPITOR) 40 MG tablet; Take 1 tablet (40 mg total) by mouth daily.  Dispense: 90 tablet; Refill: 1  6. Hypothyroidism, postablative - Thyroid Panel With TSH - levothyroxine (EUTHYROX) 75 MCG tablet; Take 1 tablet (75 mcg total) by mouth daily before breakfast.  Dispense: 90 tablet; Refill: 1  7. Vitamin D deficiency - Vitamin D, 25-hydroxy  8. Dysfunction of right eustachian tube - fluticasone (FLONASE) 50 MCG/ACT nasal spray; Place 2 sprays into both nostrils daily.  Dispense: 16 g; Refill: 6    Return in about 3 months (around 01/04/2022) for PCP for chronic conditions.  The patient was given clear instructions to go to ER or return to medical center if symptoms don't improve, worsen or new problems develop. The patient verbalized understanding. The patient  was told to call to get lab results if they haven't heard anything in the next week.      Freeman Caldron, PA-C [facility] [place], [place]   10/04/2021, 11:09 AM Patient ID: Stacy Bryan, female   DOB: [birth year], 66 y.o.   MRN: [id number]

## 2021-10-18 ENCOUNTER — Telehealth: Payer: Self-pay | Admitting: Interventional Cardiology

## 2021-10-18 ENCOUNTER — Ambulatory Visit: Payer: 59 | Admitting: Podiatry

## 2021-10-18 NOTE — Telephone Encounter (Signed)
  Per MyChart scheduling message:  Pt c/o of Chest Pain: STAT if CP now or developed within 24 hours  1. Are you having CP right now?   2. Are you experiencing any other symptoms (ex. SOB, nausea, vomiting, sweating)?   3. How long have you been experiencing CP?   4. Is your CP continuous or coming and going?   5. Have you taken Nitroglycerin?   chest pains come and go, tightness, sweating and dizziness.  Once i sit or lay down which ever i can do it goes away.  I have seen dr Tamala Julian once before for the same thing and there were no findings.  I have been seen in emergency and nothing. it is just starting to be more frequent  no i have never taken nitroglycerin ?

## 2021-10-18 NOTE — Telephone Encounter (Signed)
Patient called, no answer, left message to call back. 10/18/21, 920 am.

## 2021-10-18 NOTE — Telephone Encounter (Signed)
Called mobile number listed, and work number listed; No answer. 955 am 10/18/21  Called Stacy Bryan, unable to reach patient.  Ms. Lovena Le stated she would reach out to her mother as well.  1005 am 10/18/21  F/u regarding pt concern still required.

## 2021-10-18 NOTE — Telephone Encounter (Signed)
Contacted patient after multiple attempts.   Pt stated symptoms of chest tightness / pain, SOB, lightheadedness began on Saturday, 10/13/2021.  ( 3-4 episodes total. )  She also stated was symptomatic Sunday, and this morning with similar symptoms 10/18/21.  Pt asked why she didn't go to the ER for her symptoms, and stated she rested to reduce / eliminate symptoms.    Pt was asymptomatic at time of my telephone call;    Pt advised to go to the ER if her symptoms return, or call 911 for medic transport.    Pt was open to see DOD tomorrow, Dr. Angelena Form at [facility] on [address].  10/19/2021.  She is scheduled for a 330 pm appointment.    Pt understands if severity of symptoms return between now and then, to go to nearest ER.

## 2021-10-19 ENCOUNTER — Ambulatory Visit: Payer: 59 | Admitting: Cardiovascular Disease

## 2021-10-19 ENCOUNTER — Encounter: Payer: Self-pay | Admitting: Cardiovascular Disease

## 2021-10-19 VITALS — BP 112/68 | HR 67 | Ht 70.0 in | Wt 204.6 lb

## 2021-10-19 DIAGNOSIS — I200 Unstable angina: Secondary | ICD-10-CM | POA: Diagnosis not present

## 2021-10-19 MED ORDER — ISOSORBIDE MONONITRATE ER 30 MG PO TB24: 30.0000 mg | ORAL_TABLET | Freq: Every day | ORAL | 3 refills | Status: DC

## 2021-10-19 MED ORDER — NITROGLYCERIN 0.4 MG SL SUBL: 0.4000 mg | SUBLINGUAL_TABLET | SUBLINGUAL | 3 refills | Status: DC | PRN

## 2021-10-19 NOTE — Progress Notes (Signed)
Chief Complaint  Patient presents with   Follow-up    Chest pain   History of Present Illness: 66 yo female with history of GERD, DM, HLD, HTN, anemia, former tobacco abuse and thyroid disease who is here today for cardiac follow up. She was seen in our office in November 2022 by Dr. Tamala Julian for evaluation of chest pain. Dr. Tamala Julian ordered a coronary CTA given her risk factors for CAD. This was not completed by the patient. She has a strong family history of CAD in her sisters, brother and father. She is a former smoker, diabetic with HTN and HLD. She is added onto my DOD schedule today after calling in yesterday reporting recurrent chest pain. She tells me that she has had episodes of left sided chest pain/pressure for the past 2 months. This has worsened over the past week. There is associated with dizziness and dyspnea. The pain occurs at rest and with exertion. Each episode lasts for ten minutes and resolves with rest. She does not feel normal today but she has had no chest pain today.   Primary Care Physician: Charlott Rakes, MD Primary Cardiology: Pernell Dupre   Past Medical History:  Diagnosis Date   Abnormal Pap smear    Allergy    Anemia    GERD (gastroesophageal reflux disease)    Hyperlipidemia    Hypertension    Thyroid disease    Vertigo     Past Surgical History:  Procedure Laterality Date   ABDOMINAL HYSTERECTOMY  1997   total   FOOT SURGERY     both feet  20 yrs ago   OVARY SURGERY  1980's   removal of R ovary (cyst)    Current Outpatient Medications  Medication Sig Dispense Refill   albuterol (VENTOLIN HFA) 108 (90 Base) MCG/ACT inhaler Inhale 2 puffs into the lungs every 6 (six) hours as needed for wheezing or shortness of breath. 8 g 2   aspirin EC 81 MG tablet Take 81 mg by mouth daily.     atorvastatin (LIPITOR) 40 MG tablet Take 1 tablet (40 mg total) by mouth daily. 90 tablet 1   Blood Glucose Monitoring Suppl (ACCU-CHEK GUIDE ME) w/Device KIT Use daily 1  kit 0   calcium carbonate (TITRALAC) 420 MG CHEW chewable tablet Chew by mouth.     Cholecalciferol (VITAMIN D-3 PO) Take 400 Units by mouth daily.     cyclobenzaprine (FLEXERIL) 10 MG tablet Take 1 tablet (10 mg total) by mouth 2 (two) times daily as needed for muscle spasms. 60 tablet 1   diclofenac Sodium (VOLTAREN) 1 % GEL Apply 4 g topically 4 (four) times daily as needed. 500 g 6   DULoxetine (CYMBALTA) 60 MG capsule Take 1 capsule (60 mg total) by mouth daily. 30 capsule 3   ezetimibe (ZETIA) 10 MG tablet Take 1 tablet (10 mg total) by mouth daily. 90 tablet 3   fish oil-omega-3 fatty acids 1000 MG capsule Take 2 g by mouth daily.     fluticasone (FLONASE) 50 MCG/ACT nasal spray Place 2 sprays into both nostrils daily. 16 g 6   glucose blood (ACCU-CHEK GUIDE) test strip Use as instructed daily 100 each 3   hydrochlorothiazide (MICROZIDE) 12.5 MG capsule Take 1 capsule (12.5 mg total) by mouth daily. 90 capsule 1   HYDROcodone-acetaminophen (NORCO/VICODIN) 5-325 MG tablet Take 1 tablet by mouth every 4 (four) hours as needed. 10 tablet 0   isosorbide mononitrate (IMDUR) 30 MG 24 hr tablet Take 1  tablet (30 mg total) by mouth daily. 90 tablet 3   levothyroxine (EUTHYROX) 75 MCG tablet Take 1 tablet (75 mcg total) by mouth daily before breakfast. 90 tablet 1   magnesium oxide (MAG-OX) 400 MG tablet Take 400 mg by mouth daily.     meclizine (ANTIVERT) 25 MG tablet Take 1 tablet (25 mg total) by mouth 3 (three) times daily as needed for dizziness. 60 tablet 1   meloxicam (MOBIC) 15 MG tablet Take 1 tablet by mouth once daily 30 tablet 2   metFORMIN (GLUCOPHAGE) 500 MG tablet Take 2 tablets (1,000 mg total) by mouth 2 (two) times daily with a meal. 360 tablet 1   metroNIDAZOLE (FLAGYL) 500 MG tablet Take 1 tablet (500 mg total) by mouth 2 (two) times daily. 14 tablet 0   nitroGLYCERIN (NITROSTAT) 0.4 MG SL tablet Place 1 tablet (0.4 mg total) under the tongue every 5 (five) minutes as needed for  chest pain. 25 tablet 3   OVER THE COUNTER MEDICATION Chlor Tab, two tablets daily for allergies.     Vitamin D, Ergocalciferol, (DRISDOL) 1.25 MG (50000 UNIT) CAPS capsule Take 1 capsule (50,000 Units total) by mouth every 7 (seven) days. 16 capsule 0   Current Facility-Administered Medications  Medication Dose Route Frequency Provider Last Rate Last Admin   0.9 %  sodium chloride infusion  500 mL Intravenous Once Armbruster, Carlota Raspberry, MD        No Known Allergies  Social History   Socioeconomic History   Marital status: Single    Spouse name: Not on file   Number of children: Not on file   Years of education: Not on file   Highest education level: Not on file  Occupational History   Not on file  Tobacco Use   Smoking status: Former    Types: Cigarettes    Quit date: 06/02/2006    Years since quitting: 15.3   Smokeless tobacco: Never  Vaping Use   Vaping Use: Never used  Substance and Sexual Activity   Alcohol use: Yes    Comment: occassionally   Drug use: No   Sexual activity: Not on file  Other Topics Concern   Not on file  Social History Narrative   Not on file   Social Determinants of Health   Financial Resource Strain: Not on file  Food Insecurity: Not on file  Transportation Needs: Not on file  Physical Activity: Not on file  Stress: Not on file  Social Connections: Not on file  Intimate Partner Violence: Not on file    Family History  Problem Relation Age of Onset   Hypertension Mother    Heart disease Father    Asthma Father    Hyperlipidemia Father    Asthma Sister    Breast cancer Sister    Stomach cancer Sister    Thyroid disease Sister        Surgery   Heart disease Sister    Diabetes Sister    Gout Sister    Diabetes Sister    Ulcers Sister    Cancer Maternal Grandmother    Colon cancer Neg Hx    Esophageal cancer Neg Hx    Liver cancer Neg Hx    Pancreatic cancer Neg Hx    Rectal cancer Neg Hx     Review of Systems:  As stated in  the HPI and otherwise negative.   BP 112/68   Pulse 67   Ht _0  (1.778 m)  Wt 204 lb 9.6 oz (92.8 kg)   SpO2 96%   BMI 29.36 kg/m   Physical Examination: General: Well developed, well nourished, NAD  HEENT: OP clear, mucus membranes moist  SKIN: warm, dry. No rashes. Neuro: No focal deficits  Musculoskeletal: Muscle strength 5/5 all ext  Psychiatric: Mood and affect normal  Neck: No JVD, no carotid bruits, no thyromegaly, no lymphadenopathy.  Lungs:Clear bilaterally, no wheezes, rhonci, crackles Cardiovascular: Regular rate and rhythm. No murmurs, gallops or rubs. Abdomen:Soft. Bowel sounds present. Non-tender.  Extremities: No lower extremity edema. Pulses are 2 + in the bilateral DP/PT.  EKG:  EKG is ordered today. The ekg ordered today demonstrates Sinus. No change from EKG in June 2023  Recent Labs: 01/24/2021: ALT 17 03/12/2021: BUN 16; Creatinine, Ser 0.91; Potassium 4.0; Sodium 142   Lipid Panel    Component Value Date/Time   CHOL 200 (H) 01/24/2021 0953   TRIG 124 01/24/2021 0953   HDL 53 01/24/2021 0953   CHOLHDL 3.8 01/24/2021 0953   CHOLHDL 4.8 10/08/2013 1130   VLDL 35 10/08/2013 1130   LDLCALC 125 (H) 01/24/2021 0953     Wt Readings from Last 3 Encounters:  10/19/21 204 lb 9.6 oz (92.8 kg)  10/04/21 213 lb 12.8 oz (97 kg)  03/12/21 212 lb 3.2 oz (96.3 kg)     Assessment and Plan:   1. Unstable angina: Her chest pain is concerning for unstable angina. Given her risk factors for CAD including DM, HTN, HLD, former tobacco abuse and strong family history of CAD, I think cardiac catheterization is indicated.  I have reviewed the risks, indications, and alternatives to cardiac catheterization, possible angioplasty, and stenting with the patient. Risks include but are not limited to bleeding, infection, vascular injury, stroke, myocardial infection, arrhythmia, kidney injury, radiation-related injury in the case of prolonged fluoroscopy use, emergency  cardiac surgery, and death. The patient understands the risks of serious complication is 1-2 in 7616 with diagnostic cardiac cath and 1-2% or less with angioplasty/stenting. -I will arrange a cardiac cath on 10/24/21 at [facility] with Dr. Ali Lowe. Dr. Tamala Julian is not available for caths next week and my outpatient cath schedule is full next week.  -BMET and CBC today -I will start Imdur 30 mg daily and make sure she has SL NTG to use as needed.  -Continue ASA and statin -Will arrange an echo to assess LVEF, exclude structural heart disease.    Labs/ tests ordered today include:   Orders Placed This Encounter  Procedures   Basic metabolic panel   CBC   EKG 12-Lead   ECHOCARDIOGRAM COMPLETE     Disposition:   F/U with Dr. Tamala Julian 3-4 weeks post cath   Signed, Lauree Chandler, MD 10/19/2021 4:32 PM    [facility], [place], [place]  [address] Phone: [phone]; Fax: [phone]

## 2021-10-19 NOTE — Patient Instructions (Signed)
Medication Instructions:  Your physician has recommended you make the following change in your medication:  START: isosorbide mononitrate (Imdur) 30 mg by mouth once daily  START: Nitroglycerin 0.4 mg under your tongue as needed for Chest Pain  If you have chest pain place 1 tablet under your tongue, wait 5 min If chest pain continues place a 2nd tablet under your tongue, wait 5 min If chest pain continues place a 3rd tablet under your tongue, wait 5 min If chest pain continues call 911    *If you need a refill on your cardiac medications before your next appointment, please call your pharmacy*   Lab Work: TODAY: CBC, BMET If you have labs (blood work) drawn today and your tests are completely normal, you will receive your results only by: MyChart Message (if you have MyChart) OR A paper copy in the mail If you have any lab test that is abnormal or we need to change your treatment, we will call you to review the results.   Testing/Procedures: Your physician has requested that you have an echocardiogram. Echocardiography is a painless test that uses sound waves to create images of your heart. It provides your doctor with information about the size and shape of your heart and how well your heart's chambers and valves are working. This procedure takes approximately one hour. There are no restrictions for this procedure.  Your physician has requested that you have a cardiac catheterization. Cardiac catheterization is used to diagnose and/or treat various heart conditions. Doctors may recommend this procedure for a number of different reasons. The most common reason is to evaluate chest pain. Chest pain can be a symptom of coronary artery disease (CAD), and cardiac catheterization can show whether plaque is narrowing or blocking your heart's arteries. This procedure is also used to evaluate the valves, as well as measure the blood flow and oxygen levels in different parts of your heart. For  further information please visit [email or web address]. Please follow instruction sheet, as given.    Follow-Up: At [facility], you and your health needs are our priority.  As part of our continuing mission to provide you with exceptional heart care, we have created designated Provider Care Teams.  These Care Teams include your primary Cardiologist (physician) and Advanced Practice Providers (APPs -  Physician Assistants and Nurse Practitioners) who all work together to provide you with the care you need, when you need it.  Your next appointment:   3 - 4 week(s)  The format for your next appointment:   In Person  Provider:  Dr. Tamala Julian or APP    Other Instructions   You are scheduled for a Cardiac Catheterization on Wednesday, July 12 with Dr. Lenna Sciara.  1. Please arrive at the Main Entrance A at [facility]: [place], [place] [address] at 6:30 AM (This time is two hours before your procedure to ensure your preparation). Free valet parking service is available.   Special note: Every effort is made to have your procedure done on time. Please understand that emergencies sometimes delay scheduled procedures.  2. Diet: Do not eat solid foods after midnight.  You may have clear liquids until 5 AM upon the day of the procedure.  3. Labs: TODAY  4. Medication instructions in preparation for your procedure:   Contrast Allergy: No  Do Not take Hydrochlorothiazide on the morning of heart cath    Do not take Diabetes Med Glucophage (Metformin) on the day of the procedure and  HOLD 48 HOURS AFTER THE PROCEDURE.  On the morning of your procedure, take Aspirin 81 mg  and any morning medicines NOT listed above.  You may use sips of water.  5. Plan to go home the same day, you will only stay overnight if medically necessary. 6. You MUST have a responsible adult to drive you home. 7. An adult MUST be with you the first 24 hours after you arrive home. 8. Bring a  current list of your medications, and the last time and date medication taken. 9. Bring ID and current insurance cards. 10.Please wear clothes that are easy to get on and off and wear slip-on shoes.  Thank you for allowing [place] to care for you!   -- [facility]   Important Information About Sugar

## 2021-10-19 NOTE — H&P (View-Only) (Signed)
Chief Complaint  Patient presents with   Follow-up    Chest pain   History of Present Illness: 66 yo female with history of GERD, DM, HLD, HTN, anemia, former tobacco abuse and thyroid disease who is here today for cardiac follow up. She was seen in our office in November 2022 by Dr. Tamala Julian for evaluation of chest pain. Dr. Tamala Julian ordered a coronary CTA given her risk factors for CAD. This was not completed by the patient. She has a strong family history of CAD in her sisters, brother and father. She is a former smoker, diabetic with HTN and HLD. She is added onto my DOD schedule today after calling in yesterday reporting recurrent chest pain. She tells me that she has had episodes of left sided chest pain/pressure for the past 2 months. This has worsened over the past week. There is associated with dizziness and dyspnea. The pain occurs at rest and with exertion. Each episode lasts for ten minutes and resolves with rest. She does not feel normal today but she has had no chest pain today.   Primary Care Physician: Charlott Rakes, MD Primary Cardiology: Pernell Dupre   Past Medical History:  Diagnosis Date   Abnormal Pap smear    Allergy    Anemia    GERD (gastroesophageal reflux disease)    Hyperlipidemia    Hypertension    Thyroid disease    Vertigo     Past Surgical History:  Procedure Laterality Date   ABDOMINAL HYSTERECTOMY  1997   total   FOOT SURGERY     both feet  20 yrs ago   OVARY SURGERY  1980's   removal of R ovary (cyst)    Current Outpatient Medications  Medication Sig Dispense Refill   albuterol (VENTOLIN HFA) 108 (90 Base) MCG/ACT inhaler Inhale 2 puffs into the lungs every 6 (six) hours as needed for wheezing or shortness of breath. 8 g 2   aspirin EC 81 MG tablet Take 81 mg by mouth daily.     atorvastatin (LIPITOR) 40 MG tablet Take 1 tablet (40 mg total) by mouth daily. 90 tablet 1   Blood Glucose Monitoring Suppl (ACCU-CHEK GUIDE ME) w/Device KIT Use daily 1  kit 0   calcium carbonate (TITRALAC) 420 MG CHEW chewable tablet Chew by mouth.     Cholecalciferol (VITAMIN D-3 PO) Take 400 Units by mouth daily.     cyclobenzaprine (FLEXERIL) 10 MG tablet Take 1 tablet (10 mg total) by mouth 2 (two) times daily as needed for muscle spasms. 60 tablet 1   diclofenac Sodium (VOLTAREN) 1 % GEL Apply 4 g topically 4 (four) times daily as needed. 500 g 6   DULoxetine (CYMBALTA) 60 MG capsule Take 1 capsule (60 mg total) by mouth daily. 30 capsule 3   ezetimibe (ZETIA) 10 MG tablet Take 1 tablet (10 mg total) by mouth daily. 90 tablet 3   fish oil-omega-3 fatty acids 1000 MG capsule Take 2 g by mouth daily.     fluticasone (FLONASE) 50 MCG/ACT nasal spray Place 2 sprays into both nostrils daily. 16 g 6   glucose blood (ACCU-CHEK GUIDE) test strip Use as instructed daily 100 each 3   hydrochlorothiazide (MICROZIDE) 12.5 MG capsule Take 1 capsule (12.5 mg total) by mouth daily. 90 capsule 1   HYDROcodone-acetaminophen (NORCO/VICODIN) 5-325 MG tablet Take 1 tablet by mouth every 4 (four) hours as needed. 10 tablet 0   isosorbide mononitrate (IMDUR) 30 MG 24 hr tablet Take 1  tablet (30 mg total) by mouth daily. 90 tablet 3   levothyroxine (EUTHYROX) 75 MCG tablet Take 1 tablet (75 mcg total) by mouth daily before breakfast. 90 tablet 1   magnesium oxide (MAG-OX) 400 MG tablet Take 400 mg by mouth daily.     meclizine (ANTIVERT) 25 MG tablet Take 1 tablet (25 mg total) by mouth 3 (three) times daily as needed for dizziness. 60 tablet 1   meloxicam (MOBIC) 15 MG tablet Take 1 tablet by mouth once daily 30 tablet 2   metFORMIN (GLUCOPHAGE) 500 MG tablet Take 2 tablets (1,000 mg total) by mouth 2 (two) times daily with a meal. 360 tablet 1   metroNIDAZOLE (FLAGYL) 500 MG tablet Take 1 tablet (500 mg total) by mouth 2 (two) times daily. 14 tablet 0   nitroGLYCERIN (NITROSTAT) 0.4 MG SL tablet Place 1 tablet (0.4 mg total) under the tongue every 5 (five) minutes as needed for  chest pain. 25 tablet 3   OVER THE COUNTER MEDICATION Chlor Tab, two tablets daily for allergies.     Vitamin D, Ergocalciferol, (DRISDOL) 1.25 MG (50000 UNIT) CAPS capsule Take 1 capsule (50,000 Units total) by mouth every 7 (seven) days. 16 capsule 0   Current Facility-Administered Medications  Medication Dose Route Frequency Provider Last Rate Last Admin   0.9 %  sodium chloride infusion  500 mL Intravenous Once Armbruster, Steven P, MD        No Known Allergies  Social History   Socioeconomic History   Marital status: Single    Spouse name: Not on file   Number of children: Not on file   Years of education: Not on file   Highest education level: Not on file  Occupational History   Not on file  Tobacco Use   Smoking status: Former    Types: Cigarettes    Quit date: 06/02/2006    Years since quitting: 15.3   Smokeless tobacco: Never  Vaping Use   Vaping Use: Never used  Substance and Sexual Activity   Alcohol use: Yes    Comment: occassionally   Drug use: No   Sexual activity: Not on file  Other Topics Concern   Not on file  Social History Narrative   Not on file   Social Determinants of Health   Financial Resource Strain: Not on file  Food Insecurity: Not on file  Transportation Needs: Not on file  Physical Activity: Not on file  Stress: Not on file  Social Connections: Not on file  Intimate Partner Violence: Not on file    Family History  Problem Relation Age of Onset   Hypertension Mother    Heart disease Father    Asthma Father    Hyperlipidemia Father    Asthma Sister    Breast cancer Sister    Stomach cancer Sister    Thyroid disease Sister        Surgery   Heart disease Sister    Diabetes Sister    Gout Sister    Diabetes Sister    Ulcers Sister    Cancer Maternal Grandmother    Colon cancer Neg Hx    Esophageal cancer Neg Hx    Liver cancer Neg Hx    Pancreatic cancer Neg Hx    Rectal cancer Neg Hx     Review of Systems:  As stated in  the HPI and otherwise negative.   BP 112/68   Pulse 67   Ht 5' 10" (1.778 m)     Wt 204 lb 9.6 oz (92.8 kg)   SpO2 96%   BMI 29.36 kg/m   Physical Examination: General: Well developed, well nourished, NAD  HEENT: OP clear, mucus membranes moist  SKIN: warm, dry. No rashes. Neuro: No focal deficits  Musculoskeletal: Muscle strength 5/5 all ext  Psychiatric: Mood and affect normal  Neck: No JVD, no carotid bruits, no thyromegaly, no lymphadenopathy.  Lungs:Clear bilaterally, no wheezes, rhonci, crackles Cardiovascular: Regular rate and rhythm. No murmurs, gallops or rubs. Abdomen:Soft. Bowel sounds present. Non-tender.  Extremities: No lower extremity edema. Pulses are 2 + in the bilateral DP/PT.  EKG:  EKG is ordered today. The ekg ordered today demonstrates Sinus. No change from EKG in June 2023  Recent Labs: 01/24/2021: ALT 17 03/12/2021: BUN 16; Creatinine, Ser 0.91; Potassium 4.0; Sodium 142   Lipid Panel    Component Value Date/Time   CHOL 200 (H) 01/24/2021 0953   TRIG 124 01/24/2021 0953   HDL 53 01/24/2021 0953   CHOLHDL 3.8 01/24/2021 0953   CHOLHDL 4.8 10/08/2013 1130   VLDL 35 10/08/2013 1130   LDLCALC 125 (H) 01/24/2021 0953     Wt Readings from Last 3 Encounters:  10/19/21 204 lb 9.6 oz (92.8 kg)  10/04/21 213 lb 12.8 oz (97 kg)  03/12/21 212 lb 3.2 oz (96.3 kg)     Assessment and Plan:   1. Unstable angina: Her chest pain is concerning for unstable angina. Given her risk factors for CAD including DM, HTN, HLD, former tobacco abuse and strong family history of CAD, I think cardiac catheterization is indicated.  I have reviewed the risks, indications, and alternatives to cardiac catheterization, possible angioplasty, and stenting with the patient. Risks include but are not limited to bleeding, infection, vascular injury, stroke, myocardial infection, arrhythmia, kidney injury, radiation-related injury in the case of prolonged fluoroscopy use, emergency  cardiac surgery, and death. The patient understands the risks of serious complication is 1-2 in 7616 with diagnostic cardiac cath and 1-2% or less with angioplasty/stenting. -I will arrange a cardiac cath on 10/24/21 at [facility] with Dr. Ali Lowe. Dr. Tamala Julian is not available for caths next week and my outpatient cath schedule is full next week.  -BMET and CBC today -I will start Imdur 30 mg daily and make sure she has SL NTG to use as needed.  -Continue ASA and statin -Will arrange an echo to assess LVEF, exclude structural heart disease.    Labs/ tests ordered today include:   Orders Placed This Encounter  Procedures   Basic metabolic panel   CBC   EKG 12-Lead   ECHOCARDIOGRAM COMPLETE     Disposition:   F/U with Dr. Tamala Julian 3-4 weeks post cath   Signed, Lauree Chandler, MD 10/19/2021 4:32 PM    [facility] [place], [place], [place]  [address] Phone: [phone]; Fax: [phone]

## 2021-10-20 LAB — CBC
Hematocrit: 41 % (ref 34.0–46.6)
Hemoglobin: 13.6 g/dL (ref 11.1–15.9)
MCH: 27.5 pg (ref 26.6–33.0)
MCHC: 33.2 g/dL (ref 31.5–35.7)
MCV: 83 fL (ref 79–97)
Platelets: 271 10*3/uL (ref 150–450)
RBC: 4.95 x10E6/uL (ref 3.77–5.28)
RDW: 13.5 % (ref 11.7–15.4)
WBC: 4.5 10*3/uL (ref 3.4–10.8)

## 2021-10-20 LAB — BASIC METABOLIC PANEL
BUN/Creatinine Ratio: 16 (ref 12–28)
BUN: 17 mg/dL (ref 8–27)
CO2: 25 mmol/L (ref 20–29)
Calcium: 10.3 mg/dL (ref 8.7–10.3)
Chloride: 102 mmol/L (ref 96–106)
Creatinine, Ser: 1.07 mg/dL — ABNORMAL HIGH (ref 0.57–1.00)
Glucose: 114 mg/dL — ABNORMAL HIGH (ref 70–99)
Potassium: 4.1 mmol/L (ref 3.5–5.2)
Sodium: 141 mmol/L (ref 134–144)
eGFR: 57 mL/min/{1.73_m2} — ABNORMAL LOW (ref >59)

## 2021-10-23 ENCOUNTER — Telehealth: Payer: Self-pay | Admitting: *Deleted

## 2021-10-23 NOTE — Telephone Encounter (Addendum)
Cardiac Catheterization scheduled at [facility] for: Wednesday October 24, 2021 8:30 AM Arrival time and place: [place] Entrance A at: 6:30 AM   Nothing to eat after midnight prior to procedure, clear liquids until 5 AM day of procedure.  Medication instructions: -Hold:  Metformin-day of procedure and 48 hours post procedure  HCTZ/Mobic-day before and day of procedure-per protocol GFR 57 -Except hold medications usual morning medications can be taken with sips of water including aspirin 81 mg.  Confirmed patient has responsible adult to drive home post procedure and be with patient first 24 hours after arriving home.  Patient reports no new symptoms concerning for COVID-19 in the past 10 days.  Reviewed procedure instructions with patient.

## 2021-10-24 ENCOUNTER — Encounter (HOSPITAL_COMMUNITY)
Admission: RE | Disposition: A | Discharge status: Home / Self Care | Payer: Self-pay | Source: Home / Self Care | Attending: Internal Medicine

## 2021-10-24 ENCOUNTER — Ambulatory Visit (HOSPITAL_COMMUNITY)
Admission: RE | Admit: 2021-10-24 | Discharge: 2021-10-24 | Disposition: A | Discharge status: Home / Self Care | Payer: 59 | Attending: Internal Medicine | Admitting: Internal Medicine

## 2021-10-24 DIAGNOSIS — Z7982 Long term (current) use of aspirin: Secondary | ICD-10-CM | POA: Diagnosis not present

## 2021-10-24 DIAGNOSIS — E785 Hyperlipidemia, unspecified: Secondary | ICD-10-CM | POA: Diagnosis not present

## 2021-10-24 DIAGNOSIS — Z87891 Personal history of nicotine dependence: Secondary | ICD-10-CM | POA: Diagnosis not present

## 2021-10-24 DIAGNOSIS — E079 Disorder of thyroid, unspecified: Secondary | ICD-10-CM | POA: Diagnosis not present

## 2021-10-24 DIAGNOSIS — Z7984 Long term (current) use of oral hypoglycemic drugs: Secondary | ICD-10-CM | POA: Diagnosis not present

## 2021-10-24 DIAGNOSIS — Z8249 Family history of ischemic heart disease and other diseases of the circulatory system: Secondary | ICD-10-CM | POA: Insufficient documentation

## 2021-10-24 DIAGNOSIS — E119 Type 2 diabetes mellitus without complications: Secondary | ICD-10-CM | POA: Insufficient documentation

## 2021-10-24 DIAGNOSIS — K219 Gastro-esophageal reflux disease without esophagitis: Secondary | ICD-10-CM | POA: Diagnosis not present

## 2021-10-24 DIAGNOSIS — I200 Unstable angina: Secondary | ICD-10-CM | POA: Insufficient documentation

## 2021-10-24 DIAGNOSIS — I10 Essential (primary) hypertension: Secondary | ICD-10-CM | POA: Diagnosis not present

## 2021-10-24 DIAGNOSIS — I209 Angina pectoris, unspecified: Secondary | ICD-10-CM | POA: Diagnosis not present

## 2021-10-24 DIAGNOSIS — Z79899 Other long term (current) drug therapy: Secondary | ICD-10-CM | POA: Diagnosis not present

## 2021-10-24 HISTORY — PX: LEFT HEART CATH AND CORONARY ANGIOGRAPHY: CATH118249

## 2021-10-24 LAB — GLUCOSE, CAPILLARY: Glucose-Capillary: 154 mg/dL — ABNORMAL HIGH (ref 70–99)

## 2021-10-24 SURGERY — LEFT HEART CATH AND CORONARY ANGIOGRAPHY
Anesthesia: LOCAL

## 2021-10-24 MED ORDER — SODIUM CHLORIDE 0.9 % IV SOLN: 250.0000 mL | INTRAVENOUS | Status: DC | PRN

## 2021-10-24 MED ORDER — SODIUM CHLORIDE 0.9 % IV SOLN
INTRAVENOUS | Status: DC
Start: 2021-10-24 — End: 2021-10-24

## 2021-10-24 MED ORDER — SODIUM CHLORIDE 0.9% FLUSH: 3.0000 mL | INTRAVENOUS | Status: DC | PRN

## 2021-10-24 MED ORDER — LABETALOL HCL 5 MG/ML IV SOLN: 10.0000 mg | INTRAVENOUS | Status: DC | PRN

## 2021-10-24 MED ORDER — ACETAMINOPHEN 325 MG PO TABS: 650.0000 mg | ORAL_TABLET | ORAL | Status: DC | PRN

## 2021-10-24 MED ORDER — ASPIRIN 81 MG PO CHEW: 81.0000 mg | CHEWABLE_TABLET | ORAL | Status: DC

## 2021-10-24 MED ORDER — HEPARIN SODIUM (PORCINE) 1000 UNIT/ML IJ SOLN
INTRAMUSCULAR | Status: AC
  Filled 2021-10-24: qty 10

## 2021-10-24 MED ORDER — IOHEXOL 350 MG/ML SOLN
INTRAVENOUS | Status: DC | PRN
  Administered 2021-10-24: 45 mL

## 2021-10-24 MED ORDER — VERAPAMIL HCL 2.5 MG/ML IV SOLN
INTRAVENOUS | Status: AC
  Filled 2021-10-24: qty 2

## 2021-10-24 MED ORDER — SODIUM CHLORIDE 0.9% FLUSH: 3.0000 mL | Freq: Two times a day (BID) | INTRAVENOUS | Status: DC

## 2021-10-24 MED ORDER — FENTANYL CITRATE (PF) 100 MCG/2ML IJ SOLN
INTRAMUSCULAR | Status: AC
  Filled 2021-10-24: qty 2

## 2021-10-24 MED ORDER — LIDOCAINE HCL (PF) 1 % IJ SOLN
INTRAMUSCULAR | Status: AC
  Filled 2021-10-24: qty 30

## 2021-10-24 MED ORDER — SODIUM CHLORIDE 0.9 % WEIGHT BASED INFUSION
3.0000 mL/kg/h | INTRAVENOUS | Status: AC
  Administered 2021-10-24: 3 mL/kg/h via INTRAVENOUS

## 2021-10-24 MED ORDER — LIDOCAINE HCL (PF) 1 % IJ SOLN
INTRAMUSCULAR | Status: DC | PRN
  Administered 2021-10-24: 2 mL

## 2021-10-24 MED ORDER — SODIUM CHLORIDE 0.9 % WEIGHT BASED INFUSION: 1.0000 mL/kg/h | INTRAVENOUS | Status: DC

## 2021-10-24 MED ORDER — FENTANYL CITRATE (PF) 100 MCG/2ML IJ SOLN
INTRAMUSCULAR | Status: DC | PRN
  Administered 2021-10-24 (×2): 25 ug via INTRAVENOUS

## 2021-10-24 MED ORDER — HEPARIN SODIUM (PORCINE) 1000 UNIT/ML IJ SOLN
INTRAMUSCULAR | Status: DC | PRN
  Administered 2021-10-24: 5000 [IU] via INTRAVENOUS

## 2021-10-24 MED ORDER — MIDAZOLAM HCL 2 MG/2ML IJ SOLN
INTRAMUSCULAR | Status: DC | PRN
  Administered 2021-10-24 (×2): 1 mg via INTRAVENOUS

## 2021-10-24 MED ORDER — VERAPAMIL HCL 2.5 MG/ML IV SOLN
INTRAVENOUS | Status: DC | PRN
  Administered 2021-10-24: 5 mL via INTRA_ARTERIAL

## 2021-10-24 MED ORDER — HEPARIN (PORCINE) IN NACL 1000-0.9 UT/500ML-% IV SOLN
INTRAVENOUS | Status: DC | PRN
  Administered 2021-10-24 (×2): 500 mL

## 2021-10-24 MED ORDER — HEPARIN (PORCINE) IN NACL 1000-0.9 UT/500ML-% IV SOLN
INTRAVENOUS | Status: AC
  Filled 2021-10-24: qty 500

## 2021-10-24 MED ORDER — HYDRALAZINE HCL 20 MG/ML IJ SOLN: 10.0000 mg | INTRAMUSCULAR | Status: DC | PRN

## 2021-10-24 MED ORDER — ONDANSETRON HCL 4 MG/2ML IJ SOLN: 4.0000 mg | Freq: Four times a day (QID) | INTRAMUSCULAR | Status: DC | PRN

## 2021-10-24 MED ORDER — MIDAZOLAM HCL 2 MG/2ML IJ SOLN
INTRAMUSCULAR | Status: AC
  Filled 2021-10-24: qty 2

## 2021-10-24 SURGICAL SUPPLY — 10 items
BAND ZEPHYR COMPRESS 30 LONG (HEMOSTASIS) ×1 IMPLANT
CATH DIAG 6FR JR4 (CATHETERS) ×1 IMPLANT
CATH DIAG 6FR PIGTAIL ANGLED (CATHETERS) ×1 IMPLANT
CATH INFINITI 6F FL3.5 (CATHETERS) ×1 IMPLANT
GLIDESHEATH SLEND SS 6F .021 (SHEATH) ×1 IMPLANT
KIT HEART LEFT (KITS) ×2 IMPLANT
PACK CARDIAC CATHETERIZATION (CUSTOM PROCEDURE TRAY) ×2 IMPLANT
TRANSDUCER W/STOPCOCK (MISCELLANEOUS) ×2 IMPLANT
TUBING CIL FLEX 10 FLL-RA (TUBING) ×2 IMPLANT
WIRE EMERALD 3MM-J .035X260CM (WIRE) ×1 IMPLANT

## 2021-10-24 NOTE — Discharge Instructions (Signed)
Restart metformin October 26, 2021

## 2021-10-24 NOTE — Interval H&P Note (Signed)
History and Physical Interval Note:  10/24/2021 6:47 AM  Stacy Bryan  has presented today for surgery, with the diagnosis of unstable angina.  The various methods of treatment have been discussed with the patient and family. After consideration of risks, benefits and other options for treatment, the patient has consented to  Procedure(s): LEFT HEART CATH AND CORONARY ANGIOGRAPHY (N/A) as a surgical intervention.  The patient's history has been reviewed, patient examined, no change in status, stable for surgery.  I have reviewed the patient's chart and labs.  Questions were answered to the patient's satisfaction.    Cath Lab Visit (complete for each Cath Lab visit)  Clinical Evaluation Leading to the Procedure:   ACS: No.  Non-ACS:    Anginal Classification: CCS II  Anti-ischemic medical therapy: Maximal Therapy (2 or more classes of medications)  Non-Invasive Test Results: No non-invasive testing performed  Prior CABG: No previous CABG        Early Osmond

## 2021-10-25 ENCOUNTER — Other Ambulatory Visit: Payer: Self-pay | Admitting: Family Medicine

## 2021-10-25 ENCOUNTER — Encounter (HOSPITAL_COMMUNITY): Payer: Self-pay | Admitting: Internal Medicine

## 2021-10-25 DIAGNOSIS — E1169 Type 2 diabetes mellitus with other specified complication: Secondary | ICD-10-CM

## 2021-10-25 MED FILL — Lidocaine HCl Local Preservative Free (PF) Inj 1%: INTRAMUSCULAR | Qty: 30 | Status: AC

## 2021-10-26 ENCOUNTER — Encounter: Payer: Self-pay | Admitting: Interventional Cardiology

## 2021-10-26 MED ORDER — ACCU-CHEK GUIDE ME W/DEVICE KIT: PACK | 0 refills | Status: AC

## 2021-10-30 ENCOUNTER — Encounter: Payer: Self-pay | Admitting: Cardiovascular Disease

## 2021-10-30 ENCOUNTER — Ambulatory Visit (INDEPENDENT_AMBULATORY_CARE_PROVIDER_SITE_OTHER): Payer: 59

## 2021-10-30 ENCOUNTER — Encounter: Payer: Self-pay | Admitting: Family Medicine

## 2021-10-30 ENCOUNTER — Ambulatory Visit: Payer: 59 | Admitting: Podiatry

## 2021-10-30 ENCOUNTER — Ambulatory Visit (HOSPITAL_COMMUNITY): Payer: 59

## 2021-10-30 ENCOUNTER — Encounter: Payer: Self-pay | Admitting: Podiatry

## 2021-10-30 DIAGNOSIS — M778 Other enthesopathies, not elsewhere classified: Secondary | ICD-10-CM

## 2021-10-30 DIAGNOSIS — G5792 Unspecified mononeuropathy of left lower limb: Secondary | ICD-10-CM | POA: Diagnosis not present

## 2021-10-30 DIAGNOSIS — E785 Hyperlipidemia, unspecified: Secondary | ICD-10-CM | POA: Insufficient documentation

## 2021-10-30 DIAGNOSIS — R609 Edema, unspecified: Secondary | ICD-10-CM | POA: Insufficient documentation

## 2021-10-30 NOTE — Telephone Encounter (Signed)
Called patient to discuss her mychart message. Informed patient that if she is not wanting to go to ED, then she needs to see her PCP. Patient's heart cath was clean and we are waiting to see what her echo shows. Informed patient that so far her symptoms do not seem to be caused by her heart, will see more when patient's echo is done. Patient has follow up with Katrina Stack NP in August. Will forward to Dr. Tamala Julian for further advisement.

## 2021-10-30 NOTE — Progress Notes (Signed)
Subjective:  Patient ID: Stacy Bryan, female    DOB: 01-27-1956,  MRN: [id number] HPI Chief Complaint  Patient presents with   Foot Pain    Dorsal midfoot left - knot x 1 year, can't wear shoes that cross that area, tried ice   Diabetes    Last a1c was 7.5   New Patient (Initial Visit)    66 y.o. female presents with the above complaint.   ROS: Denies fever chills nausea vomiting muscle aches pains calf pain back pain chest pain shortness of breath.  Past Medical History:  Diagnosis Date   Abnormal Pap smear    Allergy    Anemia    GERD (gastroesophageal reflux disease)    Hyperlipidemia    Hypertension    Thyroid disease    Vertigo    Past Surgical History:  Procedure Laterality Date   ABDOMINAL HYSTERECTOMY  1997   total   FOOT SURGERY     both feet  20 yrs ago   LEFT HEART CATH AND CORONARY ANGIOGRAPHY N/A 10/24/2021   Procedure: LEFT HEART CATH AND CORONARY ANGIOGRAPHY;  Surgeon: Early Osmond, MD;  Location: [facility];  Service: Cardiovascular;  Laterality: N/A;   OVARY SURGERY  1980's   removal of R ovary (cyst)    Current Outpatient Medications:    albuterol (VENTOLIN HFA) 108 (90 Base) MCG/ACT inhaler, Inhale 2 puffs into the lungs every 6 (six) hours as needed for wheezing or shortness of breath., Disp: 8 g, Rfl: 2   aspirin EC 81 MG tablet, Take 81 mg by mouth daily., Disp: , Rfl:    atorvastatin (LIPITOR) 40 MG tablet, Take 1 tablet (40 mg total) by mouth daily., Disp: 90 tablet, Rfl: 1   Blood Glucose Monitoring Suppl (ACCU-CHEK GUIDE ME) w/Device KIT, Use daily, Disp: 1 kit, Rfl: 0   calcium carbonate (TITRALAC) 420 MG CHEW chewable tablet, Chew by mouth., Disp: , Rfl:    Cholecalciferol (VITAMIN D-3 PO), Take 400 Units by mouth daily., Disp: , Rfl:    cyclobenzaprine (FLEXERIL) 10 MG tablet, Take 1 tablet (10 mg total) by mouth 2 (two) times daily as needed for muscle spasms., Disp: 60 tablet, Rfl: 1   diclofenac Sodium (VOLTAREN) 1 % GEL,  Apply 4 g topically 4 (four) times daily as needed., Disp: 500 g, Rfl: 6   DULoxetine (CYMBALTA) 60 MG capsule, Take 1 capsule (60 mg total) by mouth daily., Disp: 30 capsule, Rfl: 3   ezetimibe (ZETIA) 10 MG tablet, Take 1 tablet (10 mg total) by mouth daily., Disp: 90 tablet, Rfl: 3   fish oil-omega-3 fatty acids 1000 MG capsule, Take 2 g by mouth daily., Disp: , Rfl:    fluticasone (FLONASE) 50 MCG/ACT nasal spray, Place 2 sprays into both nostrils daily., Disp: 16 g, Rfl: 6   glucose blood (ACCU-CHEK GUIDE) test strip, Use as instructed daily, Disp: 100 each, Rfl: 3   hydrochlorothiazide (MICROZIDE) 12.5 MG capsule, Take 1 capsule (12.5 mg total) by mouth daily., Disp: 90 capsule, Rfl: 1   HYDROcodone-acetaminophen (NORCO/VICODIN) 5-325 MG tablet, Take 1 tablet by mouth every 4 (four) hours as needed., Disp: 10 tablet, Rfl: 0   isosorbide mononitrate (IMDUR) 30 MG 24 hr tablet, Take 1 tablet (30 mg total) by mouth daily., Disp: 90 tablet, Rfl: 3   levothyroxine (EUTHYROX) 75 MCG tablet, Take 1 tablet (75 mcg total) by mouth daily before breakfast., Disp: 90 tablet, Rfl: 1   magnesium oxide (MAG-OX) 400 MG tablet,  Take 400 mg by mouth daily., Disp: , Rfl:    meclizine (ANTIVERT) 25 MG tablet, Take 1 tablet (25 mg total) by mouth 3 (three) times daily as needed for dizziness., Disp: 60 tablet, Rfl: 1   meloxicam (MOBIC) 15 MG tablet, Take 1 tablet by mouth once daily, Disp: 30 tablet, Rfl: 2   metFORMIN (GLUCOPHAGE) 500 MG tablet, Take 2 tablets (1,000 mg total) by mouth 2 (two) times daily with a meal., Disp: 360 tablet, Rfl: 1   metroNIDAZOLE (FLAGYL) 500 MG tablet, Take 1 tablet (500 mg total) by mouth 2 (two) times daily., Disp: 14 tablet, Rfl: 0   nitroGLYCERIN (NITROSTAT) 0.4 MG SL tablet, Place 1 tablet (0.4 mg total) under the tongue every 5 (five) minutes as needed for chest pain., Disp: 25 tablet, Rfl: 3   OVER THE COUNTER MEDICATION, Chlor Tab, two tablets daily for allergies., Disp: ,  Rfl:    Vitamin D, Ergocalciferol, (DRISDOL) 1.25 MG (50000 UNIT) CAPS capsule, Take 1 capsule (50,000 Units total) by mouth every 7 (seven) days., Disp: 16 capsule, Rfl: 0  Current Facility-Administered Medications:    0.9 %  sodium chloride infusion, 500 mL, Intravenous, Once, Armbruster, Carlota Raspberry, MD  No Known Allergies Review of Systems Objective:  There were no vitals filed for this visit.  General: Well developed, nourished, in no acute distress, alert and oriented x3   Dermatological: Skin is warm, dry and supple bilateral. Nails x 10 are well maintained; remaining integument appears unremarkable at this time. There are no open sores, no preulcerative lesions, no rash or signs of infection present.  Vascular: Dorsalis Pedis artery and Posterior Tibial artery pedal pulses are 2/4 bilateral with immedate capillary fill time. Pedal hair growth present. No varicosities and no lower extremity edema present bilateral.   Neruologic: Grossly intact via light touch bilateral. Vibratory intact via tuning fork bilateral. Protective threshold with Semmes Wienstein monofilament intact to all pedal sites bilateral. Patellar and Achilles deep tendon reflexes 2+ bilateral. No Babinski or clonus noted bilateral.   Musculoskeletal: No gross boney pedal deformities bilateral. No pain, crepitus, or limitation noted with foot and ankle range of motion bilateral. Muscular strength 5/5 in all groups tested bilateral.  Mild tenderness on palpation of the first and second tarsometatarsal joints with radiating pain along the deep peroneal nerve distally.  Gait: Unassisted, Nonantalgic.    Radiographs:  Radiographs taken today demonstrate an osseously mature individual with very early subchondral sclerosis and joint space narrowing of the tarsometatarsal joints first and second left foot.  No dorsal spurring noted but she does have prominent dorsal bones there.  Assessment & Plan:   Assessment:  Neuritis  Plan: Discussed topical anti-inflammatories discussed surgical and nonsurgical treatment plans.  Follow-up with her as needed.     Jeanenne Licea T. [place], [place]

## 2021-11-12 ENCOUNTER — Encounter: Payer: Self-pay | Admitting: Family Medicine

## 2021-11-13 ENCOUNTER — Ambulatory Visit (HOSPITAL_COMMUNITY): Payer: 59 | Attending: Cardiovascular Disease

## 2021-11-13 DIAGNOSIS — I200 Unstable angina: Secondary | ICD-10-CM | POA: Insufficient documentation

## 2021-11-13 LAB — ECHOCARDIOGRAM COMPLETE
Area-P 1/2: 2.99 cm2
P 1/2 time: 516 msec
S' Lateral: 2.8 cm

## 2021-11-14 ENCOUNTER — Telehealth: Payer: Self-pay

## 2021-11-14 NOTE — Telephone Encounter (Signed)
Spoke with patient to discuss Dr. Tamala Julian recommending she come in for OV to be evaluated.  Patient states she has been experiencing stress at work and feels this may be contributing to her chest discomfort/tightness. She states "Monday was really bad, yesterday was a little better, and I'm OK today." Patient agreeable to rescheduling appt for 8/16 with Ambrose Pancoast, NP to 11/21/21 at 10:20AM with Dr. Tamala Julian.  Patient states she had her echocardiogram performed yesterday. Will forward to Dr. Tamala Julian to review.

## 2021-11-17 NOTE — Progress Notes (Deleted)
Cardiology Office Note:    Date:  11/17/2021   ID:  Stacy Bryan, DOB 02-25-1956, MRN [id number]  PCP:  Charlott Rakes, MD  Cardiologist:  Sinclair Grooms, MD   Referring MD: Charlott Rakes, MD   No chief complaint on file.   History of Present Illness:    Stacy Bryan is a 66 y.o. female with a hx of hyperlipidemia, hypertension, chest pain without obstructive CAD (INOCA).  Imdur started prior to cath. Still having CP at rest and with exertion.  ***  Past Medical History:  Diagnosis Date   Abnormal Pap smear    Allergy    Anemia    GERD (gastroesophageal reflux disease)    Hyperlipidemia    Hypertension    Thyroid disease    Vertigo     Past Surgical History:  Procedure Laterality Date   ABDOMINAL HYSTERECTOMY  1997   total   FOOT SURGERY     both feet  20 yrs ago   LEFT HEART CATH AND CORONARY ANGIOGRAPHY N/A 10/24/2021   Procedure: LEFT HEART CATH AND CORONARY ANGIOGRAPHY;  Surgeon: Early Osmond, MD;  Location: [facility];  Service: Cardiovascular;  Laterality: N/A;   OVARY SURGERY  1980's   removal of R ovary (cyst)    Current Medications: No outpatient medications have been marked as taking for the 11/21/21 encounter (Appointment) with Belva Crome, MD.   Current Facility-Administered Medications for the 11/21/21 encounter (Appointment) with Belva Crome, MD  Medication   0.9 %  sodium chloride infusion     Allergies:   Patient has no known allergies.   Social History   Socioeconomic History   Marital status: Single    Spouse name: Not on file   Number of children: Not on file   Years of education: Not on file   Highest education level: Not on file  Occupational History   Not on file  Tobacco Use   Smoking status: Former    Types: Cigarettes    Quit date: 06/02/2006    Years since quitting: 15.4   Smokeless tobacco: Never  Vaping Use   Vaping Use: Never used  Substance and Sexual Activity   Alcohol use: Yes    Comment:  occassionally   Drug use: No   Sexual activity: Not on file  Other Topics Concern   Not on file  Social History Narrative   Not on file   Social Determinants of Health   Financial Resource Strain: Not on file  Food Insecurity: Not on file  Transportation Needs: Not on file  Physical Activity: Not on file  Stress: Not on file  Social Connections: Not on file     Family History: The patient's family history includes Asthma in her father and sister; Breast cancer in her sister; Cancer in her maternal grandmother; Diabetes in her sister and sister; Gout in her sister; Heart disease in her father and sister; Hyperlipidemia in her father; Hypertension in her mother; Stomach cancer in her sister; Thyroid disease in her sister; Ulcers in her sister. There is no history of Colon cancer, Esophageal cancer, Liver cancer, Pancreatic cancer, or Rectal cancer.  ROS:   Please see the history of present illness.    *** All other systems reviewed and are negative.  EKGs/Labs/Other Studies Reviewed:    The following studies were reviewed today:  ECHOCARDIOGRAM 11/13/2021: IMPRESSIONS   1. Left ventricular ejection fraction, by estimation, is 60 to 65%. The  left  ventricle has normal function. The left ventricle has no regional  wall motion abnormalities. Left ventricular diastolic parameters are  consistent with Grade I diastolic  dysfunction (impaired relaxation).   2. Right ventricular systolic function is normal. The right ventricular  size is normal.   3. The mitral valve is normal in structure. No evidence of mitral valve  regurgitation. No evidence of mitral stenosis.   4. The aortic valve is normal in structure. Aortic valve regurgitation is  mild. No aortic stenosis is present.   5. Aortic dilatation noted. There is mild dilatation of the ascending  aorta, measuring 37 mm.   6. The inferior vena cava is normal in size with greater than 50%  respiratory variability, suggesting right  atrial pressure of 3 mmHg.   CARDIAC CATH 10/19/2021: Slow RCA and LAD filling with no obstructive CAD, and GI w/u recommended. Diagnostic Dominance: Right   EKG:  EKG ***  Recent Labs: 01/24/2021: ALT 17 10/19/2021: BUN 17; Creatinine, Ser 1.07; Hemoglobin 13.6; Platelets 271; Potassium 4.1; Sodium 141  Recent Lipid Panel    Component Value Date/Time   CHOL 200 (H) 01/24/2021 0953   TRIG 124 01/24/2021 0953   HDL 53 01/24/2021 0953   CHOLHDL 3.8 01/24/2021 0953   CHOLHDL 4.8 10/08/2013 1130   VLDL 35 10/08/2013 1130   LDLCALC 125 (H) 01/24/2021 0953    Physical Exam:    VS:  There were no vitals taken for this visit.    Wt Readings from Last 3 Encounters:  10/24/21 204 lb (92.5 kg)  10/19/21 204 lb 9.6 oz (92.8 kg)  10/04/21 213 lb 12.8 oz (97 kg)     GEN: ***. No acute distress HEENT: Normal NECK: No JVD. LYMPHATICS: No lymphadenopathy CARDIAC: *** murmur. RRR *** gallop, or edema. VASCULAR: *** Normal Pulses. No bruits. RESPIRATORY:  Clear to auscultation without rales, wheezing or rhonchi  ABDOMEN: Soft, non-tender, non-distended, No pulsatile mass, MUSCULOSKELETAL: No deformity  SKIN: Warm and dry NEUROLOGIC:  Alert and oriented x 3 PSYCHIATRIC:  Normal affect   ASSESSMENT:    No diagnosis found. PLAN:    In order of problems listed above:  ***   Medication Adjustments/Labs and Tests Ordered: Current medicines are reviewed at length with the patient today.  Concerns regarding medicines are outlined above.  No orders of the defined types were placed in this encounter.  No orders of the defined types were placed in this encounter.   There are no Patient Instructions on file for this visit.   Signed, Sinclair Grooms, MD  11/17/2021 9:56 PM    [place]

## 2021-11-21 ENCOUNTER — Ambulatory Visit: Payer: 59 | Admitting: Interventional Cardiology

## 2021-11-23 ENCOUNTER — Ambulatory Visit: Payer: 59 | Admitting: Nurse Practitioner

## 2021-11-28 ENCOUNTER — Ambulatory Visit: Payer: 59 | Admitting: Nurse Practitioner

## 2021-11-29 ENCOUNTER — Telehealth: Payer: Self-pay

## 2021-11-29 NOTE — Telephone Encounter (Signed)
LVM for patient to return phone call.  

## 2021-12-10 ENCOUNTER — Telehealth: Payer: Self-pay | Admitting: Family Medicine

## 2021-12-10 ENCOUNTER — Ambulatory Visit: Payer: 59 | Attending: Family Medicine | Admitting: Family Medicine

## 2021-12-10 ENCOUNTER — Encounter: Payer: Self-pay | Admitting: Family Medicine

## 2021-12-10 DIAGNOSIS — Z029 Encounter for administrative examinations, unspecified: Secondary | ICD-10-CM | POA: Diagnosis not present

## 2021-12-10 DIAGNOSIS — R42 Dizziness and giddiness: Secondary | ICD-10-CM | POA: Diagnosis not present

## 2021-12-10 DIAGNOSIS — R0789 Other chest pain: Secondary | ICD-10-CM

## 2021-12-10 DIAGNOSIS — F439 Reaction to severe stress, unspecified: Secondary | ICD-10-CM

## 2021-12-10 NOTE — Progress Notes (Signed)
Virtual Visit via Telephone Note  I connected with Stacy Bryan, on 12/10/2021 at 3:09 PM by telephone and verified that I am speaking with the correct person using two identifiers.   Consent: I discussed the limitations, risks, security and privacy concerns of performing an evaluation and management service by telephone and the availability of in person appointments. I also discussed with the patient that there may be a patient responsible charge related to this service. The patient expressed understanding and agreed to proceed.   Location of Patient: Work  Tree surgeon: Home   Persons participating in Telemedicine visit: Ellery P Pooley Dr. Margarita Rana     History of Present Illness: Stacy Bryan is a 66 y.o. year old female with  a history of Type 2 DM (A1c 6.6), Hypertension, chronic low back pain, hypothyroidism (secondary to radioactive treatment for multinodular goiter), chronic venous insufficiency, vertigo. Today she is requesting completion of her FMLA paperwork due to her vertigo which is sometimes severe to the point that she cannot make it to work. 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  Since her last visit she has undergone work-up by cardiology due to chest pain.  Cardiac cath was negative for CAD, echo revealed EF of 60 to 65%, grade 1 DD. Thinks she has been stressed and this could explain her chest pain.  She did undergo counseling session with the previous LCSW but felt that she was "not seasoned".  She is open to being referred again for counseling. Past Medical History:  Diagnosis Date   Abnormal Pap smear    Allergy    Anemia    GERD (gastroesophageal reflux disease)    Hyperlipidemia    Hypertension    Thyroid disease    Vertigo    No Known Allergies  Current Outpatient Medications on File Prior to Visit  Medication Sig Dispense Refill   albuterol (VENTOLIN HFA) 108 (90 Base) MCG/ACT  inhaler Inhale 2 puffs into the lungs every 6 (six) hours as needed for wheezing or shortness of breath. 8 g 2   aspirin EC 81 MG tablet Take 81 mg by mouth daily.     atorvastatin (LIPITOR) 40 MG tablet Take 1 tablet (40 mg total) by mouth daily. 90 tablet 1   Blood Glucose Monitoring Suppl (ACCU-CHEK GUIDE ME) w/Device KIT Use daily 1 kit 0   calcium carbonate (TITRALAC) 420 MG CHEW chewable tablet Chew by mouth.     Cholecalciferol (VITAMIN D-3 PO) Take 400 Units by mouth daily.     cyclobenzaprine (FLEXERIL) 10 MG tablet Take 1 tablet (10 mg total) by mouth 2 (two) times daily as needed for muscle spasms. 60 tablet 1   diclofenac Sodium (VOLTAREN) 1 % GEL Apply 4 g topically 4 (four) times daily as needed. 500 g 6   DULoxetine (CYMBALTA) 60 MG capsule Take 1 capsule (60 mg total) by mouth daily. 30 capsule 3   ezetimibe (ZETIA) 10 MG tablet Take 1 tablet (10 mg total) by mouth daily. 90 tablet 3   fish oil-omega-3 fatty acids 1000 MG capsule Take 2 g by mouth daily.     fluticasone (FLONASE) 50 MCG/ACT nasal spray Place 2 sprays into both nostrils daily. 16 g 6   glucose blood (ACCU-CHEK GUIDE) test strip Use as instructed daily 100 each 3   hydrochlorothiazide (MICROZIDE) 12.5 MG capsule Take 1 capsule (12.5 mg total) by mouth daily. 90 capsule 1   HYDROcodone-acetaminophen (NORCO/VICODIN) 5-325 MG  tablet Take 1 tablet by mouth every 4 (four) hours as needed. 10 tablet 0   isosorbide mononitrate (IMDUR) 30 MG 24 hr tablet Take 1 tablet (30 mg total) by mouth daily. 90 tablet 3   levothyroxine (EUTHYROX) 75 MCG tablet Take 1 tablet (75 mcg total) by mouth daily before breakfast. 90 tablet 1   magnesium oxide (MAG-OX) 400 MG tablet Take 400 mg by mouth daily.     meclizine (ANTIVERT) 25 MG tablet Take 1 tablet (25 mg total) by mouth 3 (three) times daily as needed for dizziness. 60 tablet 1   meloxicam (MOBIC) 15 MG tablet Take 1 tablet by mouth once daily 30 tablet 2   metFORMIN (GLUCOPHAGE)  500 MG tablet Take 2 tablets (1,000 mg total) by mouth 2 (two) times daily with a meal. 360 tablet 1   metroNIDAZOLE (FLAGYL) 500 MG tablet Take 1 tablet (500 mg total) by mouth 2 (two) times daily. 14 tablet 0   nitroGLYCERIN (NITROSTAT) 0.4 MG SL tablet Place 1 tablet (0.4 mg total) under the tongue every 5 (five) minutes as needed for chest pain. 25 tablet 3   OVER THE COUNTER MEDICATION Chlor Tab, two tablets daily for allergies.     Vitamin D, Ergocalciferol, (DRISDOL) 1.25 MG (50000 UNIT) CAPS capsule Take 1 capsule (50,000 Units total) by mouth every 7 (seven) days. 16 capsule 0   Current Facility-Administered Medications on File Prior to Visit  Medication Dose Route Frequency Provider Last Rate Last Admin   0.9 %  sodium chloride infusion  500 mL Intravenous Once Armbruster, Carlota Raspberry, MD        ROS: See HPI  Observations/Objective: Awake, alert, oriented x3 Not in acute distress Normal mood      Latest Ref Rng & Units 10/19/2021    4:24 PM 03/12/2021   12:22 PM 01/24/2021    9:53 AM  CMP  Glucose 70 - 99 mg/dL 114  112  109   BUN 8 - 27 mg/dL _0 Creatinine 0.57 - 1.00 mg/dL 1.07  0.91  0.91   Sodium 134 - 144 mmol/L 141  142  141   Potassium 3.5 - 5.2 mmol/L 4.1  4.0  4.2   Chloride 96 - 106 mmol/L 102  105  103   CO2 20 - 29 mmol/L _1 Calcium 8.7 - 10.3 mg/dL 10.3  9.4  9.7   Total Protein 6.0 - 8.5 g/dL   7.0   Total Bilirubin 0.0 - 1.2 mg/dL   0.4   Alkaline Phos 44 - 121 IU/L   103   AST 0 - 40 IU/L   11   ALT 0 - 32 IU/L   17     Lipid Panel     Component Value Date/Time   CHOL 200 (H) 01/24/2021 0953   TRIG 124 01/24/2021 0953   HDL 53 01/24/2021 0953   CHOLHDL 3.8 01/24/2021 0953   CHOLHDL 4.8 10/08/2013 1130   VLDL 35 10/08/2013 1130   LDLCALC 125 (H) 01/24/2021 0953   LABVLDL 22 01/24/2021 0953    Lab Results  Component Value Date   HGBA1C 7.5 (A) 10/04/2021    Assessment and Plan: 1. Stress Could explain her chest  pain She will benefit from counseling She is open to being referred and I have referred her to the LCSW who will be reaching out to her  2. Chest tightness Cardiac work-up negative for ischemia Anxiety  and stress could be playing a role  3. Encounters for administrative purpose Completed FMLA paperwork for vertigo  4.  Vertigo Stable with intermittent flares Continue meclizine  Follow Up Instructions: Keep upcoming appointment with me next month   I discussed the assessment and treatment plan with the patient. The patient was provided an opportunity to ask questions and all were answered. The patient agreed with the plan and demonstrated an understanding of the instructions.   The patient was advised to call back or seek an in-person evaluation if the symptoms worsen or if the condition fails to improve as anticipated.     I provided 11 minutes total of non-face-to-face time during this encounter.   Charlott Rakes, MD, FAAFP. [facility] [place], [place]   12/10/2021, 3:09 PM

## 2021-12-10 NOTE — Telephone Encounter (Signed)
Patient is under a lot of stress which has been causing her to have chest pains.  Currently under cardiology care.  She is open to psychotherapy.  Thank you.

## 2021-12-12 NOTE — Telephone Encounter (Signed)
Please feel free to send her a MyChart message as she does respond to that.  Thanks.

## 2021-12-12 NOTE — Telephone Encounter (Signed)
LCSWA called patient today to introduce herself and to assess patients' mental health needs. Patient did not answer the phone. LCSWA was not able to leave a message, the option wasn't available. Patient was referred by PCP for patient being under a lot of stress.

## 2021-12-15 DIAGNOSIS — M62838 Other muscle spasm: Secondary | ICD-10-CM | POA: Insufficient documentation

## 2021-12-15 DIAGNOSIS — E1165 Type 2 diabetes mellitus with hyperglycemia: Secondary | ICD-10-CM | POA: Insufficient documentation

## 2021-12-15 DIAGNOSIS — R519 Headache, unspecified: Secondary | ICD-10-CM | POA: Insufficient documentation

## 2022-01-09 ENCOUNTER — Ambulatory Visit: Payer: 59 | Admitting: Family Medicine

## 2022-01-15 DIAGNOSIS — I200 Unstable angina: Secondary | ICD-10-CM | POA: Insufficient documentation

## 2022-01-15 DIAGNOSIS — Z23 Encounter for immunization: Secondary | ICD-10-CM | POA: Insufficient documentation

## 2022-01-15 DIAGNOSIS — E119 Type 2 diabetes mellitus without complications: Secondary | ICD-10-CM

## 2022-01-15 HISTORY — DX: Type 2 diabetes mellitus without complications: E11.9

## 2022-02-07 NOTE — Progress Notes (Signed)
Office Visit    Patient Name: Stacy Bryan Date of Encounter: 02/07/2022  Primary Care Provider:  Charlott Rakes, MD Primary Cardiologist:  Sinclair Grooms, MD Primary Electrophysiologist: None  Chief Complaint    Stacy Bryan is a 66 y.o. female with PMH of HTN, HLD, DM type II, anemia, thyroid disease, GERD, former tobacco abuse, family history of CAD, nonobstructive CAD by [place] who presents today for follow-up of coronary artery disease.  Past Medical History    Past Medical History:  Diagnosis Date   Abnormal Pap smear    Allergy    Anemia    GERD (gastroesophageal reflux disease)    Hyperlipidemia    Hypertension    Thyroid disease    Vertigo    Past Surgical History:  Procedure Laterality Date   ABDOMINAL HYSTERECTOMY  1997   total   FOOT SURGERY     both feet  20 yrs ago   LEFT HEART CATH AND CORONARY ANGIOGRAPHY N/A 10/24/2021   Procedure: LEFT HEART CATH AND CORONARY ANGIOGRAPHY;  Surgeon: Early Osmond, MD;  Location: [facility];  Service: Cardiovascular;  Laterality: N/A;   OVARY SURGERY  1980's   removal of R ovary (cyst)    Allergies  No Known Allergies  History of Present Illness    Stacy Bryan  is a 66 year old female with the above mention past medical history who presents today for follow-up of coronary artery disease.  Stacy Bryan was recently seen by Dr. Tamala Julian in 2022 for complaint of chest pain.  She described pain as shooting and sticking discomfort.  She was sent for cardiac CTA was ordered but not completed by patient.  She was seen by Dr. Angelena Form in office for complaint of chest pain.  Due to her family history and ongoing chest pain she was sent for left heart cath for further evaluation.  LHC was performed 10/2021 that revealed nonobstructive CAD with recommendation of medical management.  She also underwent 2D echo that showed EF of 60-65%, normal LV function with no RWMA, grade 1 DD, normal RV function, no MV stenosis or  sclerosis, with mildly dilated aorta of 37 mm.  Stacy Bryan presents today for 53-monthfollow-up alone.  Since last being seen in the office patient reports that she has been doing well with 1 or 2 occasions chest pain occurring at the rectal axilla extending to the pectoralis and up to the neck.  She has discontinued her Imdur due to increased headaches after 1 dose.  We discussed the importance of communicating with her cardiologist regarding any medications that I discontinue.  Her blood pressure today was well controlled at 124/78 and heart rate was 65 bpm.  She is currently not exercising but is planning to retire next week from her job.  She was congratulated for this accomplishment and is hoping to participate in a walking program.  Patient denies chest pain, palpitations, dyspnea, PND, orthopnea, nausea, vomiting, dizziness, syncope, edema, weight gain, or early satiety.  Home Medications    Current Outpatient Medications  Medication Sig Dispense Refill   albuterol (VENTOLIN HFA) 108 (90 Base) MCG/ACT inhaler Inhale 2 puffs into the lungs every 6 (six) hours as needed for wheezing or shortness of breath. 8 g 2   aspirin EC 81 MG tablet Take 81 mg by mouth daily.     atorvastatin (LIPITOR) 40 MG tablet Take 1 tablet (40 mg total) by mouth daily. 90 tablet 1  Blood Glucose Monitoring Suppl (ACCU-CHEK GUIDE ME) w/Device KIT Use daily 1 kit 0   calcium carbonate (TITRALAC) 420 MG CHEW chewable tablet Chew by mouth.     Cholecalciferol (VITAMIN D-3 PO) Take 400 Units by mouth daily.     cyclobenzaprine (FLEXERIL) 10 MG tablet Take 1 tablet (10 mg total) by mouth 2 (two) times daily as needed for muscle spasms. 60 tablet 1   diclofenac Sodium (VOLTAREN) 1 % GEL Apply 4 g topically 4 (four) times daily as needed. 500 g 6   DULoxetine (CYMBALTA) 60 MG capsule Take 1 capsule (60 mg total) by mouth daily. 30 capsule 3   ezetimibe (ZETIA) 10 MG tablet Take 1 tablet (10 mg total) by mouth daily. 90 tablet  3   fish oil-omega-3 fatty acids 1000 MG capsule Take 2 g by mouth daily.     fluticasone (FLONASE) 50 MCG/ACT nasal spray Place 2 sprays into both nostrils daily. 16 g 6   glucose blood (ACCU-CHEK GUIDE) test strip Use as instructed daily 100 each 3   hydrochlorothiazide (MICROZIDE) 12.5 MG capsule Take 1 capsule (12.5 mg total) by mouth daily. 90 capsule 1   HYDROcodone-acetaminophen (NORCO/VICODIN) 5-325 MG tablet Take 1 tablet by mouth every 4 (four) hours as needed. 10 tablet 0   isosorbide mononitrate (IMDUR) 30 MG 24 hr tablet Take 1 tablet (30 mg total) by mouth daily. 90 tablet 3   levothyroxine (EUTHYROX) 75 MCG tablet Take 1 tablet (75 mcg total) by mouth daily before breakfast. 90 tablet 1   magnesium oxide (MAG-OX) 400 MG tablet Take 400 mg by mouth daily.     meclizine (ANTIVERT) 25 MG tablet Take 1 tablet (25 mg total) by mouth 3 (three) times daily as needed for dizziness. 60 tablet 1   meloxicam (MOBIC) 15 MG tablet Take 1 tablet by mouth once daily 30 tablet 2   metFORMIN (GLUCOPHAGE) 500 MG tablet Take 2 tablets (1,000 mg total) by mouth 2 (two) times daily with a meal. 360 tablet 1   metroNIDAZOLE (FLAGYL) 500 MG tablet Take 1 tablet (500 mg total) by mouth 2 (two) times daily. 14 tablet 0   nitroGLYCERIN (NITROSTAT) 0.4 MG SL tablet Place 1 tablet (0.4 mg total) under the tongue every 5 (five) minutes as needed for chest pain. 25 tablet 3   OVER THE COUNTER MEDICATION Chlor Tab, two tablets daily for allergies.     Vitamin D, Ergocalciferol, (DRISDOL) 1.25 MG (50000 UNIT) CAPS capsule Take 1 capsule (50,000 Units total) by mouth every 7 (seven) days. 16 capsule 0   Current Facility-Administered Medications  Medication Dose Route Frequency Provider Last Rate Last Admin   0.9 %  sodium chloride infusion  500 mL Intravenous Once Armbruster, Carlota Raspberry, MD         Review of Systems  Please see the history of present illness.    (+) Chest pain (+) Chronic lower extremity  swelling  All other systems reviewed and are otherwise negative except as noted above.  Physical Exam    Wt Readings from Last 3 Encounters:  10/24/21 204 lb (92.5 kg)  10/19/21 204 lb 9.6 oz (92.8 kg)  10/04/21 213 lb 12.8 oz (97 kg)   JJ:[id number] were no vitals filed for this visit.,There is no height or weight on file to calculate BMI.  Constitutional:      Appearance: Healthy appearance. Not in distress.  Neck:     Vascular: JVD normal.  Pulmonary:     Effort: Pulmonary  effort is normal.     Breath sounds: No wheezing. No rales. Diminished in the bases Cardiovascular:     Normal rate. Regular rhythm. Normal S1. Normal S2.      Murmurs: There is no murmur.  Edema:    Peripheral edema absent.  Abdominal:     Palpations: Abdomen is soft non tender. There is no hepatomegaly.  Skin:    General: Skin is warm and dry.  Neurological:     General: No focal deficit present.     Mental Status: Alert and oriented to person, place and time.     Cranial Nerves: Cranial nerves are intact.  EKG/LABS/Other Studies Reviewed    ECG personally reviewed by me today -none completed today     Lab Results  Component Value Date   WBC 4.5 10/19/2021   HGB 13.6 10/19/2021   HCT 41.0 10/19/2021   MCV 83 10/19/2021   PLT 271 10/19/2021   Lab Results  Component Value Date   CREATININE 1.07 (H) 10/19/2021   BUN 17 10/19/2021   NA 141 10/19/2021   K 4.1 10/19/2021   CL 102 10/19/2021   CO2 25 10/19/2021   Lab Results  Component Value Date   ALT 17 01/24/2021   AST 11 01/24/2021   ALKPHOS 103 01/24/2021   BILITOT 0.4 01/24/2021   Lab Results  Component Value Date   CHOL 200 (H) 01/24/2021   HDL 53 01/24/2021   LDLCALC 125 (H) 01/24/2021   TRIG 124 01/24/2021   CHOLHDL 3.8 01/24/2021    Lab Results  Component Value Date   HGBA1C 7.5 (A) 10/04/2021    Assessment & Plan    1.  Nonobstructive CAD: -s/p LHC with nonobstructive CAD with recommendation for medical  management. -Today patient reports occasional chest pain that occurs on the right axilla side that resolves with rest. She reports not taking her Imdur following 1 dose and headaches. -We discussed the importance of continuing medication to see if it would prevent recurrence of chest pain. -She will resume Imdur 30 mg daily -Continue GDMT with ASA 81 mg, Lipitor 40 mg daily, Zetia 10 mg daily  2.  Essential hypertension: -Patient's blood pressure today was well controlled at 128/78 -Continue Microzide 12.5 mg  3.  Hyperlipidemia: -Patient's last LDL was 53 and goal of less than 70 -Continue to Zetia and Lipitor as noted above  4.  Ascending aortic dilation: -2D echo completed and revealed mildly dilated aorta of 37 mm.  5. Disposition: Follow-up with Belva Crome III, MD or APP in 6 months   Medication Adjustments/Labs and Tests Ordered: Current medicines are reviewed at length with the patient today.  Concerns regarding medicines are outlined above.   Signed, Mable Fill, Marissa Nestle, NP 02/07/2022, 5:29 PM [facility]  Note:  This document was prepared using Dragon voice recognition software and may include unintentional dictation errors.

## 2022-02-08 ENCOUNTER — Ambulatory Visit: Payer: 59 | Attending: Nurse Practitioner | Admitting: Nurse Practitioner

## 2022-02-08 ENCOUNTER — Encounter: Payer: Self-pay | Admitting: Nurse Practitioner

## 2022-02-08 VITALS — BP 124/78 | HR 62 | Ht 70.0 in | Wt 206.8 lb

## 2022-02-08 DIAGNOSIS — I2510 Atherosclerotic heart disease of native coronary artery without angina pectoris: Secondary | ICD-10-CM

## 2022-02-08 DIAGNOSIS — I77810 Thoracic aortic ectasia: Secondary | ICD-10-CM

## 2022-02-08 DIAGNOSIS — I10 Essential (primary) hypertension: Secondary | ICD-10-CM

## 2022-02-08 DIAGNOSIS — E785 Hyperlipidemia, unspecified: Secondary | ICD-10-CM

## 2022-02-08 NOTE — Patient Instructions (Signed)
Medication Instructions:  Your physician recommends that you continue on your current medications as directed. Please refer to the Current Medication list given to you today.  *If you need a refill on your cardiac medications before your next appointment, please call your pharmacy*    Follow-Up: At [facility], you and your health needs are our priority.  As part of our continuing mission to provide you with exceptional heart care, we have created designated Provider Care Teams.  These Care Teams include your primary Cardiologist (physician) and Advanced Practice Providers (APPs -  Physician Assistants and Nurse Practitioners) who all work together to provide you with the care you need, when you need it.  Your next appointment:   6 month(s)  The format for your next appointment:   In Person  Provider:   Ambrose Pancoast, NP

## 2022-04-03 ENCOUNTER — Other Ambulatory Visit: Payer: Self-pay | Admitting: Family Medicine

## 2022-04-03 DIAGNOSIS — E1169 Type 2 diabetes mellitus with other specified complication: Secondary | ICD-10-CM

## 2022-04-03 DIAGNOSIS — E785 Hyperlipidemia, unspecified: Secondary | ICD-10-CM

## 2022-04-06 ENCOUNTER — Other Ambulatory Visit: Payer: Self-pay | Admitting: Physician Assistant

## 2022-04-06 DIAGNOSIS — E1169 Type 2 diabetes mellitus with other specified complication: Secondary | ICD-10-CM

## 2022-04-16 DIAGNOSIS — R1031 Right lower quadrant pain: Secondary | ICD-10-CM | POA: Insufficient documentation

## 2022-06-12 ENCOUNTER — Other Ambulatory Visit: Payer: Self-pay | Admitting: Physician Assistant

## 2022-06-12 DIAGNOSIS — E890 Postprocedural hypothyroidism: Secondary | ICD-10-CM

## 2022-06-19 ENCOUNTER — Other Ambulatory Visit: Payer: Self-pay

## 2022-07-05 ENCOUNTER — Other Ambulatory Visit: Payer: Self-pay | Admitting: Family Medicine

## 2022-07-05 DIAGNOSIS — Z1231 Encounter for screening mammogram for malignant neoplasm of breast: Secondary | ICD-10-CM

## 2022-07-10 ENCOUNTER — Ambulatory Visit: Payer: 59

## 2022-07-10 ENCOUNTER — Ambulatory Visit
Admission: RE | Admit: 2022-07-10 | Discharge: 2022-07-10 | Disposition: A | Discharge status: Home / Self Care | Payer: No Typology Code available for payment source | Source: Ambulatory Visit | Attending: Family Medicine

## 2022-07-10 DIAGNOSIS — Z1231 Encounter for screening mammogram for malignant neoplasm of breast: Secondary | ICD-10-CM

## 2022-07-12 ENCOUNTER — Other Ambulatory Visit: Payer: Self-pay | Admitting: Family Medicine

## 2022-07-12 DIAGNOSIS — E1169 Type 2 diabetes mellitus with other specified complication: Secondary | ICD-10-CM

## 2022-08-02 ENCOUNTER — Encounter: Payer: Self-pay | Admitting: Gastroenterology

## 2022-08-07 ENCOUNTER — Other Ambulatory Visit: Payer: Self-pay | Admitting: Family Medicine

## 2022-08-07 DIAGNOSIS — E1169 Type 2 diabetes mellitus with other specified complication: Secondary | ICD-10-CM

## 2022-08-08 NOTE — Telephone Encounter (Signed)
Refused the atorvastatin 20 mg refill request because this dose was discontinued 01/26/2021 due to a dose increase.

## 2022-09-10 ENCOUNTER — Other Ambulatory Visit: Payer: Self-pay | Admitting: Physician Assistant

## 2022-09-10 DIAGNOSIS — E1159 Type 2 diabetes mellitus with other circulatory complications: Secondary | ICD-10-CM

## 2022-09-11 ENCOUNTER — Other Ambulatory Visit: Payer: Self-pay | Admitting: Physician Assistant

## 2022-09-11 DIAGNOSIS — E1159 Type 2 diabetes mellitus with other circulatory complications: Secondary | ICD-10-CM

## 2022-09-19 ENCOUNTER — Encounter: Payer: Self-pay | Admitting: Gastroenterology

## 2022-10-10 ENCOUNTER — Ambulatory Visit (AMBULATORY_SURGERY_CENTER): Payer: No Typology Code available for payment source

## 2022-10-10 VITALS — Ht 70.0 in | Wt 206.0 lb

## 2022-10-10 DIAGNOSIS — Z86010 Personal history of colonic polyps: Secondary | ICD-10-CM

## 2022-10-10 MED ORDER — NA SULFATE-K SULFATE-MG SULF 17.5-3.13-1.6 GM/177ML PO SOLN: 1.0000 | Freq: Once | ORAL | 0 refills | Status: AC

## 2022-10-10 NOTE — Progress Notes (Signed)
No egg or soy allergy known to patient  No issues known to pt with past sedation with any surgeries or procedures Patient denies ever being told they had issues or difficulty with intubation  No FH of Malignant Hyperthermia Pt is not on diet pills Pt is not on  home 02  Pt is not on blood thinners  Pt denies issues with constipation  No A fib or A flutter Have any cardiac testing pending-- yearly check up end of July  LOA: independent  Prep: Gol[place]   Patient's chart reviewed by Cathlyn Parsons CNRA prior to previsit and patient appropriate for the [facility].  Previsit completed and red dot placed by patient's name on their procedure day (on provider's schedule).     PV competed with patient. Prep instructions sent via mychart and home address.

## 2022-10-14 ENCOUNTER — Other Ambulatory Visit: Payer: Self-pay | Admitting: Physician Assistant

## 2022-10-14 DIAGNOSIS — E1169 Type 2 diabetes mellitus with other specified complication: Secondary | ICD-10-CM

## 2022-10-15 NOTE — Telephone Encounter (Signed)
This pt is not a pt at [facility]- refused

## 2022-10-18 ENCOUNTER — Ambulatory Visit: Payer: No Typology Code available for payment source | Admitting: Nurse Practitioner

## 2022-10-22 ENCOUNTER — Encounter: Payer: Self-pay | Admitting: Gastroenterology

## 2022-10-25 ENCOUNTER — Ambulatory Visit: Payer: No Typology Code available for payment source | Attending: Nurse Practitioner | Admitting: Physician Assistant

## 2022-10-25 ENCOUNTER — Encounter: Payer: Self-pay | Admitting: Physician Assistant

## 2022-10-25 ENCOUNTER — Ambulatory Visit (INDEPENDENT_AMBULATORY_CARE_PROVIDER_SITE_OTHER): Payer: No Typology Code available for payment source

## 2022-10-25 ENCOUNTER — Other Ambulatory Visit: Payer: Self-pay | Admitting: Physician Assistant

## 2022-10-25 VITALS — BP 130/80 | HR 67 | Ht 70.0 in | Wt 212.4 lb

## 2022-10-25 DIAGNOSIS — R002 Palpitations: Secondary | ICD-10-CM

## 2022-10-25 DIAGNOSIS — R0602 Shortness of breath: Secondary | ICD-10-CM | POA: Diagnosis not present

## 2022-10-25 DIAGNOSIS — R072 Precordial pain: Secondary | ICD-10-CM

## 2022-10-25 DIAGNOSIS — I77810 Thoracic aortic ectasia: Secondary | ICD-10-CM | POA: Diagnosis not present

## 2022-10-25 DIAGNOSIS — I10 Essential (primary) hypertension: Secondary | ICD-10-CM

## 2022-10-25 HISTORY — DX: Thoracic aortic ectasia: I77.810

## 2022-10-25 MED ORDER — AMLODIPINE BESYLATE 2.5 MG PO TABS: 2.5000 mg | ORAL_TABLET | Freq: Every day | ORAL | 3 refills | Status: DC

## 2022-10-25 NOTE — Patient Instructions (Signed)
Medication Instructions:  Your physician has recommended you make the following change in your medication:   START Amlodipine 2.5 mg taking 1 daily   *If you need a refill on your cardiac medications before your next appointment, please call your pharmacy*   Lab Work: TODAY:  BMET & PRO BNP  If you have labs (blood work) drawn today and your tests are completely normal, you will receive your results only by: MyChart Message (if you have MyChart) OR A paper copy in the mail If you have any lab test that is abnormal or we need to change your treatment, we will call you to review the results.   Testing/Procedures: Christena Deem- Long Term Monitor Instructions  Your physician has requested you wear a ZIO patch monitor for 14 days.  This is a single patch monitor. [employer] supplies one patch monitor per enrollment. Additional stickers are not available. Please do not apply patch if you will be having a Nuclear Stress Test,  Echocardiogram, Cardiac CT, MRI, or Chest Xray during the period you would be wearing the  monitor. The patch cannot be worn during these tests. You cannot remove and re-apply the  ZIO XT patch monitor.  Your ZIO patch monitor will be mailed 3 day USPS to your address on file. It may take 3-5 days  to receive your monitor after you have been enrolled.  Once you have received your monitor, please review the enclosed instructions. Your monitor  has already been registered assigning a specific monitor serial # to you.  Billing and Patient Assistance Program Information  We have supplied [employer] with any of your insurance information on file for billing purposes. [employer] offers a sliding scale Patient Assistance Program for patients that do not have  insurance, or whose insurance does not completely cover the cost of the ZIO monitor.  You must apply for the Patient Assistance Program to qualify for this discounted rate.  To apply, please call [employer] at [phone], select  option 4, select option 2, ask to apply for  Patient Assistance Program. Meredeth Ide will ask your household income, and how many people  are in your household. They will quote your out-of-pocket cost based on that information.  [employer] will also be able to set up a 14-month, interest-free payment plan if needed.  Applying the monitor   Shave hair from upper left chest.  Hold abrader disc by orange tab. Rub abrader in 40 strokes over the upper left chest as  indicated in your monitor instructions.  Clean area with 4 enclosed alcohol pads. Let dry.  Apply patch as indicated in monitor instructions. Patch will be placed under collarbone on left  side of chest with arrow pointing upward.  Rub patch adhesive wings for 2 minutes. Remove white label marked "1". Remove the white  label marked "2". Rub patch adhesive wings for 2 additional minutes.  While looking in a mirror, press and release button in center of patch. A small green light will  flash 3-4 times. This will be your only indicator that the monitor has been turned on.  Do not shower for the first 24 hours. You may shower after the first 24 hours.  Press the button if you feel a symptom. You will hear a small click. Record Date, Time and  Symptom in the Patient Logbook.  When you are ready to remove the patch, follow instructions on the last 2 pages of Patient  Logbook. Stick patch monitor onto the last page of Patient Logbook.  Place Patient Logbook in the blue and white box. Use locking tab on box and tape box closed  securely. The blue and white box has prepaid postage on it. Please place it in the mailbox as  soon as possible. Your physician should have your test results approximately 7 days after the  monitor has been mailed back to [facility].  Call [facility] Customer Care at [phone] if you have questions regarding  your ZIO XT patch monitor. Call them immediately if you see an orange light blinking on your  monitor.   If your monitor falls off in less than 4 days, contact our Monitor department at [phone].  If your monitor becomes loose or falls off after 4 days call [employer] at [phone] for  suggestions on securing your monitor    Follow-Up: At [facility], you and your health needs are our priority.  As part of our continuing mission to provide you with exceptional heart care, we have created designated Provider Care Teams.  These Care Teams include your primary Cardiologist (physician) and Advanced Practice Providers (APPs -  Physician Assistants and Nurse Practitioners) who all work together to provide you with the care you need, when you need it.  We recommend signing up for the patient portal called "MyChart".  Sign up information is provided on this After Visit Summary.  MyChart is used to connect with patients for Virtual Visits (Telemedicine).  Patients are able to view lab/test results, encounter notes, upcoming appointments, etc.  Non-urgent messages can be sent to your provider as well.   To learn more about what you can do with MyChart, go to [email or web address].    Your next appointment:   6 month(s)  Provider:   Orbie Pyo, MD     Other Instructions

## 2022-10-25 NOTE — Assessment & Plan Note (Addendum)
She had cardiac catheterization in July 2023 that demonstrated no CAD.  She has had improvement in symptoms with nitrates.  However, she cannot tolerate nitrates secondary to headache.  She does not have any GI related symptoms.  Therefore, question if she has vasospasm versus microvascular ischemia.  I will place her on amlodipine 2.5 mg daily.  She knows to contact us if she cannot tolerate this medication.  Since she does not have coronary artery disease, she does not need to remain on aspirin.  She can stop this.  Continue nitroglycerin as needed.  Follow-up in 6 months.

## 2022-10-25 NOTE — Assessment & Plan Note (Signed)
Blood pressure is well-controlled.  Continue HCTZ 12.5 mg daily.

## 2022-10-25 NOTE — Assessment & Plan Note (Signed)
Question if her symptoms could be related to an arrhythmia given her rapid palpitations.  Obtain 14-day ZIO XT monitor.

## 2022-10-25 NOTE — Progress Notes (Signed)
Cardiology Office Note:    Date:  10/25/2022  ID:  Stacy Bryan, DOB 07/23/1955, MRN [id number] PCP: Patient, No Pcp Per  [facility] Providers Cardiologist:  Orbie Pyo, MD       Patient Profile:      Chest pain  LHC 10/24/2021: No CAD ?vasospasm - Sx's improved w Nitrates Dilated aorta TTE 11/13/2021: EF 60-65, no RWMA, G1 DD, normal RVSF, mild AI, mild dilation of ascending aorta (37 mm), RAP Hypertension Hyperlipidemia Diabetes mellitus  GERD Ex-smoker  FHx of CAD       History of Present Illness:   Stacy Bryan is a 67 y.o. female who returns for follow up of chest pain, possible vasospasm. She was last seen by Stacy Searing, NP in 01/2022.  She is here alone.  She had to stop taking isosorbide a few months ago secondary to headache.  She had less chest symptoms while taking this.  She has occasional left-sided chest discomfort that usually is preceded by shortness of breath, then dizziness.  It is not related to exertion.  She notes a rapid heartbeat around the time of her symptoms.  She has not had syncope.  She has shortness of breath with exertion that seems to be getting worse.  She has slept on 4 pillows chronically.  She has not had PND.  She does note occasional ankle edema.  Review of Systems  Gastrointestinal:  Negative for hematochezia and melena.  Genitourinary:  Negative for hematuria.   see HPI    Studies Reviewed:   EKG Interpretation Date/Time:  Friday October 25 2022 11:04:36 EDT Ventricular Rate:  67 PR Interval:  180 QRS Duration:  64 QT Interval:  408 QTC Calculation: 431 R Axis:   -10  Text Interpretation: Normal sinus rhythm Low voltage QRS Poor R wave progression Non-specific ST-t changes No significant change since last ECG Confirmed by Tereso Newcomer [phone]) on 10/25/2022 11:11:32 AM   Risk Assessment/Calculations:           Physical Exam:   VS:  BP 130/80   Pulse 67   Ht 5\' 10"  (1.778 m)   Wt 212 lb 6.4 oz (96.3 kg)   SpO2 94%    BMI 30.48 kg/m    Wt Readings from Last 3 Encounters:  10/25/22 212 lb 6.4 oz (96.3 kg)  10/10/22 206 lb (93.4 kg)  02/08/22 206 lb 12.8 oz (93.8 kg)    Constitutional:      Appearance: Healthy appearance. Not in distress.  Neck:     Vascular: JVD normal.  Pulmonary:     Breath sounds: Normal breath sounds. No wheezing. No rales.  Cardiovascular:     Normal rate. Regular rhythm.     Murmurs: There is no murmur.  Edema:    Peripheral edema present.    Ankle: bilateral trace edema of the ankle. Abdominal:     Palpations: Abdomen is soft.      ASSESSMENT AND PLAN:   Precordial pain She had cardiac catheterization in July 2023 that demonstrated no CAD.  She has had improvement in symptoms with nitrates.  However, she cannot tolerate nitrates secondary to headache.  She does not have any GI related symptoms.  Therefore, question if she has vasospasm versus microvascular ischemia.  I will place her on amlodipine 2.5 mg daily.  She knows to contact us if she cannot tolerate this medication.  Since she does not have coronary artery disease, she does not need to remain on aspirin.  She can stop this.  Continue nitroglycerin as needed.  Follow-up in 6 months.  SOB (shortness of breath) She notes dyspnea exertion that seems to be getting worse.  She had mild diastolic dysfunction on echocardiogram in August 2023.  She does not really have significant signs of volume excess on exam aside from trace ankle edema.  Obtain BMET, BNP today.  If BNP significantly elevated, consider repeat echocardiogram and changing HCTZ to furosemide.  Palpitations Question if her symptoms could be related to an arrhythmia given her rapid palpitations.  Obtain 14-day ZIO XT monitor.  Ascending aorta dilation (HCC) 37 mm on echocardiogram in August 2023.  Consider repeat echocardiogram in 1 year.  HTN (hypertension), benign Blood pressure is well-controlled.  Continue HCTZ 12.5 mg daily.    Dispo:  Return in  about 6 months (around 04/27/2023) for Routine Follow Up with Dr. Lynnette Caffey, or Tereso Newcomer, PA-C.  Signed, Tereso Newcomer, PA-C

## 2022-10-25 NOTE — Progress Notes (Unsigned)
Enrolled patient for a 14 day Zio XT monitor to be mailed to patients home  Thukkani to read

## 2022-10-25 NOTE — Assessment & Plan Note (Signed)
37 mm on echocardiogram in August 2023.  Consider repeat echocardiogram in 1 year.

## 2022-10-25 NOTE — Assessment & Plan Note (Signed)
She notes dyspnea exertion that seems to be getting worse.  She had mild diastolic dysfunction on echocardiogram in August 2023.  She does not really have significant signs of volume excess on exam aside from trace ankle edema.  Obtain BMET, BNP today.  If BNP significantly elevated, consider repeat echocardiogram and changing HCTZ to furosemide.

## 2022-10-26 LAB — PRO B NATRIURETIC PEPTIDE: NT-Pro BNP: 43 pg/mL (ref 0–301)

## 2022-10-26 LAB — BASIC METABOLIC PANEL WITH GFR
BUN/Creatinine Ratio: 18 (ref 12–28)
BUN: 17 mg/dL (ref 8–27)
CO2: 24 mmol/L (ref 20–29)
Calcium: 9.7 mg/dL (ref 8.7–10.3)
Chloride: 101 mmol/L (ref 96–106)
Creatinine, Ser: 0.95 mg/dL (ref 0.57–1.00)
Glucose: 128 mg/dL — ABNORMAL HIGH (ref 70–99)
Potassium: 4.3 mmol/L (ref 3.5–5.2)
Sodium: 141 mmol/L (ref 134–144)
eGFR: 65 mL/min/1.73

## 2022-10-28 ENCOUNTER — Telehealth: Payer: Self-pay | Admitting: Gastroenterology

## 2022-10-28 NOTE — Telephone Encounter (Signed)
Card MD seen Friday 712 24 and Card MD placed her  on a heart monitor. Next OV is in 2 weeks per pt. Advised pt that she would need to get Card MD clearance before having colonoscopy which is currently scheduled this 10/30/22. Current colon is to be canceled and pt to call and rescheduled after seeing Card MD and getting the OK to do so.

## 2022-10-28 NOTE — Telephone Encounter (Signed)
Patient called stated she is scheduled for 10/30/22 for a colonoscopy and she recently experienced chest pain and now has a heart monitor. Would like to know if she can proceed with the procedure.

## 2022-10-30 ENCOUNTER — Encounter: Payer: No Typology Code available for payment source | Admitting: Gastroenterology

## 2022-11-02 DIAGNOSIS — R0602 Shortness of breath: Secondary | ICD-10-CM | POA: Diagnosis not present

## 2022-11-02 DIAGNOSIS — R002 Palpitations: Secondary | ICD-10-CM | POA: Diagnosis not present

## 2022-11-02 DIAGNOSIS — R072 Precordial pain: Secondary | ICD-10-CM | POA: Diagnosis not present

## 2022-11-06 ENCOUNTER — Encounter: Payer: Self-pay | Admitting: *Deleted

## 2022-11-22 ENCOUNTER — Encounter: Payer: Self-pay | Admitting: Physician Assistant

## 2022-12-09 ENCOUNTER — Ambulatory Visit: Payer: No Typology Code available for payment source | Admitting: Cardiology

## 2023-01-28 ENCOUNTER — Encounter: Payer: No Typology Code available for payment source | Admitting: Gastroenterology

## 2023-02-19 ENCOUNTER — Other Ambulatory Visit: Payer: Self-pay | Admitting: Family Medicine

## 2023-02-19 DIAGNOSIS — E890 Postprocedural hypothyroidism: Secondary | ICD-10-CM

## 2023-03-22 ENCOUNTER — Other Ambulatory Visit: Payer: Self-pay | Admitting: Cardiovascular Disease

## 2023-05-11 NOTE — Progress Notes (Signed)
 Cardiology Office Note:   Date:  05/11/2023  ID:  Stacy Bryan, DOB 06-Aug-1955, MRN [id number] PCP:  Patient, No Pcp Per  [facility] Providers Cardiologist:  Wendel Haws, MD Referring MD: No ref. provider found  Chief Complaint/Reason for Referral: Cardiology follow-up ASSESSMENT:    1. Chest pain, unspecified type   2. Type 2 diabetes mellitus with complication, without long-term current use of insulin (HCC)   3. CKD (chronic kidney disease) stage 2, GFR 60-89 ml/min   4. Hypertension associated with diabetes (HCC)   5. Hyperlipidemia associated with type 2 diabetes mellitus (HCC)   6. BMI 30.0-30.9,adult     PLAN:   In order of problems listed above: Chest pain: Underwent coronary angiography which demonstrated no disease.  Because her symptoms improved with nitrates this suggested perhaps the possibility of esophageal spasm.  An assessment for coronary vasospasm could not be performed at the time of cardiac catheterization.  If symptoms cannot be controlled with amlodipine  then she requires a GI evaluation as esophageal spasm can present as chest pain without any other GI symptoms. Type 2 diabetes mellitus: Continue aspirin ,; start Jardiance 10 mg daily losartan 12.5 mg daily. CKD stage II: Start Jardiance and losartan for renal protection. Hyperlipidemia: LDL recently was less than 60; will check LP(a) today. Elevated BMI: Given history of diabetes; will refer to pharmacy for recommendations regarding GLP-1 receptor agonist therapy to decrease her risk for future myocardial infarction.         Dispo:  No follow-ups on file.      Medication Adjustments/Labs and Tests Ordered: Current medicines are reviewed at length with the patient today.  Concerns regarding medicines are outlined above.  The following changes have been made:     Labs/tests ordered: No orders of the defined types were placed in this encounter.   Medication Changes: No orders of the defined  types were placed in this encounter.   Current medicines are reviewed at length with the patient today.  The patient  concerns regarding medicines.  I spent  minutes reviewing all clinical data during and prior to this visit including all relevant imaging studies, laboratories, clinical information from other health systems and prior notes from both Cardiology and other specialties, interviewing the patient, conducting a complete physical examination, and coordinating care in order to formulate a comprehensive and personalized evaluation and treatment plan.   History of Present Illness:      FOCUSED PROBLEM LIST:   Chest pain  LHC 10/24/2021: No CAD Assessment for coronary vasospasm not performed due to lack of IC acetylcholine Improvement in chest pain in response to nitrates>> coronary vasospasm versus esophageal spasm Dilated aorta TTE 11/13/2021: EF 60-65, no RWMA, G1 DD, normal RVSF, mild AI, mild dilation of ascending aorta (37 mm), RAP Hypertension Hyperlipidemia Diabetes mellitus  On metformin  CKD stage II GERD Ex-smoker  FHx of CAD  BMI 30  1/25: The patient was seen 6 months ago in our clinic catheterization which showed nonobstructive disease.  Her symptoms have improved with nitrates.  Cath did not include aspirin  for coronary vasospasm.  The fact that her chest pain improved with nitrates suggested either underlying coronary vasospasm versus esophageal spasm.  She could not tolerate nitrates due to headaches.  She was placed on amlodipine  2.5 mg a day.  Because of shortness of breath a BMP and BNP were ordered but apparently not performed..          Current Medications: No outpatient medications have been marked as  taking for the 05/12/23 encounter (Appointment) with Naysa Puskas K, MD.     Review of Systems:   Please see the history of present illness.    All other systems reviewed and are negative.     EKGs/Labs/Other Test Reviewed:   EKG: EKG from July 2024  demonstrates sinus rhythm with poor R wave progression  EKG Interpretation Date/Time:    Ventricular Rate:    PR Interval:    QRS Duration:    QT Interval:    QTC Calculation:   R Axis:      Text Interpretation:           Risk Assessment/Calculations:           Signed, Lurena MARLA Red, MD  05/11/2023 6:28 AM    [facility] [address] [place], [place], [place]  [address] Phone: [phone]; Fax: [phone]   Note:  This document was prepared using Dragon voice recognition software and may include unintentional dictation errors.

## 2023-05-12 ENCOUNTER — Encounter: Payer: Medicare Other | Admitting: Internal Medicine

## 2023-05-12 DIAGNOSIS — R079 Chest pain, unspecified: Secondary | ICD-10-CM

## 2023-05-12 DIAGNOSIS — E1159 Type 2 diabetes mellitus with other circulatory complications: Secondary | ICD-10-CM

## 2023-05-12 DIAGNOSIS — N182 Chronic kidney disease, stage 2 (mild): Secondary | ICD-10-CM

## 2023-05-12 DIAGNOSIS — E1169 Type 2 diabetes mellitus with other specified complication: Secondary | ICD-10-CM

## 2023-05-12 DIAGNOSIS — E118 Type 2 diabetes mellitus with unspecified complications: Secondary | ICD-10-CM

## 2023-05-12 DIAGNOSIS — Z6830 Body mass index (BMI) 30.0-30.9, adult: Secondary | ICD-10-CM

## 2023-10-08 NOTE — Progress Notes (Signed)
 This encounter was created in error - please disregard.

## 2023-10-16 ENCOUNTER — Other Ambulatory Visit: Payer: Self-pay | Admitting: Physician Assistant

## 2023-11-17 ENCOUNTER — Other Ambulatory Visit: Payer: Self-pay | Admitting: Internal Medicine
